# Patient Record
Sex: Female | Born: 1935 | Race: White | Hispanic: No | Smoking: Never smoker
Health system: Southern US, Community
[De-identification: ages and names within clinical notes are randomized; demographics above are authoritative.]

## PROBLEM LIST (undated history)

## (undated) DIAGNOSIS — M549 Dorsalgia, unspecified: Secondary | ICD-10-CM

## (undated) DIAGNOSIS — E0590 Thyrotoxicosis, unspecified without thyrotoxic crisis or storm: Secondary | ICD-10-CM

## (undated) DIAGNOSIS — I639 Cerebral infarction, unspecified: Secondary | ICD-10-CM

## (undated) DIAGNOSIS — G459 Transient cerebral ischemic attack, unspecified: Secondary | ICD-10-CM

## (undated) DIAGNOSIS — I350 Nonrheumatic aortic (valve) stenosis: Secondary | ICD-10-CM

## (undated) DIAGNOSIS — I10 Essential (primary) hypertension: Secondary | ICD-10-CM

## (undated) DIAGNOSIS — M810 Age-related osteoporosis without current pathological fracture: Secondary | ICD-10-CM

## (undated) DIAGNOSIS — J449 Chronic obstructive pulmonary disease, unspecified: Secondary | ICD-10-CM

## (undated) DIAGNOSIS — N259 Disorder resulting from impaired renal tubular function, unspecified: Secondary | ICD-10-CM

## (undated) DIAGNOSIS — R42 Dizziness and giddiness: Secondary | ICD-10-CM

## (undated) DIAGNOSIS — D649 Anemia, unspecified: Secondary | ICD-10-CM

## (undated) HISTORY — PX: BACK SURGERY: SHX140

## (undated) HISTORY — PX: OTHER SURGICAL HISTORY: SHX169

## (undated) HISTORY — DX: Essential (primary) hypertension: I10

## (undated) HISTORY — PX: SALIVARY GLAND SURGERY: SHX768

## (undated) HISTORY — DX: Dorsalgia, unspecified: M54.9

## (undated) HISTORY — DX: Thyrotoxicosis, unspecified without thyrotoxic crisis or storm: E05.90

## (undated) HISTORY — DX: Anemia, unspecified: D64.9

## (undated) HISTORY — DX: Disorder resulting from impaired renal tubular function, unspecified: N25.9

## (undated) HISTORY — DX: Dizziness and giddiness: R42

---

## 2000-06-21 ENCOUNTER — Encounter: Payer: Self-pay | Admitting: Emergency Medicine

## 2000-06-21 ENCOUNTER — Inpatient Hospital Stay (HOSPITAL_COMMUNITY): Admission: EM | Admit: 2000-06-21 | Discharge: 2000-06-24 | Payer: Self-pay | Admitting: Emergency Medicine

## 2000-06-22 ENCOUNTER — Encounter: Payer: Self-pay | Admitting: Family Medicine

## 2000-06-29 ENCOUNTER — Encounter: Payer: Self-pay | Admitting: *Deleted

## 2000-06-30 ENCOUNTER — Encounter: Payer: Self-pay | Admitting: Internal Medicine

## 2000-06-30 ENCOUNTER — Inpatient Hospital Stay (HOSPITAL_COMMUNITY): Admission: EM | Admit: 2000-06-30 | Discharge: 2000-07-02 | Payer: Self-pay | Admitting: *Deleted

## 2000-06-30 ENCOUNTER — Encounter: Payer: Self-pay | Admitting: Family Medicine

## 2001-05-17 ENCOUNTER — Ambulatory Visit (HOSPITAL_COMMUNITY): Admission: RE | Admit: 2001-05-17 | Discharge: 2001-05-17 | Payer: Self-pay | Admitting: Internal Medicine

## 2001-05-19 ENCOUNTER — Ambulatory Visit (HOSPITAL_COMMUNITY): Admission: RE | Admit: 2001-05-19 | Discharge: 2001-05-19 | Payer: Self-pay | Admitting: Cardiology

## 2001-05-19 ENCOUNTER — Encounter: Payer: Self-pay | Admitting: Cardiology

## 2001-05-21 ENCOUNTER — Encounter: Payer: Self-pay | Admitting: Cardiology

## 2001-05-21 ENCOUNTER — Ambulatory Visit (HOSPITAL_COMMUNITY): Admission: RE | Admit: 2001-05-21 | Discharge: 2001-05-21 | Payer: Self-pay | Admitting: Cardiology

## 2003-07-21 ENCOUNTER — Ambulatory Visit (HOSPITAL_COMMUNITY): Admission: RE | Admit: 2003-07-21 | Discharge: 2003-07-21 | Payer: Self-pay | Admitting: Family Medicine

## 2003-07-26 ENCOUNTER — Ambulatory Visit (HOSPITAL_COMMUNITY): Admission: RE | Admit: 2003-07-26 | Discharge: 2003-07-26 | Payer: Self-pay | Admitting: Family Medicine

## 2003-07-28 ENCOUNTER — Encounter (INDEPENDENT_AMBULATORY_CARE_PROVIDER_SITE_OTHER): Payer: Self-pay | Admitting: Specialist

## 2003-07-28 ENCOUNTER — Ambulatory Visit (HOSPITAL_COMMUNITY): Admission: RE | Admit: 2003-07-28 | Discharge: 2003-07-29 | Payer: Self-pay | Admitting: Interventional Radiology

## 2003-08-08 ENCOUNTER — Ambulatory Visit (HOSPITAL_COMMUNITY): Admission: RE | Admit: 2003-08-08 | Discharge: 2003-08-08 | Payer: Self-pay | Admitting: Interventional Radiology

## 2003-08-21 ENCOUNTER — Inpatient Hospital Stay (HOSPITAL_COMMUNITY): Admission: AD | Admit: 2003-08-21 | Discharge: 2003-08-22 | Payer: Self-pay | Admitting: Family Medicine

## 2004-03-11 ENCOUNTER — Ambulatory Visit (HOSPITAL_COMMUNITY): Admission: RE | Admit: 2004-03-11 | Discharge: 2004-03-11 | Payer: Self-pay | Admitting: Family Medicine

## 2004-03-11 ENCOUNTER — Ambulatory Visit (HOSPITAL_COMMUNITY): Admission: RE | Admit: 2004-03-11 | Discharge: 2004-03-11 | Payer: Self-pay | Admitting: Internal Medicine

## 2004-03-20 ENCOUNTER — Ambulatory Visit: Payer: Self-pay | Admitting: Internal Medicine

## 2004-03-28 ENCOUNTER — Ambulatory Visit: Payer: Self-pay | Admitting: Internal Medicine

## 2004-04-05 ENCOUNTER — Ambulatory Visit (HOSPITAL_COMMUNITY): Admission: RE | Admit: 2004-04-05 | Discharge: 2004-04-05 | Payer: Self-pay | Admitting: Internal Medicine

## 2004-04-05 ENCOUNTER — Ambulatory Visit: Payer: Self-pay | Admitting: Internal Medicine

## 2004-04-08 ENCOUNTER — Ambulatory Visit (HOSPITAL_COMMUNITY): Admission: RE | Admit: 2004-04-08 | Discharge: 2004-04-08 | Payer: Self-pay | Admitting: Internal Medicine

## 2004-10-06 ENCOUNTER — Inpatient Hospital Stay (HOSPITAL_COMMUNITY): Admission: EM | Admit: 2004-10-06 | Discharge: 2004-10-17 | Payer: Self-pay | Admitting: *Deleted

## 2004-10-07 ENCOUNTER — Ambulatory Visit: Payer: Self-pay | Admitting: Internal Medicine

## 2004-12-05 ENCOUNTER — Encounter (HOSPITAL_COMMUNITY): Admission: RE | Admit: 2004-12-05 | Discharge: 2005-01-04 | Payer: Self-pay | Admitting: Oncology

## 2004-12-05 ENCOUNTER — Encounter: Admission: RE | Admit: 2004-12-05 | Discharge: 2004-12-05 | Payer: Self-pay | Admitting: Oncology

## 2004-12-05 ENCOUNTER — Ambulatory Visit (HOSPITAL_COMMUNITY): Payer: Self-pay | Admitting: Nephrology

## 2005-01-15 ENCOUNTER — Ambulatory Visit (HOSPITAL_COMMUNITY): Admission: RE | Admit: 2005-01-15 | Discharge: 2005-01-15 | Payer: Self-pay | Admitting: Family Medicine

## 2005-01-30 ENCOUNTER — Encounter (HOSPITAL_COMMUNITY): Admission: RE | Admit: 2005-01-30 | Discharge: 2005-03-01 | Payer: Self-pay | Admitting: Nephrology

## 2005-02-25 ENCOUNTER — Encounter (HOSPITAL_COMMUNITY): Admission: RE | Admit: 2005-02-25 | Discharge: 2005-03-27 | Payer: Self-pay | Admitting: Family Medicine

## 2005-02-27 ENCOUNTER — Ambulatory Visit (HOSPITAL_COMMUNITY): Payer: Self-pay | Admitting: Nephrology

## 2005-03-24 ENCOUNTER — Ambulatory Visit (HOSPITAL_COMMUNITY): Admission: RE | Admit: 2005-03-24 | Discharge: 2005-03-24 | Payer: Self-pay | Admitting: Family Medicine

## 2005-03-24 ENCOUNTER — Ambulatory Visit: Payer: Self-pay | Admitting: Orthopedic Surgery

## 2005-03-27 ENCOUNTER — Encounter (HOSPITAL_COMMUNITY): Admission: RE | Admit: 2005-03-27 | Discharge: 2005-04-26 | Payer: Self-pay | Admitting: Oncology

## 2005-03-31 ENCOUNTER — Ambulatory Visit: Payer: Self-pay | Admitting: Orthopedic Surgery

## 2005-04-03 ENCOUNTER — Encounter (HOSPITAL_COMMUNITY): Admission: RE | Admit: 2005-04-03 | Discharge: 2005-05-03 | Payer: Self-pay | Admitting: Orthopedic Surgery

## 2005-04-17 ENCOUNTER — Ambulatory Visit: Payer: Self-pay | Admitting: Orthopedic Surgery

## 2005-04-24 ENCOUNTER — Ambulatory Visit (HOSPITAL_COMMUNITY): Payer: Self-pay | Admitting: Nephrology

## 2005-05-06 ENCOUNTER — Encounter (HOSPITAL_COMMUNITY): Admission: RE | Admit: 2005-05-06 | Discharge: 2005-06-05 | Payer: Self-pay | Admitting: Orthopedic Surgery

## 2005-05-19 ENCOUNTER — Encounter (HOSPITAL_COMMUNITY): Admission: RE | Admit: 2005-05-19 | Discharge: 2005-06-18 | Payer: Self-pay | Admitting: Oncology

## 2005-07-03 ENCOUNTER — Ambulatory Visit: Payer: Self-pay | Admitting: Orthopedic Surgery

## 2005-07-23 ENCOUNTER — Ambulatory Visit (HOSPITAL_COMMUNITY): Admission: RE | Admit: 2005-07-23 | Discharge: 2005-07-23 | Payer: Self-pay | Admitting: Internal Medicine

## 2005-07-25 ENCOUNTER — Emergency Department (HOSPITAL_COMMUNITY): Admission: EM | Admit: 2005-07-25 | Discharge: 2005-07-25 | Payer: Self-pay | Admitting: Emergency Medicine

## 2005-08-21 ENCOUNTER — Encounter (HOSPITAL_COMMUNITY): Admission: RE | Admit: 2005-08-21 | Discharge: 2005-09-20 | Payer: Self-pay | Admitting: Nephrology

## 2005-08-21 ENCOUNTER — Ambulatory Visit (HOSPITAL_COMMUNITY): Payer: Self-pay | Admitting: Nephrology

## 2006-01-09 ENCOUNTER — Ambulatory Visit (HOSPITAL_COMMUNITY): Payer: Self-pay | Admitting: Nephrology

## 2006-01-09 ENCOUNTER — Encounter (HOSPITAL_COMMUNITY): Admission: RE | Admit: 2006-01-09 | Discharge: 2006-01-26 | Payer: Self-pay | Admitting: Nephrology

## 2006-02-05 ENCOUNTER — Encounter (HOSPITAL_COMMUNITY): Admission: RE | Admit: 2006-02-05 | Discharge: 2006-03-07 | Payer: Self-pay | Admitting: Nephrology

## 2006-03-19 ENCOUNTER — Encounter (HOSPITAL_COMMUNITY): Admission: RE | Admit: 2006-03-19 | Discharge: 2006-04-18 | Payer: Self-pay | Admitting: Nephrology

## 2006-04-15 ENCOUNTER — Encounter (HOSPITAL_COMMUNITY): Admission: RE | Admit: 2006-04-15 | Discharge: 2006-05-15 | Payer: Self-pay | Admitting: Internal Medicine

## 2006-06-18 ENCOUNTER — Ambulatory Visit (HOSPITAL_COMMUNITY): Admission: RE | Admit: 2006-06-18 | Discharge: 2006-06-18 | Payer: Self-pay | Admitting: Internal Medicine

## 2006-06-18 ENCOUNTER — Ambulatory Visit (HOSPITAL_COMMUNITY): Payer: Self-pay | Admitting: Nephrology

## 2006-06-18 ENCOUNTER — Encounter (HOSPITAL_COMMUNITY): Admission: RE | Admit: 2006-06-18 | Discharge: 2006-07-18 | Payer: Self-pay | Admitting: Nephrology

## 2006-06-19 ENCOUNTER — Ambulatory Visit: Payer: Self-pay | Admitting: Cardiology

## 2006-08-12 ENCOUNTER — Encounter (HOSPITAL_COMMUNITY): Admission: RE | Admit: 2006-08-12 | Discharge: 2006-09-11 | Payer: Self-pay | Admitting: Nephrology

## 2006-08-12 ENCOUNTER — Ambulatory Visit (HOSPITAL_COMMUNITY): Payer: Self-pay | Admitting: Nephrology

## 2006-08-30 ENCOUNTER — Emergency Department (HOSPITAL_COMMUNITY): Admission: EM | Admit: 2006-08-30 | Discharge: 2006-08-30 | Payer: Self-pay | Admitting: Emergency Medicine

## 2006-10-01 ENCOUNTER — Ambulatory Visit: Payer: Self-pay | Admitting: Cardiology

## 2006-10-22 ENCOUNTER — Ambulatory Visit: Payer: Self-pay

## 2006-10-26 ENCOUNTER — Ambulatory Visit (HOSPITAL_COMMUNITY): Admission: RE | Admit: 2006-10-26 | Discharge: 2006-10-26 | Payer: Self-pay | Admitting: Internal Medicine

## 2006-11-06 ENCOUNTER — Encounter (HOSPITAL_COMMUNITY): Admission: RE | Admit: 2006-11-06 | Discharge: 2006-12-06 | Payer: Self-pay | Admitting: Oncology

## 2006-11-06 ENCOUNTER — Ambulatory Visit (HOSPITAL_COMMUNITY): Payer: Self-pay | Admitting: Oncology

## 2006-11-12 ENCOUNTER — Ambulatory Visit: Payer: Self-pay | Admitting: Cardiology

## 2006-12-03 ENCOUNTER — Ambulatory Visit (HOSPITAL_COMMUNITY): Payer: Self-pay | Admitting: Nephrology

## 2006-12-31 ENCOUNTER — Encounter (HOSPITAL_COMMUNITY): Admission: RE | Admit: 2006-12-31 | Discharge: 2007-01-27 | Payer: Self-pay | Admitting: Oncology

## 2007-02-02 ENCOUNTER — Encounter (HOSPITAL_COMMUNITY): Admission: RE | Admit: 2007-02-02 | Discharge: 2007-03-04 | Payer: Self-pay | Admitting: Nephrology

## 2007-02-02 ENCOUNTER — Ambulatory Visit (HOSPITAL_COMMUNITY): Payer: Self-pay | Admitting: Nephrology

## 2007-03-11 ENCOUNTER — Ambulatory Visit (HOSPITAL_COMMUNITY): Payer: Self-pay | Admitting: Family Medicine

## 2007-03-11 ENCOUNTER — Encounter (HOSPITAL_COMMUNITY): Admission: RE | Admit: 2007-03-11 | Discharge: 2007-04-10 | Payer: Self-pay | Admitting: Family Medicine

## 2007-05-28 ENCOUNTER — Ambulatory Visit (HOSPITAL_COMMUNITY): Payer: Self-pay | Admitting: Nephrology

## 2007-05-28 ENCOUNTER — Encounter (HOSPITAL_COMMUNITY): Admission: RE | Admit: 2007-05-28 | Discharge: 2007-06-27 | Payer: Self-pay | Admitting: Nephrology

## 2007-07-01 ENCOUNTER — Encounter (HOSPITAL_COMMUNITY): Admission: RE | Admit: 2007-07-01 | Discharge: 2007-07-31 | Payer: Self-pay | Admitting: Nephrology

## 2007-08-04 ENCOUNTER — Encounter (HOSPITAL_COMMUNITY): Admission: RE | Admit: 2007-08-04 | Discharge: 2007-09-03 | Payer: Self-pay | Admitting: Oncology

## 2007-08-04 ENCOUNTER — Ambulatory Visit (HOSPITAL_COMMUNITY): Payer: Self-pay | Admitting: Nephrology

## 2007-09-08 ENCOUNTER — Encounter (HOSPITAL_COMMUNITY): Admission: RE | Admit: 2007-09-08 | Discharge: 2007-10-08 | Payer: Self-pay | Admitting: Nephrology

## 2007-10-20 ENCOUNTER — Ambulatory Visit (HOSPITAL_COMMUNITY): Admission: RE | Admit: 2007-10-20 | Discharge: 2007-10-20 | Payer: Self-pay | Admitting: Anesthesiology

## 2007-10-28 ENCOUNTER — Ambulatory Visit (HOSPITAL_COMMUNITY): Payer: Self-pay | Admitting: Nephrology

## 2007-10-28 ENCOUNTER — Encounter (HOSPITAL_COMMUNITY): Admission: RE | Admit: 2007-10-28 | Discharge: 2007-11-27 | Payer: Self-pay | Admitting: Nephrology

## 2007-11-11 ENCOUNTER — Ambulatory Visit: Payer: Self-pay

## 2007-11-11 ENCOUNTER — Ambulatory Visit: Payer: Self-pay | Admitting: Cardiology

## 2007-12-09 ENCOUNTER — Encounter (HOSPITAL_COMMUNITY): Admission: RE | Admit: 2007-12-09 | Discharge: 2008-01-08 | Payer: Self-pay | Admitting: Nephrology

## 2008-02-02 ENCOUNTER — Encounter (HOSPITAL_COMMUNITY): Admission: RE | Admit: 2008-02-02 | Discharge: 2008-03-03 | Payer: Self-pay | Admitting: Nephrology

## 2008-02-02 ENCOUNTER — Ambulatory Visit (HOSPITAL_COMMUNITY): Payer: Self-pay | Admitting: Nephrology

## 2008-03-03 ENCOUNTER — Emergency Department (HOSPITAL_COMMUNITY): Admission: EM | Admit: 2008-03-03 | Discharge: 2008-03-03 | Payer: Self-pay | Admitting: Emergency Medicine

## 2008-03-08 ENCOUNTER — Inpatient Hospital Stay (HOSPITAL_COMMUNITY): Admission: AD | Admit: 2008-03-08 | Discharge: 2008-03-10 | Payer: Self-pay | Admitting: Internal Medicine

## 2008-05-25 ENCOUNTER — Ambulatory Visit (HOSPITAL_COMMUNITY): Payer: Self-pay | Admitting: Nephrology

## 2008-05-25 ENCOUNTER — Encounter (HOSPITAL_COMMUNITY): Admission: RE | Admit: 2008-05-25 | Discharge: 2008-06-24 | Payer: Self-pay | Admitting: Nephrology

## 2008-07-07 ENCOUNTER — Encounter (HOSPITAL_COMMUNITY): Admission: RE | Admit: 2008-07-07 | Discharge: 2008-08-06 | Payer: Self-pay | Admitting: Nephrology

## 2008-07-25 ENCOUNTER — Ambulatory Visit (HOSPITAL_COMMUNITY): Admission: RE | Admit: 2008-07-25 | Discharge: 2008-07-25 | Payer: Self-pay | Admitting: Internal Medicine

## 2008-08-17 ENCOUNTER — Encounter (HOSPITAL_COMMUNITY): Admission: RE | Admit: 2008-08-17 | Discharge: 2008-09-16 | Payer: Self-pay | Admitting: Nephrology

## 2008-08-17 ENCOUNTER — Ambulatory Visit (HOSPITAL_COMMUNITY): Payer: Self-pay | Admitting: Nephrology

## 2008-09-08 ENCOUNTER — Encounter (INDEPENDENT_AMBULATORY_CARE_PROVIDER_SITE_OTHER): Payer: Self-pay | Admitting: *Deleted

## 2008-09-28 ENCOUNTER — Encounter (HOSPITAL_COMMUNITY): Admission: RE | Admit: 2008-09-28 | Discharge: 2008-10-25 | Payer: Self-pay | Admitting: Nephrology

## 2008-11-21 ENCOUNTER — Ambulatory Visit (HOSPITAL_COMMUNITY): Admission: RE | Admit: 2008-11-21 | Discharge: 2008-11-21 | Payer: Self-pay | Admitting: Internal Medicine

## 2008-11-23 ENCOUNTER — Ambulatory Visit (HOSPITAL_COMMUNITY): Payer: Self-pay | Admitting: Nephrology

## 2008-11-23 ENCOUNTER — Encounter (HOSPITAL_COMMUNITY): Admission: RE | Admit: 2008-11-23 | Discharge: 2008-12-23 | Payer: Self-pay | Admitting: Nephrology

## 2008-12-07 ENCOUNTER — Encounter: Payer: Self-pay | Admitting: Cardiology

## 2008-12-07 ENCOUNTER — Ambulatory Visit (HOSPITAL_COMMUNITY): Admission: RE | Admit: 2008-12-07 | Discharge: 2008-12-07 | Payer: Self-pay | Admitting: Cardiology

## 2008-12-08 ENCOUNTER — Encounter (INDEPENDENT_AMBULATORY_CARE_PROVIDER_SITE_OTHER): Payer: Self-pay | Admitting: *Deleted

## 2008-12-19 ENCOUNTER — Ambulatory Visit: Payer: Self-pay | Admitting: Cardiology

## 2008-12-19 ENCOUNTER — Encounter: Payer: Self-pay | Admitting: Cardiology

## 2008-12-19 ENCOUNTER — Ambulatory Visit (HOSPITAL_COMMUNITY): Admission: RE | Admit: 2008-12-19 | Discharge: 2008-12-19 | Payer: Self-pay | Admitting: Cardiology

## 2008-12-19 DIAGNOSIS — R42 Dizziness and giddiness: Secondary | ICD-10-CM

## 2008-12-19 DIAGNOSIS — E039 Hypothyroidism, unspecified: Secondary | ICD-10-CM

## 2008-12-19 DIAGNOSIS — N259 Disorder resulting from impaired renal tubular function, unspecified: Secondary | ICD-10-CM | POA: Insufficient documentation

## 2008-12-19 DIAGNOSIS — I359 Nonrheumatic aortic valve disorder, unspecified: Secondary | ICD-10-CM

## 2008-12-19 DIAGNOSIS — M549 Dorsalgia, unspecified: Secondary | ICD-10-CM | POA: Insufficient documentation

## 2008-12-19 DIAGNOSIS — D649 Anemia, unspecified: Secondary | ICD-10-CM | POA: Insufficient documentation

## 2008-12-19 DIAGNOSIS — I10 Essential (primary) hypertension: Secondary | ICD-10-CM | POA: Insufficient documentation

## 2008-12-22 ENCOUNTER — Ambulatory Visit: Payer: Self-pay | Admitting: Cardiology

## 2008-12-22 DIAGNOSIS — I6529 Occlusion and stenosis of unspecified carotid artery: Secondary | ICD-10-CM

## 2009-01-15 ENCOUNTER — Encounter (HOSPITAL_COMMUNITY): Admission: RE | Admit: 2009-01-15 | Discharge: 2009-01-24 | Payer: Self-pay | Admitting: Oncology

## 2009-01-15 ENCOUNTER — Ambulatory Visit (HOSPITAL_COMMUNITY): Payer: Self-pay | Admitting: Internal Medicine

## 2009-02-28 ENCOUNTER — Ambulatory Visit (HOSPITAL_COMMUNITY): Payer: Self-pay | Admitting: Nephrology

## 2009-02-28 ENCOUNTER — Encounter (HOSPITAL_COMMUNITY): Admission: RE | Admit: 2009-02-28 | Discharge: 2009-03-30 | Payer: Self-pay | Admitting: Oncology

## 2009-04-17 ENCOUNTER — Encounter (HOSPITAL_COMMUNITY): Admission: RE | Admit: 2009-04-17 | Discharge: 2009-05-17 | Payer: Self-pay | Admitting: Nephrology

## 2009-04-17 ENCOUNTER — Ambulatory Visit (HOSPITAL_COMMUNITY): Payer: Self-pay | Admitting: Nephrology

## 2009-05-29 ENCOUNTER — Encounter (HOSPITAL_COMMUNITY): Admission: RE | Admit: 2009-05-29 | Discharge: 2009-06-28 | Payer: Self-pay | Admitting: Internal Medicine

## 2009-05-29 ENCOUNTER — Ambulatory Visit (HOSPITAL_COMMUNITY): Payer: Self-pay | Admitting: Internal Medicine

## 2009-07-18 ENCOUNTER — Ambulatory Visit (HOSPITAL_COMMUNITY): Payer: Self-pay | Admitting: Nephrology

## 2009-07-18 ENCOUNTER — Encounter (HOSPITAL_COMMUNITY): Admission: RE | Admit: 2009-07-18 | Discharge: 2009-08-17 | Payer: Self-pay | Admitting: Nephrology

## 2009-08-31 ENCOUNTER — Encounter (HOSPITAL_COMMUNITY): Admission: RE | Admit: 2009-08-31 | Discharge: 2009-09-30 | Payer: Self-pay | Admitting: Nephrology

## 2009-10-12 ENCOUNTER — Ambulatory Visit (HOSPITAL_COMMUNITY): Payer: Self-pay | Admitting: Nephrology

## 2009-10-12 ENCOUNTER — Encounter (HOSPITAL_COMMUNITY)
Admission: RE | Admit: 2009-10-12 | Discharge: 2009-10-26 | Payer: Self-pay | Source: Home / Self Care | Admitting: Nephrology

## 2009-11-23 ENCOUNTER — Encounter (HOSPITAL_COMMUNITY)
Admission: RE | Admit: 2009-11-23 | Discharge: 2009-12-23 | Payer: Self-pay | Source: Home / Self Care | Admitting: Nephrology

## 2009-12-28 ENCOUNTER — Ambulatory Visit: Payer: Self-pay | Admitting: Cardiology

## 2010-01-02 ENCOUNTER — Encounter: Payer: Self-pay | Admitting: Cardiology

## 2010-01-04 ENCOUNTER — Ambulatory Visit (HOSPITAL_COMMUNITY): Payer: Self-pay | Admitting: Nephrology

## 2010-01-04 ENCOUNTER — Encounter (HOSPITAL_COMMUNITY)
Admission: RE | Admit: 2010-01-04 | Discharge: 2010-02-03 | Payer: Self-pay | Source: Home / Self Care | Attending: Nephrology | Admitting: Nephrology

## 2010-02-15 ENCOUNTER — Encounter (HOSPITAL_COMMUNITY)
Admission: RE | Admit: 2010-02-15 | Discharge: 2010-02-26 | Payer: Self-pay | Source: Home / Self Care | Attending: Nephrology | Admitting: Nephrology

## 2010-02-17 ENCOUNTER — Encounter: Payer: Self-pay | Admitting: Cardiology

## 2010-02-17 ENCOUNTER — Encounter: Payer: Self-pay | Admitting: Interventional Radiology

## 2010-02-18 LAB — RENAL FUNCTION PANEL
Calcium: 10 mg/dL (ref 8.4–10.5)
GFR calc Af Amer: 34 mL/min — ABNORMAL LOW (ref >60)
GFR calc non Af Amer: 28 mL/min — ABNORMAL LOW (ref >60)
Phosphorus: 3.8 mg/dL (ref 2.3–4.6)
Sodium: 139 mEq/L (ref 135–145)

## 2010-02-18 LAB — HEMOGLOBIN AND HEMATOCRIT, BLOOD: Hemoglobin: 11.3 g/dL — ABNORMAL LOW (ref 12.0–15.0)

## 2010-02-26 NOTE — Letter (Signed)
Summary: dr hall records  dr hall records   Imported By: Faythe Ghee 01/02/2010 11:05:42  _____________________________________________________________________  External Attachment:    Type:   Image     Comment:   External Document

## 2010-02-26 NOTE — Assessment & Plan Note (Signed)
Summary: F1Y   Visit Type:  Follow-up Primary Provider:  Sabino Snipes  CC:  no cardiology complaints.  History of Present Illness: [name] [name] comes in with her daughter today for evaluation and management of her moderate aortic stenosis and carotid artery disease.  She is 77 years of age. He continues to do remarkably well. She lives with her daughter. She denies any dyspnea on exertion when she climbs stairs. She's had no presyncope or syncope. She's had no chest pain or angina. She denies any edema. He's had no palpitations. She denies any symptoms of TIAs or mini strokes.  Her blood pressure meds were just increased recently by Dr. Margo Aye. She denies any dizziness with standing.  Her last echocardiogram was November of 2010. EF was 55%, moderate hypokinesia of the basal posterior myocardium, moderately calcified aortic annulus and moderately thickened and calcified leaflets. There was moderate restriction. He was felt to represent mild to moderate aortic stenosis. There was mild mitral regurgitation. There was no sign of pulmonary hypertension.  Carotid Dopplers were stable last year. She continues on low-dose aspirin.  Current Medications (verified): 1)  Synthroid 50 Mcg Tabs (Levothyroxine Sodium) .... Once Daily 2)  Zoloft 50 Mg Tabs (Sertraline Hcl) .... Once Daily 3)  Aspirin 81 Mg Tbec (Aspirin) .... Take One Tablet By Mouth Daily 4)  Vitamin C 500 Mg  Tabs (Ascorbic Acid) .... Once Daily 5)  Colace 100 Mg Caps (Docusate Sodium) .... Two Times A Day 6)  Multivitamins   Tabs (Multiple Vitamin) .... Once Daily 7)  Hydrochlorothiazide 12.5 Mg Tabs (Hydrochlorothiazide) .... Take One Tablet By Mouth Daily. 8)  Miralax  Powd (Polyethylene Glycol 3350) .... Once Daily 9)  Ocuvite-Lutein  Caps (Multiple Vitamins-Minerals) .... Once Daily 10)  Calcium Carbonate-Vitamin D 600-400 Mg-Unit  Tabs (Calcium Carbonate-Vitamin D) .... Once Daily 11)  Metoprolol Succinate 100 Mg Xr24h-Tab  (Metoprolol Succinate) .... Take 1 Tab Daily 12)  Amlodipine Besylate 10 Mg Tabs (Amlodipine Besylate) .... Take 1 Tab Daily 13)  Acetaminophen 500 Mg  Caps (Acetaminophen) .... As Needed 14)  Ultram 50 Mg Tabs (Tramadol Hcl) .... 1/2 As Needed  Allergies: No Known Drug Allergies  Comments:  Nurse/Medical Assistant: patients daughter reviewed med list from previous ov and stated that all meds are the same and her primary m.d has changed [facility] per daughter amlodipine changed from 5 mg to 10mg  daily and metoprolol from 25mg  daily to 100 mg daily.  Clinical Reports Reviewed:  Carotid Doppler:  12/07/2008:   IMPRESSION:   Plaque formation bilaterally in the carotid systems.    Turbulent blood flow is seen bilaterally, more pronounced on the   left especially at the distal left CCA, which could account for a   carotid bruit on exam.   No definite evidence of hemodynamically significant stenosis by   velocity measurements.    Read By:  Lollie Marrow,  M.D.   Released By:  Lollie Marrow,  M.D.  Additional Information  HL7 RESULT STATUS : F  08/21/2003:   Clinical data:   Hypertension; stroke.   US CAROTID DUPLEX BILATERAL   Real-time, color flow Doppler and  duplex Doppler evaluation of the   carotid and vertebral arteries was performed.  Plaque formation in   the distal right common carotid artery, both carotid bulbs, both   proximal internal carotid arteries, and both proximal external   carotid arteries.  The following peak systolic velocities (cm/second)   were  obtained:                                 RIGHT        LEFT   CCA                       32               69   ICA                         56               39   ECA                        98               32   ICA/CCA RATIO    1.76           0.56   Normal antegrade flow is demonstrated in both vertebral arteries.   IMPRESSION   Bilateral carotid plaque formation, as described  above.  No   significant luminal narrowing or flow reduction.    Read By:  Darrol Angel,  M.D.   Released By:  Darrol Angel,  M.D.  Additional Information  External image : [id number]   Review of Systems       negative other than history of present illness  Vital Signs:  Patient profile:   77 year old female Weight:      105 pounds BMI:     28.49 O2 Sat:      92 % on Room air Pulse rate:   66 / minute BP sitting:   151 / 72  (right arm)  Vitals Entered By: Dreama Saa, CNA (December 28, 2009 10:46 AM)  O2 Flow:  Room air  Physical Exam  General:  elderly, no acute distress, good hearing and sharp mentally Head:  normocephalic and atraumatic Eyes:  glasses otherwise normal Neck:  Neck supple, no JVD. No masses, thyromegaly or abnormal cervical nodes. Lungs:  Clear bilaterally to auscultation and percussion. Heart:  PMI nondisplaced, soft S1-S2 splits. Soft systolic murmur. No gallop, soft carotid bruits Msk:  Back normal, normal gait. Muscle strength and tone normal. Pulses:  pulses normal in all 4 extremities Extremities:  No clubbing or cyanosis. Neurologic:  Alert and oriented x 3. Skin:  Intact without lesions or rashes. Psych:  Normal affect.   Impression & Recommendations:  Problem # 1:  CAROTID ARTERY STENOSIS, WITHOUT INFARCTION (ICD-433.10) Assessment Unchanged continue medical therapy Her updated medication list for this problem includes:    Aspirin 81 Mg Tbec (Aspirin) .Marland Kitchen... Take one tablet by mouth daily  Problem # 2:  AORTIC VALVE DISEASE (ICD-424.1) Assessment: Unchanged continue medical therapy. Patient and daughter advised if she has increased dyspnea on exertion, angina, or presyncope or syncope to notify us. Otherwise we'll see her back in a year. Her updated medication list for this problem includes:    Hydrochlorothiazide 12.5 Mg Tabs (Hydrochlorothiazide) .Marland Kitchen... Take one tablet by mouth daily.    Metoprolol Succinate 100 Mg  Xr24h-tab (Metoprolol succinate) .Marland Kitchen... Take 1 tab daily  Patient Instructions: 1)  Your physician recommends that you schedule a follow-up appointment in: 1 year 2)  Your physician recommends that you continue on your current medications as  directed. Please refer to the Current Medication list given to you today.

## 2010-03-12 ENCOUNTER — Ambulatory Visit (HOSPITAL_COMMUNITY): Payer: Medicare Other

## 2010-03-13 ENCOUNTER — Ambulatory Visit (HOSPITAL_COMMUNITY): Payer: Self-pay

## 2010-03-29 ENCOUNTER — Ambulatory Visit (HOSPITAL_COMMUNITY): Payer: Self-pay

## 2010-04-08 LAB — RENAL FUNCTION PANEL
BUN: 23 mg/dL (ref 6–23)
CO2: 30 mEq/L (ref 19–32)
Chloride: 100 mEq/L (ref 96–112)
Creatinine, Ser: 1.85 mg/dL — ABNORMAL HIGH (ref 0.4–1.2)
GFR calc non Af Amer: 25 mL/min — ABNORMAL LOW (ref >60)
Potassium: 3.9 mEq/L (ref 3.5–5.1)

## 2010-04-10 LAB — RENAL FUNCTION PANEL
Albumin: 3.8 g/dL (ref 3.5–5.2)
CO2: 29 mEq/L (ref 19–32)
Calcium: 10 mg/dL (ref 8.4–10.5)
Chloride: 100 mEq/L (ref 96–112)
GFR calc Af Amer: 30 mL/min — ABNORMAL LOW (ref >60)
GFR calc non Af Amer: 24 mL/min — ABNORMAL LOW (ref >60)
Sodium: 139 mEq/L (ref 135–145)

## 2010-04-10 LAB — HEMOGLOBIN AND HEMATOCRIT, BLOOD: HCT: 32.1 % — ABNORMAL LOW (ref 36.0–46.0)

## 2010-04-11 LAB — HEMOGLOBIN AND HEMATOCRIT, BLOOD: Hemoglobin: 10.7 g/dL — ABNORMAL LOW (ref 12.0–15.0)

## 2010-04-11 LAB — RENAL FUNCTION PANEL
CO2: 32 mEq/L (ref 19–32)
Calcium: 10.1 mg/dL (ref 8.4–10.5)
Creatinine, Ser: 1.8 mg/dL — ABNORMAL HIGH (ref 0.4–1.2)
Glucose, Bld: 92 mg/dL (ref 70–99)
Phosphorus: 3.7 mg/dL (ref 2.3–4.6)
Sodium: 139 mEq/L (ref 135–145)

## 2010-04-12 LAB — RENAL FUNCTION PANEL
BUN: 24 mg/dL — ABNORMAL HIGH (ref 6–23)
CO2: 30 mEq/L (ref 19–32)
Chloride: 101 mEq/L (ref 96–112)
Glucose, Bld: 97 mg/dL (ref 70–99)
Potassium: 3.8 mEq/L (ref 3.5–5.1)
Sodium: 137 mEq/L (ref 135–145)

## 2010-04-12 LAB — HEMOGLOBIN AND HEMATOCRIT, BLOOD: Hemoglobin: 10.6 g/dL — ABNORMAL LOW (ref 12.0–15.0)

## 2010-04-14 LAB — RENAL FUNCTION PANEL
Albumin: 3.7 g/dL (ref 3.5–5.2)
BUN: 27 mg/dL — ABNORMAL HIGH (ref 6–23)
Chloride: 101 mEq/L (ref 96–112)
GFR calc Af Amer: 29 mL/min — ABNORMAL LOW (ref >60)
GFR calc non Af Amer: 24 mL/min — ABNORMAL LOW (ref >60)
Phosphorus: 3.5 mg/dL (ref 2.3–4.6)
Potassium: 3.8 mEq/L (ref 3.5–5.1)
Sodium: 139 mEq/L (ref 135–145)

## 2010-04-14 LAB — HEMOGLOBIN AND HEMATOCRIT, BLOOD
HCT: 30.7 % — ABNORMAL LOW (ref 36.0–46.0)
Hemoglobin: 10.3 g/dL — ABNORMAL LOW (ref 12.0–15.0)

## 2010-04-16 LAB — RENAL FUNCTION PANEL
Albumin: 3.5 g/dL (ref 3.5–5.2)
BUN: 25 mg/dL — ABNORMAL HIGH (ref 6–23)
Calcium: 9.5 mg/dL (ref 8.4–10.5)
Chloride: 102 mEq/L (ref 96–112)
Creatinine, Ser: 1.9 mg/dL — ABNORMAL HIGH (ref 0.4–1.2)
GFR calc Af Amer: 30 mL/min — ABNORMAL LOW (ref >60)
GFR calc non Af Amer: 25 mL/min — ABNORMAL LOW (ref >60)

## 2010-04-16 LAB — HEMOGLOBIN AND HEMATOCRIT, BLOOD: HCT: 29.5 % — ABNORMAL LOW (ref 36.0–46.0)

## 2010-04-23 ENCOUNTER — Other Ambulatory Visit (HOSPITAL_COMMUNITY): Payer: Medicare Other

## 2010-04-23 ENCOUNTER — Ambulatory Visit (HOSPITAL_COMMUNITY): Payer: Medicare Other

## 2010-04-29 LAB — RENAL FUNCTION PANEL
CO2: 29 mEq/L (ref 19–32)
Chloride: 101 mEq/L (ref 96–112)
Creatinine, Ser: 1.85 mg/dL — ABNORMAL HIGH (ref 0.4–1.2)
GFR calc Af Amer: 31 mL/min — ABNORMAL LOW (ref >60)
GFR calc non Af Amer: 25 mL/min — ABNORMAL LOW (ref >60)
Glucose, Bld: 123 mg/dL — ABNORMAL HIGH (ref 70–99)
Sodium: 138 mEq/L (ref 135–145)

## 2010-05-03 LAB — RENAL FUNCTION PANEL
Albumin: 3.7 g/dL (ref 3.5–5.2)
CO2: 32 mEq/L (ref 19–32)
Chloride: 100 mEq/L (ref 96–112)
GFR calc Af Amer: 30 mL/min — ABNORMAL LOW (ref >60)
GFR calc non Af Amer: 25 mL/min — ABNORMAL LOW (ref >60)
Potassium: 3.8 mEq/L (ref 3.5–5.1)

## 2010-05-03 LAB — HEMOGLOBIN AND HEMATOCRIT, BLOOD: HCT: 31.4 % — ABNORMAL LOW (ref 36.0–46.0)

## 2010-05-05 LAB — HEMOGLOBIN AND HEMATOCRIT, BLOOD
HCT: 29.5 % — ABNORMAL LOW (ref 36.0–46.0)
Hemoglobin: 10.3 g/dL — ABNORMAL LOW (ref 12.0–15.0)

## 2010-05-05 LAB — RENAL FUNCTION PANEL
CO2: 30 mEq/L (ref 19–32)
GFR calc non Af Amer: 27 mL/min — ABNORMAL LOW (ref >60)
Glucose, Bld: 105 mg/dL — ABNORMAL HIGH (ref 70–99)
Phosphorus: 3.4 mg/dL (ref 2.3–4.6)
Potassium: 3.4 mEq/L — ABNORMAL LOW (ref 3.5–5.1)
Sodium: 134 mEq/L — ABNORMAL LOW (ref 135–145)

## 2010-05-07 ENCOUNTER — Encounter (HOSPITAL_COMMUNITY): Payer: Medicare Other | Attending: Internal Medicine

## 2010-05-07 ENCOUNTER — Encounter (HOSPITAL_COMMUNITY): Payer: Medicare Other

## 2010-05-07 DIAGNOSIS — N189 Chronic kidney disease, unspecified: Secondary | ICD-10-CM | POA: Insufficient documentation

## 2010-05-07 DIAGNOSIS — D631 Anemia in chronic kidney disease: Secondary | ICD-10-CM | POA: Insufficient documentation

## 2010-05-07 DIAGNOSIS — I129 Hypertensive chronic kidney disease with stage 1 through stage 4 chronic kidney disease, or unspecified chronic kidney disease: Secondary | ICD-10-CM | POA: Insufficient documentation

## 2010-05-13 LAB — RENAL FUNCTION PANEL
CO2: 29 mEq/L (ref 19–32)
Chloride: 102 mEq/L (ref 96–112)
Creatinine, Ser: 1.83 mg/dL — ABNORMAL HIGH (ref 0.4–1.2)
GFR calc Af Amer: 31 mL/min — ABNORMAL LOW (ref >60)
GFR calc non Af Amer: 26 mL/min — ABNORMAL LOW (ref >60)
Potassium: 3.9 mEq/L (ref 3.5–5.1)

## 2010-05-13 LAB — CBC
HCT: 29.2 % — ABNORMAL LOW (ref 36.0–46.0)
MCV: 91.1 fL (ref 78.0–100.0)
RBC: 3.21 MIL/uL — ABNORMAL LOW (ref 3.87–5.11)
WBC: 5.1 10*3/uL (ref 4.0–10.5)

## 2010-05-14 LAB — DIFFERENTIAL
Basophils Absolute: 0 10*3/uL (ref 0.0–0.1)
Basophils Relative: 0 % (ref 0–1)
Basophils Relative: 0 % (ref 0–1)
Lymphocytes Relative: 16 % (ref 12–46)
Lymphocytes Relative: 8 % — ABNORMAL LOW (ref 12–46)
Lymphs Abs: 1 10*3/uL (ref 0.7–4.0)
Monocytes Absolute: 0.8 10*3/uL (ref 0.1–1.0)
Monocytes Absolute: 0.9 10*3/uL (ref 0.1–1.0)
Monocytes Relative: 15 % — ABNORMAL HIGH (ref 3–12)
Neutro Abs: 3.9 10*3/uL (ref 1.7–7.7)
Neutro Abs: 5.9 10*3/uL (ref 1.7–7.7)
Neutrophils Relative %: 66 % (ref 43–77)
Neutrophils Relative %: 80 % — ABNORMAL HIGH (ref 43–77)

## 2010-05-14 LAB — COMPREHENSIVE METABOLIC PANEL
Albumin: 3.4 g/dL — ABNORMAL LOW (ref 3.5–5.2)
Alkaline Phosphatase: 79 U/L (ref 39–117)
BUN: 27 mg/dL — ABNORMAL HIGH (ref 6–23)
Chloride: 95 mEq/L — ABNORMAL LOW (ref 96–112)
Creatinine, Ser: 2.43 mg/dL — ABNORMAL HIGH (ref 0.4–1.2)
Glucose, Bld: 122 mg/dL — ABNORMAL HIGH (ref 70–99)
Potassium: 4.1 mEq/L (ref 3.5–5.1)
Total Bilirubin: 0.5 mg/dL (ref 0.3–1.2)
Total Protein: 6.8 g/dL (ref 6.0–8.3)

## 2010-05-14 LAB — URINALYSIS, ROUTINE W REFLEX MICROSCOPIC
Bilirubin Urine: NEGATIVE
Glucose, UA: NEGATIVE mg/dL
Hgb urine dipstick: NEGATIVE
Nitrite: NEGATIVE
Specific Gravity, Urine: 1.02 (ref 1.005–1.030)
Specific Gravity, Urine: <1.005 — ABNORMAL LOW (ref 1.005–1.030)
Urobilinogen, UA: 0.2 mg/dL (ref 0.0–1.0)
pH: 6.5 (ref 5.0–8.0)
pH: 6.5 (ref 5.0–8.0)

## 2010-05-14 LAB — CBC
HCT: 29.7 % — ABNORMAL LOW (ref 36.0–46.0)
Hemoglobin: 10.3 g/dL — ABNORMAL LOW (ref 12.0–15.0)
Hemoglobin: 9 g/dL — ABNORMAL LOW (ref 12.0–15.0)
MCHC: 34.1 g/dL (ref 30.0–36.0)
MCV: 91.2 fL (ref 78.0–100.0)
Platelets: 280 10*3/uL (ref 150–400)
RBC: 2.86 MIL/uL — ABNORMAL LOW (ref 3.87–5.11)
RDW: 14.3 % (ref 11.5–15.5)
WBC: 5.9 10*3/uL (ref 4.0–10.5)

## 2010-05-14 LAB — BASIC METABOLIC PANEL
Calcium: 8.9 mg/dL (ref 8.4–10.5)
Creatinine, Ser: 2.14 mg/dL — ABNORMAL HIGH (ref 0.4–1.2)
GFR calc Af Amer: 26 mL/min — ABNORMAL LOW (ref >60)
GFR calc non Af Amer: 22 mL/min — ABNORMAL LOW (ref >60)
Sodium: 138 mEq/L (ref 135–145)

## 2010-05-14 LAB — CULTURE, BLOOD (ROUTINE X 2)
Report Status: 2152010
Report Status: 2152010

## 2010-05-14 LAB — URINE MICROSCOPIC-ADD ON

## 2010-05-14 LAB — TSH: TSH: 3.277 u[IU]/mL (ref 0.350–4.500)

## 2010-06-11 NOTE — Assessment & Plan Note (Signed)
[facility]                            CARDIOLOGY OFFICE NOTE   NAME:[name] [name], [name] [name]                        MRN:          [id number]  DATE:10/01/2006                            DOB:          1914-04-23    REFERRING PHYSICIAN:  Patrica Duel, M.D.   REASON FOR CONSULTATION:  I was asked to consult on [name] [name] by Dr.  Patrica Duel for labile hypertension and difficult to control  hypertension.   HISTORY OF PRESENT ILLNESS:  [name] [name] is an 77 year old, widowed, [place]  female who I have seen in the past. 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 Loraine Leriche [employer].   [name] [name] is a very vivacious 77 year old lady who carries a diagnosis  of hypertension and mild aortic stenosis.   Her blood pressure has been under good control some of the time but then  she will go periods where she has systolic blood pressures above 160 on  a routine basis. This is particularly true at night. It causes a  supratentorial headache which is very uncomfortable. It feels better  when she lays down. Her current regimen is  Toprol XL 25 mg in the morning, 12.5 in the evening (when she took  Toprol 50 mg at bedtime she had profound headaches), Norvasc 5 mg p.o.  b.i.d. (10 mg once a day did not work or carry throughout the 24-hour  period according to her daughter), HCTZ 12.5 mg p.o. daily.  Unfortunately Benicar was used but her creatinine increased greater than  2. She has a GFR of around 30 according to recent work. Her baseline  creatinine runs between 1.5 and 1.8.   She has a history of mild aortic stenosis diagnosed in 2005 by 2-D echo.  Her last echocardiogram read by Dr. Donnamarie Rossetti Jun 19, 2006 showed  normal left ventricular function, mild left ventricular hypertrophy, no  segmental wall motion abnormalities, EF 55%, moderate aortic valve  sclerosis with mild stenosis and insufficiency by Doppler, normal right  ventricular size and function, mild right  ventricular hypertrophy,  normal right and left atrium size. She really had minimal hypertrophy by  measurements.   PAST MEDICAL HISTORY:  She has no history of dye reaction. She is  intolerant of PENICILLIN, MACROBID, and ALOE VERA MEDICATIONS.   She has a history of hypertension for a number of years. She was seen in  the emergency room earlier this month for vertigo which was diagnosed as  benign positional vertigo.   She does not smoke and does not use recreational products or alcoholic  beverages. She drinks very little caffeine.   PAST SURGICAL HISTORY:  Small-bowel obstruction in 2006, salivary duct  surgery in 1973.   CURRENT MEDICATIONS:  1. Aspirin 81 mg a day.  2. Zoloft 50 mg a day.  3. Norvasc 5 mg p.o. b.i.d.  4. Toprol XL 50 in the morning, 12.5 in the evening.  5. HCTZ 12.5 daily.  6. Calcium with vitamin D.  7. Boniva monthly.  8. Vitamin C.  9. Multivite.  10.Stool softener.  11.Synthroid 50 mcg  a day.  12.Xalatan eyedrops.  13.Optivite.  14.She also takes Procrit shots p.r.n.   FAMILY HISTORY:  Noncontributory.   SOCIAL HISTORY:  She is retired. She is living with her daughter at  present.   PHYSICAL EXAMINATION:  GENERAL:  She is extremely pleasant. She looks  younger than stated age. Her skin is immaculate.  VITAL SIGNS:  Blood pressure is 140/68, her pulse is 70 and she is in  sinus rhythm with some left ventricular hypertrophy on EKG. Her height  is 5 foot, 118 pounds.  HEENT:  Normocephalic, atraumatic. PERRLA. Extraocular movements intact.  Sclera clear. Facial symmetry is normal.  NECK:  Supple. Carotid upstrokes reveal bilateral referred sounds versus  bruit. Thyroid is not enlarged, trachea is midline.  LUNGS:  Clear.  HEART:  Reveals a soft systolic murmur that sounds like mild aortic  stenosis. S2 does split. There is no gallop. There is an S4 at the apex.  ABDOMEN:  Soft with good bowel sounds, no midline or flank bruit was  heard with  careful auscultation.  EXTREMITIES:  Reveal varicose veins. There is no edema. Pulses are  intact.  NEUROLOGIC:  Intact.  SKIN:  Unremarkable.   ASSESSMENT:  1. Essential labile hypertension.  2. Mild aortic stenosis.  3. Chronic renal insufficiency.  4. Intolerance to Benicar with increased creatinine. Thus ACE      inhibitors and ARBs are out for blood pressure control. This brings      up the possibility of renal artery stenosis.   I have had a long talk with [name] [name] and [name] [name]. I have recommended an  alpha blocker as our next step. Will start with 0.1 mg p.o. b.i.d. of  clonidine. I think our goal blood pressure is an average of about 140  with a range of 130-160. Her diastolics do not seem to be a problem.   I am going to get her to come back in 2 weeks and bring her recorded  blood pressures. If we cannot control it with the titration of  clonidine, we may need to do a renal artery scan. Hopefully we can  control this with conservative measures.   She also probably needs carotid Dopplers on return. We need to rule out  carotid artery stenosis versus referred murmur from her valve.     Thomas C. Daleen Squibb, MD, [facility]  Electronically Signed    TCW/MedQ  DD: 10/01/2006  DT: 10/01/2006  Job #: [id number]   cc:   Patrica Duel, M.D.

## 2010-06-11 NOTE — Group Therapy Note (Signed)
NAME:  [name] [name], Cecile                 ACCOUNT NO.:  [id number]   MEDICAL RECORD NO.:  [id number]          PATIENT TYPE:  INP   LOCATION:  A335                          FACILITY:  [facility]   PHYSICIAN:  Catalina Pizza, M.D.        DATE OF BIRTH:  10/14/1914   DATE OF PROCEDURE:  03/09/2008  DATE OF DISCHARGE:                                 PROGRESS NOTE   SUBJECTIVE:  Ms. Norell is a 77 year old who was admitted with altered  mental status, hallucinations, and low-grade temperature of unknown  source.  Apparently, her daughter stayed with her last night and she did  have continued hallucinations, not severe last night but apparently went  to sleep and woke up this morning and was felt back to her baseline.  She did have a large bowel movement which she got considerable relief  with her abdominal pain.  She is still having some lower back pain which  would go along with her multiple compression fractures.  She denies any  problems with her breathing, state that she did eat some this morning  and at this time states she is ready to go home.   PHYSICAL EXAMINATION:  VITAL SIGNS:  Temperature is 98.6, blood pressure  166/71, pulse 67, respirations 20, sat 97% on room air.  GENERAL:  This is an elderly female lying on bed in no acute distress.  HEENT:  Unremarkable.  LUNGS:  Clear to auscultation bilaterally.  HEART:  Regular rate and rhythm with 3/6 systolic AS-type murmur.  ABDOMEN:  Less tympanic, softer, and decreased pain to palpation in her  abdomen compared to yesterday.  EXTREMITIES:  No lower extremity edema.  NEUROLOGIC:  The patient is alert and oriented x3, knows that she did  have hallucination last night, but she is not having now and realizes  that the hallucinations were not real.   LABORATORY DATA:  Urinalysis came back which is negative.  CBC shows  Gasper count of 5.9, hemoglobin 9.0, platelet count of 240.  BMET shows  sodium of 138, potassium 4.0, chloride 104, CO2 26, glucose  88, BUN 23,  creatinine of 2.14, calcium of 8.9, TSH was 3.277, free T4 was 0.74.   IMPRESSION:  This is 77 year old with low-grade fever and hallucinations  unknown cause.   ASSESSMENT AND PLAN:  Fever.  Unknown source but was started on Levaquin  250 mg and has been afebrile since then.  UA was normal and chest x-ray  was normal.  Question whether related to some mild obstipation will  likely continue Levaquin since she responded for another total week of  therapy.   Anemia.  She has anemia of chronic disease which is related to kidney  dysfunction and as routinely got Aranesp.  Her hemoglobin is from 10.3-  9.0 with no signs of bleed but has been getting considerable amount of  fluids and is probably dehydrated, this is dilutional very likely.  Her  daughter is to confirm what dose of Aranesp, and she might be able to  get this in the hospital prior to discharge.  Mild dehydration/chronic renal insufficiency.  She initially had  approximately creatinine of 1.83 a week ago and it worsened to 2.43 on  admission and is trending down with fluids currently.  The patient was  not drinking as well, as she has been plus all the medications which she  takes at home.   Constipation/obstipation.  Definitely could be contributing to her  abdominal pain and is relieved to some degree at this time, will need to  take stool softeners as well as possibly senna at night on a p.r.n.  basis to help out with this.   Hypothyroidism.  Free T4 is mildly low at 0.74, but TSH within normal  range.  We will need to reassess the outpatient to see if may need to  increase dosage slightly on her thyroid medication.   Hallucinations.  She is no longer having hallucinations.  We will  continue to monitor.   General debility.  The patient is a frail elderly female and will get  physical therapy to assess her balance and needs, set up home health,  balance and strengthening at home.   Hypertension.  The  patient will be resumed back on all of her blood  pressure medicines this evening.   DISPOSITION:  The patient will hopefully be discharged tomorrow if  remained stable throughout the day.  I did discuss all this with her  daughter who has spent the night with her and is at her bedside.      Catalina Pizza, M.D.  Electronically Signed     ZH/MEDQ  D:  03/09/2008  T:  03/09/2008  Job:  [id number]

## 2010-06-11 NOTE — Assessment & Plan Note (Signed)
[facility]                            CARDIOLOGY OFFICE NOTE   NAME:[name] [name] TANGNEY                        MRN:          [id number]  DATE:11/11/2007                            DOB:          02-14-1914    [name] [name] returns today for further management for the following issues.  1. Mild aortic stenosis.  2. Nonobstructive carotid disease, which was repeated today and      preliminary shows the same.  [name] [name] has good antegrade flow in both      vertebral arteries.  3. Labile hypertension.   Her daughter says her blood pressure has been under much better control.  [name] [name] denies any increased shortness of breath, syncope, presyncope, tachy  palpitations.   Her meds today cardiac wise are:  1. Aspirin 81 mg a day.  2. Hydrochlorothiazide 12.5 mg a day.  3. Toprol-XL 50 mg a day.  4. Norvasc 5 mg nightly.   PHYSICAL EXAMINATION:  GENERAL:  [name] [name] is very pleasant.  [name] [name] does not  look anything close to 77 years of age.  [name] [name] will be 77 at the end of  October.  VITAL SIGNS:  Her blood pressure is great today 122/60, her pulse is 64  and regular, weight is 114, down 4.  HEENT:  Normal.  NECK:  Carotids are full, slightly delayed.  Thyroid is not enlarged.  Trachea is midline.  LUNGS:  Clear to auscultation and percussion.  HEART:  Systolic murmur consistent with aortic stenosis.  S2 splits  along the left upper sternal border.  ABDOMEN:  Soft, good bowel sounds.  No midline or pulsatile mass.  EXTREMITIES:  No cyanosis, clubbing, or edema.  Pulses are intact.  Varicose veins are present.  There is no sign of DVT.  NEURO:  Intact.  SKIN:  Unremarkable.   [name] [name] blood pressure is excellent.  I suspect her aortic valve  disease is stable.  I have made no changes in her medical program.  We  will plan on seeing her back in a year at which time [name] [name] will need a 2-D  echo.  We will get a final report on her carotids.     [name] C. Daleen Squibb, MD, [facility]  Electronically Signed   TCW/MedQ  DD: 11/11/2007  DT: 11/12/2007  Job #: [id number]   cc:   [name] [name], M.D.

## 2010-06-11 NOTE — Procedures (Signed)
NAME:  [name] [name], [name] [name]                 ACCOUNT NO.:  [id number]   MEDICAL RECORD NO.:  [id number]          PATIENT TYPE:  OUT   LOCATION:  RAD                           FACILITY:  [facility]   PHYSICIAN:  Gerrit Friends. Dietrich Pates, MD, FACCDATE OF BIRTH:  01-24-1915   DATE OF PROCEDURE:  DATE OF DISCHARGE:                                ECHOCARDIOGRAM   CLINICAL DATA:  A 77 year old woman with aortic stenosis.  M-mode aorta  3.3, left atrium 2.8, septum 0.9, posterior wall 1.2, LV diastole 4.3,  LV systole 3.2.   1. Technically suboptimal and somewhat limited echocardiographic      study.  2. Normal normal left and right atrial size.  3. Normal right ventricular size and function; mild RVH.  4. Moderate aortic valvular sclerosis; leaflet separation cannot be      determined.  There is mild stenosis and mild insufficiency by      Doppler criteria.  5. Normal diameter of the proximal ascending aorta; moderate      calcification of the wall and annulus.  6. Pulmonic valve and proximal pulmonary artery are poorly imaged, but      are grossly normal.  7. Normal mitral valve; moderate annular calcification.  8. Normal left ventricular size; mild hypertrophy; no segmental wall      motion abnormalities appreciated; overall LV systolic function is      low normal with an estimated ejection fraction of 0.55.      Gerrit Friends. Dietrich Pates, MD, [facility]  Electronically Signed     RMR/MEDQ  D:  06/19/2006  T:  06/19/2006  Job:  [id number]

## 2010-06-11 NOTE — Discharge Summary (Signed)
NAME:  [name] [name], [name] [name]                 ACCOUNT NO.:  [id number]   MEDICAL RECORD NO.:  [id number]          PATIENT TYPE:  INP   LOCATION:  A335                          FACILITY:  [facility]   PHYSICIAN:  Catalina Pizza, M.D.        DATE OF BIRTH:  1914-11-09   DATE OF ADMISSION:  03/08/2008  DATE OF DISCHARGE:  02/12/2010LH                               DISCHARGE SUMMARY   DISCHARGE DIAGNOSES:  1. Fever.  2. Abdominal pain.  3. Obstipation/constipation.  4. Anemia.  5. Mild renal insufficiency.  6. Hypothyroidism.  7. Hallucinations/psychosis.  8. Hypertension.  9. Dizziness.  10.Back pain with compression fractures.   DISCHARGE MEDICATIONS:  Resume all home medications as previously  prescribed, which are;  1. Synthroid 50 mcg once daily.  2. Calcium with vitamin D 600 plus D once daily.  3. Zoloft 50 mg once daily.  4. Aspirin 81 mg once daily.  5. Reglan 5 mg q.6 h. p.r.n. for nausea.  6. Toprol-XL 50 mg once daily.  7. Norvasc 5 mg once daily.  8. MiraLax p.r.n.  9. Colace at bedtime.  10.Hydrochlorothiazide 12.5 mg once daily.  11.Levaquin 250 mg once daily, 5 more days.  12.Seroquel 50 mg p.o. at bedtime p.r.n. for agitation and further      psychosis.  13.Tramadol 25-50 mg q.6 h. p.r.n. for severe back pain.   BRIEF HISTORY OF PRESENT ILLNESS:  [name] [name] is a 77 year old [name] [name]  female who was in her usual state of health until approximately 2 weeks  ago when started having worsening back pain and question whether had a  new compression fracture.  She went to the emergency department and felt  that she may have had a urinary tract infection along with question of  compression fractures and was given strong pain medicine and Septra,  which she took, but continued to have low-grade fever and worsening  psychosis and hallucinations.  She was brought into my office and then  directly admitted to the hospital for further assessment.   PHYSICAL EXAMINATION AT THE TIME OF DISCHARGE:   VITAL SIGNS:  Temperature 98.0, blood pressure 153/70, pulse 71, respirations 18,  sating 95% on room air.  GENERAL:  This is an elderly female lying in bed in no acute distress.  HEENT:  Unremarkable.  LUNGS:  Clear to auscultation bilaterally.  Heart:  Regular rate and rhythm with a 3/6 systolic murmur.  ABDOMEN:  Soft, not as firm and tympanic as on admission.  EXTREMITIES:  No lower extremity edema.  NEUROLOGIC:  The patient is back to her baseline per [name] [name].   Laboratory data obtained during the hospitalization; CBC shows a [place]  count 7.4.  Initially, a hemoglobin of 10.3, platelet count of 280.  The  day before discharge; Satterwhite count was 5.9, hemoglobin 9.0, platelet  count 240.  CMET shows sodium of 133, potassium 4.1, chloride 95, CO2  28, glucose 122, BUN 27, creatinine of 2.43, total protein of 6.8,  albumin of 3.4, calcium of 9.7.  TSH was 3.277, free T4 was 0.74.  UA  was  negative.  Blood cultures x2 are negative.  Acute abdominal series  showed no acute abdominal findings, prominent stool in colon, does show  cardiac enlargement and COPD with scattered chronic lung changes.   HOSPITAL COURSE PER PROBLEM:  Fever.  She had a low-grade temperature  for approximately a week in the 99-100 range with no specific finding or  cause.  She had been on Septra and then not having any benefit was  started on Levaquin and not had any other fever since being in the  hospital or for the last 3 days, so we will continue on this for another  5 days unknown specific source, maybe related to not totally treated  urinary tract infection or possibly obstipation as well.   Altered mental status and hallucinations.  The patient initially came in  with hallucinations and seeing things that were not there, but after  good night's sleep, they resolved and was back to normal.  Again last  night, she had further hallucinations and little agitation, was given  Ativan and flew off the handle as  far as became very difficult to  control and talking out of her head and was very more actually probably  increased agitation.  She went off to sleep and woke up again this  morning back to her normal baseline.  We will have Seroquel at home on a  p.r.n. basis for her psychosis.  Question whether she is having  significant sundowning at this point is not clear.   Hypothyroidism.  Free T4 is mildly low.  TSH is in normal range.  We  will continue to monitor as an outpatient.   Hypertension.  Resumed on all her medicines.  We will continue to  monitor.   Electrolyte abnormalities, low chlorine and hyponatremia very mild may  be secondary to her hydrochlorothiazide, which goes along with her  dehydration.   Mild dehydration/renal insufficiency.  We repleted her with fluids and  her creatinine is trending down, not quite back to her baseline which  baseline creatinine is approximately 1.8.   Anemia.  She has anemia of chronic disease, which I believe is related  to her kidney dysfunction and gets routine Aranesp injections especially  when her blood count dropped below 10, so blood count was 9, and she is  not having any signs of active bleeding, and was given Aranesp, and will  need to recheck in another 2 weeks to see if she is responding to this.   DISPOSITION:  The patient will be discharged to home with close care by  her [name] [name] and will get physical therapy and home health nursing to  come assess her as well.  We will follow up in my office in 2 weeks, at  which time, we will recheck a CBC and BMET at that time.  I have  discussed all this with the [name] [name], [name] [name], and will follow up  closely.      Catalina Pizza, M.D.  Electronically Signed     ZH/MEDQ  D:  03/10/2008  T:  03/11/2008  Job:  [id number]

## 2010-06-11 NOTE — Assessment & Plan Note (Signed)
[facility]                            CARDIOLOGY OFFICE NOTE   NAME:Angus, JALONI SORBER                        MRN:          [id number]  DATE:11/12/2006                            DOB:          1914-04-23    Ms. Buske returns today for further management of her labile  hypertension, mild aortic stenosis, and carotid bruits.   I started her on Clonidine 0.1 mg p.o. b.i.d., which dropped her  pressure to 86! She is now on Toprol 50 mg daily, and Norvasc 5 mg  nightly. She continues on hydrochlorothiazide 12.5 mg daily, as well as  aspirin 81 mg daily.   Dr. Kristian Covey of the renal service in [place] obtained renal  ultrasound. This is consistent with chronic medical renal disease with  cortical thinning, equal size, and fairly small kidneys. There was no  hydronephrosis or masses other than some cyst.   Her creatinine has apparently stabilized. Her creatinine seems to do  better, as does her blood pressure, when her hemoglobin is elevated. She  is receiving Procrit shots p.r.n.   A 2D echocardiogram showed mild aortic stenosis. She has normal left  ventricular function and only mild left ventricular hypertrophy. Carotid  Dopplers showed no obstructive disease with antegrade flow in both  vertebrals.   She looks remarkably good today.  VITAL SIGNS: Her blood pressure is 136/74, pulse is 75 and regular, and  weight is 118.  HEENT:  Unchanged.  NECK:  Carotids reveal soft bruits bilaterally. There is no JVD. Thyroid  is not enlarged. Trachea is midline.  LUNGS:  Clear.  HEART:  Reveals a systolic murmur consistent with aortic stenosis. There  is no gallop.  ABDOMEN:  Soft.  EXTREMITIES:  Reveal no edema. Pulses are intact.   I had a nice chat with Laddie Aquas, her daughter, and Mrs. Mcconaghy. I think  she is doing well and I would make no changes. I do not think that she  has any clinically significant renal artery stenosis, and certainly at  this point  with good blood pressure control and a stable creatinine,  there is no indication for renal arteriography or further evaluation. I  think that we could do more harm than good as she and I discussed today.   Her daughter suggests the Clonidine p.r.n. for hypertensive flares. I  think that this is quite reasonable. I will see her back in a year  otherwise.     Thomas C. Daleen Squibb, MD, [facility]  Electronically Signed    TCW/MedQ  DD: 11/12/2006  DT: 11/13/2006  Job #: [id number]   cc:   Patrica Duel, M.D.  Jorja Loa, M.D.

## 2010-06-11 NOTE — H&P (Signed)
NAME:  [name] [name], [name] [name]                 ACCOUNT NO.:  [id number]   MEDICAL RECORD NO.:  [id number]          PATIENT TYPE:  INP   LOCATION:  A335                          FACILITY:  [facility]   PHYSICIAN:  Catalina Pizza, M.D.        DATE OF BIRTH:  1914-03-04   DATE OF ADMISSION:  03/08/2008  DATE OF DISCHARGE:  LH                              HISTORY & PHYSICAL   CHIEF COMPLAINT:  Altered mental status and hallucinations, and fever.   HISTORY OF PRESENT ILLNESS:  [name] [name] is a 77 year old [name] [name] female who  was in her usual state of health up until approximately 2 weeks ago when  she started having worsening complaints with her back.  At that time,  she was taken to the emergency department on February 5, found to have  low-grade temperature and question, very soft call on a urinary tract  infection, was given Septra.  X-ray was obtained of her lower back which  showed multiple compression fractures, none of which were noted to be  acute, but given the plain films, was unable to tell specifically if any  or new.  She has significant osteoporosis and has had compression  fractures before.  She was given new pain medicine and believe oxycodone  or Percocet and sent home.  Although she had relatively poor appetite,  was still drinking fluids, but continued to worsen and today her  grandson note she started having a worsening hallucinations and assessed  by her daughter and brought to the office and then sent to for direct  admission to the hospital for further assessment.  She has had a low-  grade temperature in the 99 range consistently for the last 5 or 6 days  with no specific source, no response from the Septra which she was  given.   PAST MEDICAL HISTORY:  Significant for osteoporosis with T4 compression  fracture; L1 compression fracture; history of hyperlipidemia; history of  iron-deficiency anemia; has noncritical aortic stenosis; history of  chronic renal insufficiency; history of  small-bowel obstruction, status  post abdominal surgery.   ALLERGIES:  PENICILLIN and NITROFURANTOIN.   MEDICATIONS:  1. Synthroid 15 mcg once daily.  2. Calcium with vitamin D 600 plus D once daily.  3. Zoloft 50 mg once daily.  4. Aspirin 81 mg once daily.  5. Reglan 5 mg q.6 h. p.r.n.  6. Toprol-XL 50 mg once daily.  7. Norvasc 5 mg once daily.  8. MiraLax p.r.n.  9. Colace at bedtime.  10.Clonidine 0.1 mg b.i.d.  11.Hydrochlorothiazide 12.5 mg once daily.   SOCIAL HISTORY:  She is widowed.  She lives with her daughter and son-in-  law here in [place].  She does not smoke or drink.  She has been able  to function by herself at home with only assistance from her daughter  and son-in-law when they are back from work.   FAMILY HISTORY:  Noncontributory.   REVIEW OF SYSTEMS:  The patient states she has mid to lower back pain  and some mild abdominal pain, has had some constipation.  Denies  any  specific neurologic complaints with numbness, tingling down in arms or  feet.  No change in her vision.   PHYSICAL EXAMINATION:  VITAL SIGNS:  Temperature on admission 99.4,  blood pressure 143/75, pulse 83, respirations 18.  GENERAL:  This is an elderly female lying in bed, in no acute distress.  HEENT:  Pupils equal round and react to light and accommodation.  Oropharynx is mildly dry, oropharynx and around lips.  No thyromegaly.  No JVD.  LUNGS:  Good air movement throughout.  HEART:  Regular rate and rhythm with of 306 systolic murmur, best  appreciated at right upper sternal border, consistent with aortic  stenosis.  ABDOMEN:  Mildly protuberant and tympanic, positive bowel sounds.  Does  have diffuse mild tenderness in all quadrants.  Question more on the  left side than the right.  EXTREMITIES:  No lower extremity edema.  NEUROLOGIC:  [name].  Moving all  extremities.  Generalized weakness.  Sensation is intact.  She knows she  is in  [place] in the hospital, but he is having visual hallucinations  with seeing things on the ceiling and seeing people that are not there.   LABORATORY DATA:  CBC shows [place] count 7.4, hemoglobin of 10.3,  platelet count of 280.  BMET shows sodium of 133, potassium 4.1,  chloride 95, CO2 28, glucose 122, BUN 27, creatinine 2.43, total bili  0.5, alk phos 79, SGOT of 26, SGPT 11, total protein 6.8, albumin 3.4,  calcium of 9.7.  Abdominal acute with chest shows no acute abdominal  findings, no signs of small-bowel obstruction, but does have prominent  stool in colon.  Cardiac enlargement which was called COPD with  scattered chronic lung changes.   IMPRESSION:  This is a 77 year old [name] [name] female with a low-grade fever  and hallucinations with worsening back pain.   ASSESSMENT/PLAN:  1. Altered mental status/hallucinations.  This could be      multifactorial, question whether this is related to obstipation,      infection versus medications given for her back pain.  No specific      neurologic deficits, but also possibility of a question history of      TIA before.  We will continue to evaluate and see if any of these      possibilities are the cause.  2. Lumbar spine pain.  Significant lumbar spine pain which was related      to compression fractures.  No specific treatment at this time      available, but question whether some of the medicines could be      causing her hallucinations.  3. A low-grade temperature.  This was felt to be a possibility for      urinary tract infection, although soft call on urinalysis and urine      culture did not show anything from February 5.  Hence has been      taking Septra routinely and has continued to have low-grade      temperature.  We will go ahead and start her on Cipro q.12 h. IV      and see if this helps resolve this.  4. Chronic renal insufficiency likely will need to adjust her Cipro      dose given her renal insufficiency.  She may be  a little bit      dehydrated, had a recent basic metabolic panel which showed a      baseline BUN and  creatinine in the 20s and creatinine of 1.8 range      just 2 weeks ago.  5. Anemia.  She has chronic anemia believed to be iron deficient.  No      specific signs of bleeding and I believe this has been stable, also      may be related to kidney dysfunction with decreased erythropoietin.  6. Hypertension.  She has a very labile blood pressures up and down      and we will continue on many of her medicines as previously      prescribed.  7. Constipation.  Question whether this could be contributing some of      her confusion.  She has been on pain medicine recently for her back      and has signs of considerable amount of stool.  Is on bowel regimen      at home, but she has not had significant bowel movement, felt      secondary to decreased p.o. intake, but although she has not had a      significant bowel movement for the last 3-5 days.   DISPOSITION:  We will get further studies and start her on medications,  only use Ativan for extreme agitation with these hallucinations, but  will hold off on any significant pain medicines for fear of causing  worsening difficulty with her mentation.  I have discussed all this with  her daughter and will follow up on the patient in the morning.      Catalina Pizza, M.D.  Electronically Signed     ZH/MEDQ  D:  03/08/2008  T:  03/09/2008  Job:  [id number]

## 2010-06-14 NOTE — H&P (Signed)
NAME:  [name] [name], [name] [name]                 ACCOUNT NO.:  [id number]   MEDICAL RECORD NO.:  [id number]          PATIENT TYPE:  INP   LOCATION:  A217                          FACILITY:  [facility]   PHYSICIAN:  Corrie Mckusick, M.D.  DATE OF BIRTH:  07/23/1914   DATE OF ADMISSION:  10/06/2004  DATE OF DISCHARGE:  [facility]                                HISTORY & PHYSICAL   ADMISSION DIAGNOSIS:  Abdominal pain, gastrointestinal bleed, anemia.   HISTORY OF PRESENT ILLNESS:  An 77 year old female with longstanding history  of osteoporosis with history of compression fractures, hyperlipidemia, and  anemia who presents with continued abdominal pain and bright red blood per  rectum.  She was recently evaluated by Dr. Karilyn Cota in March with an attempted  colonoscopy which was unable to do.  She sees my partner, Dr. Nobie Putnam, and  has had a longstanding bout with anemia.  She is visiting her daughter as  she lives in [place], [place].  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  Her case is well known to the office.  She has  been considered recently for Procrit injections.   She presents to the emergency department status post enemas for fecal  compaction with continued abdominal pain and bright red blood per rectum and  anemia.  It was decided to put her in the hospital for further evaluation  and care.   PAST MEDICAL HISTORY:  1.  Osteoporosis.  2.  T4 compression fracture.  3.  L1 compression fracture.  4.  Hyperlipidemia.  5.  Anemia, iron-deficiency.   MEDICATIONS ON ADMISSION:  1.  Calcium and vitamin D.  2.  Fosamax 70 mg weekly.  3.  Forteo injections weekly.  4.  Monopril/HCTZ 10/12.5 daily.  5.  Levoxyl 50 mcg daily.   ALLERGIES:  PENICILLIN.   SOCIAL HISTORY/FAMILY HISTORY:  Noncontributory.   PHYSICAL EXAMINATION:  VITAL SIGNS: Temperature 98.2, pulse 58, respirations  20, blood pressure 138/68.  GENERAL:  Pleasant female, talkative, in no acute distress.  HEENT:  Nasopharynx clear with moist mucous membranes.  NECK:  Supple.  CHEST:  Clear to auscultation bilaterally.  CARDIOVASCULAR:  Normal S1 and S2.  There is a 1-2/6 systolic murmur of best  heard at the right upper sternal border.  ABDOMEN:  Bowel sounds positive.  Mild tenderness in the periumbilical areas  and to the right side.  No rebound, no guarding.  No masses.  EXTREMITIES:  No edema.   LABORATORY DATA:  Please see flow sheet for details.  Unable to do a CT scan  due to renal insufficiency.   ASSESSMENT AND PLAN:  An 77 year old with longstanding history of anemia and  osteoporosis who presents with continued right-sided abdominal pain and  gastrointestinal bleeding.   PLAN:  1.  Admit for IV fluids.  2.  Will continue to follow hemoglobin and hematocrit closely and might need      transfusion.  3.  Pain control.  4.  Will consult gastroenterology for further care.  5.  Will cover with Cipro 400 mg IV q.12h.,  Flagyl 500 mg IV q.8h. in the      event that this could be diverticulitis.  6.  Will continue to follow closely and Dr. Nobie Putnam will be here in the      morning.  He knows the patient quite well and will continue to get his      opinion.      Corrie Mckusick, M.D.  Electronically Signed     JCG/MEDQ  D:  10/07/2004  T:  10/07/2004  Job:  [id number]

## 2010-06-14 NOTE — H&P (Signed)
NAME:  [name] [name], BUFANO                           ACCOUNT NO.:  [id number]   MEDICAL RECORD NO.:  [id number]                   PATIENT TYPE:  OUT   LOCATION:  MRI                                  FACILITY:  [facility]   PHYSICIAN:  Patrica Duel, M.D.                 DATE OF BIRTH:  Feb 07, 1914   DATE OF ADMISSION:  07/26/2003  DATE OF DISCHARGE:  07/26/2003                                HISTORY & PHYSICAL   CHIEF COMPLAINT:  Headache and vertigo.   HISTORY OF PRESENT ILLNESS:  This is an 77 year old female with the history  of severe osteoarthritis, as well as, osteoporosis.  She is status post  kyphoplasty for L1 compression fracture, also with a history of T4  compression fracture.  She has mild hyperlipidemia as well as longstanding  hypertension and mild anxiety disorder.  She also has documented aortic  stenosis.   The patient has been somewhat feeble for the past several weeks. She  underwent kyphoplasty with a result approximately 1 month ago.   The day prior to admission the patient developed some headache particularly  in the left occipital region.  There was a period of very sharp pain.  She  has had persistent headache as well as vertigo since.  She has also had some  vomiting without abdominal pain, fever, chills, melena, hematemesis,  hematochezia, and diarrhea.   In the office, today, the patient was found to be somewhat dehydrated with  good vital signs.  She is alert but is complaining of dizziness and funny  feeling of her left lower extremity.  Apparently upon standing she falls  consistently to the right.   There is no history of chest pain, shortness of breath, abdominal pain, or  genitourinary symptoms.   The patient was admitted for probable TIA versus CVA most likely on the  basis of hypertension.   CURRENT MEDICATIONS:  1. Darvocet p.r.n.  2. Ativan 1/2 to 1 mg p.o. t.i.d. p.r.n. anxiety.  3. Zoloft 50 daily.  4. Multiple vitamins.  5. Fosamax 70 q.  Week.  6. Levoxyl 50 mcg daily.  7. Monopril HCT 20/12.5 daily.   ALLERGIES:  PENICILLIN and MACROBID.   PAST MEDICAL HISTORY:  As noted above.   REVIEW OF SYSTEMS:  Negative except as mentioned.   FAMILY HISTORY:  Noncontributory (parents lived to be in their 2s).   SOCIAL HISTORY:  Nonsmoker and nondrinker.  She has a very supportive  family.   PHYSICAL EXAMINATION:  GENERAL:  A very pleasant, fully alert female who is  in no acute distress.  VITAL SIGNS:  Blood pressure 140/80.  She is afebrile.  Heart rate is  approximately 80 and regular.  Respirations 16-18 and unlabored.  HEENT:  Normocephalic, atraumatic.  Pupils are equal. Mucous membranes are  mildly dehydration.  SKIN:  Turgor is somewhat diminished.  NECK:  Supple, soft.  Bruits are noted,  possibly radiated aortic murmur.  LUNGS:  Clear.  CARDIOVASCULAR:  Heart sounds are normal except for a soft systolic murmur  compatible with aortic stenosis.  ABDOMEN:  Nontender, nondistended.  Bowel sounds are intact.  EXTREMITIES:  No clubbing, cyanosis, or edema.  NEUROLOGIC EXAM:  Reveals no significant focal deficits.  She is unable to  stand without assistance.  There is some generalized weakness as well as  mild sensory deficit of the left lower extremity.  Cerebellar signs are  normal and cranial nerves II-XII are intact.   ASSESSMENT:  Probable cerebrovascular accident in an elderly female with  longstanding hypertension and other medical problems as noted above.  She is  currently mildly dehydrated with persistent nausea.   PLAN:  Admit for full workup, hydration, and further evaluation and therapy  as indicated.  PT, OT, and __________ consults.  Will follow and treat  expectantly.     ___________________________________________                                         Patrica Duel, M.D.   MC/MEDQ  D:  08/21/2003  T:  08/21/2003  Job:  [id number]

## 2010-06-14 NOTE — Procedures (Signed)
NAME:  [name] [name], [name] [name]                           ACCOUNT NO.:  [id number]   MEDICAL RECORD NO.:  [id number]                   PATIENT TYPE:  INP   LOCATION:  A210                                 FACILITY:  [facility]   PHYSICIAN:  Dani Gobble, MD                    DATE OF BIRTH:  03/25/1914   DATE OF PROCEDURE:  08/21/2003  DATE OF DISCHARGE:                                  ECHOCARDIOGRAM   INDICATIONS:  [name] is an 77 year old female with known hypertension  and aortic stenosis who has experienced a CVA.   FINDINGS:  The technical quality of the study is somewhat limited secondary  to patient's body habitus and poor acoustic windows.   The aorta appears to be within normal limits at 3.4 cm.   The left atrium also is probably within normal limits in size.  The patient  appeared to be in sinus rhythm during this procedure.   The interventricular septum and posterior wall area thickened consistent  with mild concentric LVH.   The aortic valve is not well visualized.  I cannot discern the number of  leaflets on this study.  Leaflet excursion is also somewhat difficult to  discern.  There is definitely calcifications and diminished excursion but  the degree to which it is diminished is uncertain.  The Doppler  interrogation of the aortic valve reveals a peak velocity of 2 m/sec  corresponding to a peak gradient of 16 mmHg and a mean gradient of 10 mmHg.  At least moderate aortic insufficiency is noted on color-flow Doppler.   The mitral valve is also mildly thickened but with reasonable leaflet  excursion.  No mitral valve prolapse is noted.  Mild to moderate mitral  annular calcification is noted.  Trace to mild mitral regurgitation is  noted.   The pulmonic valve is not well visualized.   The tricuspid valve appears grossly structurally normal with mild tricuspid  regurgitation noted.   The left ventricle subjectively appears to be normal in size with normal  left  ventricular systolic function.  Overall ejection fraction appears to be  preserved although all walls were not well visualized.  The presence of  diastolic dysfunction is inferred from pulse-flow  Doppler across the mitral  valve.  The right atrium appears reasonable in size as does the right  ventricle and the right ventricular systolic function appears to be  preserved as well.   IMPRESSION:  1. Suboptimal study secondary to poor acoustic windows and patient's body     habitus.  2. Mild concentric left ventricular hypertrophy.  3. Aortic stenosis does appear to be present although the degree of this     stenosis is not well delineated on this study secondary to patient body     habitus and poor acoustic windows.  4. At least moderate aortic insufficiency.  5. Mildly thickened mitral valve but with  normal leaflet excursion.  6. Mild mitral annular calcification.  7. Trace to mild mitral regurgitation.  8. Mild tricuspid regurgitation.  9. Normal left ventricular size and overall preserved ejection fraction.     However, all walls were not well visualized in order to assess for     regional wall motion abnormality.  10.      The presence of diastolic dysfunction is inferred from pulse-flow     Doppler across the mitral valve.  11.      Consider transesophageal echocardiogram for enhanced delineation of     the cardiac structures.      ___________________________________________                                            Dani Gobble, MD   AB/MEDQ  D:  08/21/2003  T:  08/21/2003  Job:  [id number]   cc:   Patrica Duel, M.D.  [address]  [place]  [place] [address]  Fax: [phone]

## 2010-06-14 NOTE — Discharge Summary (Signed)
NAME:  [name] [name], [name] [name]                 ACCOUNT NO.:  [id number]   MEDICAL RECORD NO.:  [id number]          PATIENT TYPE:  INP   LOCATION:  A314                          FACILITY:  [facility]   PHYSICIAN:  Dalia Heading, M.D.  DATE OF BIRTH:  [birth year]   DATE OF ADMISSION:  10/06/2004  DATE OF DISCHARGE:  09/21/2006LH                                 DISCHARGE SUMMARY   HOSPITAL COURSE:  The patient is a 77 year old, [name] female who presented  through the emergency room with worsening nausea and vomiting.  She was  admitted by Dr. Phillips Odor for further evaluation and treatment.  She developed  renal insufficiency during her stay.  A CT scan of the abdomen and pelvis  was performed which revealed a small bowel obstruction of unknown etiology.  Surgery consultation was obtained.  The patient subsequently underwent  exploratory laparotomy.  She was found to have an internal hernia which  explained her small-bowel obstruction.  This was reduced and the internal  hernia closed without difficulty.  Her postoperative course was for the most  part unremarkable.  Dr. Kristian Covey of nephrology was consulted due to her  renal insufficiency.  This did resolve with time. Her diet was advanced once  her bowel function returned.  She did have some episodes of hypertension  which were controlled with  medications.   The patient is being discharged home on postop day #8 in fair and stable  condition.   FOLLOW UP:  The patient is to follow up with Dr. Kristian Covey upon discharge.  Dr. Lovell Sheehan on October 22, 2004.  [facility] in 1-2 weeks.   MEDICATIONS:  1.  Norvasc 5 mg p.o. daily.  2.  Toprol XL 100 mg p.o. daily.  3.  Darvocet-N 100 1-2 tablets p.o. q.4h. p.r.n. pain.  4.  Levoxyl as previously prescribed.   SPECIAL INSTRUCTIONS:  She is not to take her Monopril/hydrochlorothiazide  as previously prescribed.   DISCHARGE DIAGNOSES:  1.  Small bowel obstruction.  2.  Renal insufficiency, resolving.  3.  Iron deficiency anemia.  4.  Osteoporosis.   PRINCIPAL PROCEDURE:  Exploratory laparotomy on October 09, 2004.      Dalia Heading, M.D.  Electronically Signed     MAJ/MEDQ  D:  10/17/2004  T:  10/17/2004  Job:  [id number]   cc:   Jorja Loa, M.D.  Fax: [phone]

## 2010-06-14 NOTE — Consult Note (Signed)
NAME:  [name] [name], [name] [name]                 ACCOUNT NO.:  [id number]   MEDICAL RECORD NO.:  [id number]          PATIENT TYPE:  INP   LOCATION:  IC07                          FACILITY:  [facility]   PHYSICIAN:  Jorja Loa, M.D.DATE OF BIRTH:  Jul 01, 1914   DATE OF CONSULTATION:  10/09/2004  DATE OF DISCHARGE:                                   CONSULTATION   REASON FOR CONSULTATION:  Worsening of renal failure.   HISTORY OF PRESENT ILLNESS:  Mrs. Cegielski is a 77 year old female with past  medical history of severe osteoarthritis with multiple compression  fractures, history of hypertension, hypothyroidism and previous history of  GI bleeding presently, admitted because of recurrent abdominal pain and  history of anemia.  However, the patient was admitted and continues to have  abdominal pain.  Because of that, the patient was taken to the OR and was  found to have internal hernia.  She is status post repair with NG tube  suction.  Presently, consult is called because of increasing BUN and  creatinine.  At this moment, there is no previous history of renal failure.  No history of kidney stone.  At this moment, it is very difficult to get  much information from the patient.   PAST MEDICAL HISTORY:  1.  Severe osteoporosis.  2.  Multiple compression fractures.  3.  History of GERD.  4.  History of __________ fracture from before.  5.  History of aortic stenosis.  6.  History of hypertension.  7.  History of hypothyroid symptoms.   PAST SURGICAL HISTORY:  1.  Status post cataract surgery.  2.  History of also surgery for kyphosis.  3.  In the last year or so, she presently came from surgery for intestine      obstruction with small internal hernia repair.   MEDICATIONS:  1.  Cipro 400 mg IV every 24 hours.  2.  Dextrose 0.5 normal saline at 10-50 cc per hour.  3.  Synthroid 50 mcg p.o. daily.  4.  Prinivil 30 mg p.o. daily.  5.  Toprol 12.5 mg p.o. daily.  6.  Flagyl 500 mg IV q.8h.  7.   Protonix 40 mg IV q.24h.  8.  The p.o. medication on this woman needs to be held.  9.  Nitrofurantoin.   ALLERGIES:  PENICILLIN.   SOCIAL HISTORY:  No history of smoking.  She is a widow with two children.   REVIEW OF SYSTEMS:  At this moment, this patient came from __________.  She  was complaining of feeling cold, and says she is not feeling good.  No  specific complaints.  She is weak and also has some shortness of breath  which she has had for some time.   PHYSICAL EXAMINATION:  VITAL SIGNS:  Blood pressure 156/90, afebrile, heart  rate 86, O2 saturation on __________ 96%.  HEENT:  Emaciated.  Conjunctival pallor nonicteric.  Oral mucosa seems to be  dry.  CHEST:  Decreased breath sounds.  Otherwise, seems to be clear.  No rales,  no rhonchi, no egophony.  HEART:  Reveals  a regular rhythm.  She has a 2/6 systolic ejection murmur.  No S3.  ABDOMEN:  Not examined since the patient came from surgery.  EXTREMITIES:  No edema.   LABORATORY DATA:  Her input and output was in the last 24 hours.  She has  input of 2700, output of 300 with 23 cc positive fluid.  Her Hardrick blood  cell count 12.8, hemoglobin 13.2, hematocrit 37.9, platelets 251,000.  Sodium 131, potassium 3.7 (yesterday was 3.1, she received some potassium),  BUN 26, creatinine 3.5 (on September 10 was 1.9 and has increased to 2.1  since then.  It seems to be progressively worsening), glucose 141.  Albumin  on September 11 was 3.1, calcium 7.7 (calcium today is 8.1).  Iron studies:  Iron saturation 7, ferritin is 395.  UA on September 10:  Specific gravity  was 1.02, protein negative.  Everything seems to be negative at this moment  including no red blood cells, no Malkin blood cells.  She had CT scan of the  abdomen and there was no mention of hydro__________ and normal study.   ASSESSMENT:  1.  Renal insufficiency, as this woman seems to be poorly acute.  However,      since this patient has a creatinine of 1.9 on  the first day she was      here, hence underlying chronic renal insufficiency cannot be ruled out.      As the present increasing BUN and creatinine simply could be from      intravascular volume deficiency associated with poor p.o. intake as the      specific gravity was high.  However, ATN and acute interstitial      nephritis cannot be ruled out.  Presently, the patient does not have any      sinus fluid overload.  2.  History of anemia.  She has previous history of GI bleeding.  Her INR      saturation, at this moment, seems to be okay.  Hence, the patient might      also have anemia of chronic disease.  Her H&H at this moment seems to be      stable.  3.  Hypokalemia.  That has improved, probably related to antibiotics.  4.  Hypoalbuminemia.  Probably from malnutrition.  5.  History of hypertension.  Blood pressure seems to be controlled,      reasonable.  6.  History of hypothyroidism.  7.  History of osteoporosis.  8.  History of aortic stenosis.   RECOMMENDATIONS:  IV with hydration, at this moment, and because of  continuous increase in her creatinine, will hold the Prinivil.  If the blood  pressure becomes an issue, we may need to use other medications, either in  IV form or in a patch.  Will continue other medications as before.  I agree  with hydration to convert her probably through oliguria.  We may try to use  diuretics if the patient __________ hydrated.  I agree with the present IV.      Jorja Loa, M.D.  Electronically Signed     BB/MEDQ  D:  10/09/2004  T:  10/10/2004  Job:  [id number]

## 2010-06-14 NOTE — Consult Note (Signed)
NAME:  [name], [name]                 ACCOUNT NO.:  [id number]   MEDICAL RECORD NO.:  [id number]          PATIENT TYPE:  AMB   LOCATION:                                FACILITY:  [facility]   PHYSICIAN:  Lionel December, M.D.    DATE OF BIRTH:  1914/08/28   DATE OF CONSULTATION:  DATE OF DISCHARGE:                                   CONSULTATION   REASON FOR CONSULTATION:  Chronic constipation, abdominal pain.  The patient  was recently treated for diverticulitis.   HISTORY OF PRESENT ILLNESS:  [name] [name] is an 77 year old Caucasian female  who is referred through the courtesy of Dr. Regino Schultze for GI evaluation.  She  has chronic constipation and has been dependent on laxatives.  She developed  abdominal pain with inability to have a bowel movement on 02/26/04.  The  usual therapies including Fleet enema did not work.  She was seen at an  urgent care and then at the hospital.  She was diagnosed with diverticulitis  based on the CT result.  She was treated with antibiotics and gradually  improved.  She was discharged on 03/04/04.  She has finished her antibiotic  therapy.  She developed lower extremity edema.  She was given Demadex and  potassium by Dr. Nobie Putnam, which her daughter, Karin Golden, gave to her but she  stopped it since edema has resolved.  She is finally getting herself back on  her feet.  She still feels weak.  She does complain of intermittent  discomfort in the right lower quadrant which is nothing like what she  experienced four weeks ago.  She generally has a bowel movement every day.  Some days, she has much better results than others.  She denies melena or  rectal bleeding.  At times, she feels full in her lower abdomen/pelvic area.  She also complains of heartburn which is not controlled with AcipHex, but  she takes it after breakfast.  She has not lost any weight recently.  Her  appetite is not normal but a lot better since her hospitalization.  She  denies dysphagia, hoarseness, or  sore throat.  She has never undergone a BE  or a colonoscopy.  This was recommended to her while she was hospitalized in  [place].  They also recommended colonoscopy at a later  date.  The patient is not sure that she wants to have this test.   CURRENT MEDICATIONS:  She is on lisinopril 20 mg q.a.m., HCTZ 12.5 mg daily,  Zoloft 50 mg daily, Synthroid 50 mcg daily, ASA 81 mg daily, Peri-Colace 2  tablets in the a.m. and 1 in the p.m., Toprol-XL 12.5 mg q.h.s., Travatan  eye drops to each eye at bedtime, lactulose 2 tablespoonfuls q.h.s., Forteo  ? dose subcutaneously daily, AcipHex 20 mg daily.  She is also supposed to  be on calcium.   PAST MEDICAL HISTORY:  She is hypertension, hypothyroidism, severe  osteoporosis which was diagnosed in 2002, and she had a fall and had  compression fracture to one of her vertebrae, left rib, and clavicular  fracture.  She had left submandibular salivary gland removed in 1973.  She  has had bilateral cataract extraction.  She had kyphoplasty in 6/05.   She has chronic GERD.  She had EGD/ED in 6/02.  She had mild changes of  reflux esophagitis, __________ junction and a small sliding hiatal hernia.  ED with 54 French Maloney dilator resulted in small tear in cervical  esophagus indicative of stricture.  At that time, she had been on Fosamax.  She also has mild aortic stenosis.   ALLERGIES:  Penicillin and Macrobid.   FAMILY HISTORY:  Mother lived to be 45.  Father died in 32.  Death  certificate said possible colon carcinoma, but this was never confirmed.  Sister died of some type of malignancy at age 14.   SOCIAL HISTORY:  She is widowed.  She has two children.  [place] but since her illness she has been staying with  her daughter, Karin Golden, here in [place].  She is a retired Geologist, engineering.  She does not smoke cigarettes or drink alcohol.   OBJECTIVE:  Pleasant, well-developed, thin Caucasian  female who is in no  acute distress.  She weighs 109 pounds.  She is 5 feet tall.  Pulse  76/minute, blood pressure 118/60.  Temperature is 97.8.  Conjunctivae is  pink.  Sclerae is nonicteric.  Oropharyngeal mucosa is normal.  She has  upper and lower dentures in place.  Neck without masses or thyromegaly.  Cardiac exam:  Regular rhythm with normal S1 and S2.  She has grade 2/6  systolic ejection murmur which is best heard at aortic area.  Lungs are  clear to auscultation.  She does have slight kyphosis.  Her abdomen is  symmetrical.  Bowel sounds are normal with no bruits noted.  On palpation,  it is soft with mild tenderness.  No organomegaly or masses noted.  Rectal  examination reveals normal sphincter tone.  She has soft, small skin tags.  Digital exam is normal.  Did not palpate a polyp or other abnormalities.  Apparently, she had a lump would come up with this as a result.  There is no  stool in the vault, but mucous is guaiac-negative.  No peripheral edema or  clubbing noted.   ASSESSMENT:  [name] [name] is an 77 year old Caucasian female who was recently  treated for diverticulitis and seemed to have recovered completely.  She has  chronic constipation and is dependent on laxatives.  She is on high fiber  diet, Peri-Colace, and lactulose and is having a  bowel movement daily.  I  doubt that we would be able to filter out one or the other medications.  I  feel that if it keeps working, we should maintain.  However, she may want to  cut back on the dose of lactulose which would help and still might work.  It  would be a good idea that she undergo colonoscopy primarily for screening  purposes, but at the present time she does not feel that she can undergo the  prep, and I feel that waiting would be very reasonable.  It would be  reasonable to delay the procedure until she is ready.  Chronic GERD.  Symptoms are well controlled with  AcipHex, but she is not  taking the correct time.    PLAN:  The patient is advised to add bulk forming agent.  She can take  Citrucel or will try Fiber Choice 1 tablet b.i.d.  She  will continue Peri-  Colace.  She will drop evening dose of Peri-Colace and continue lactulose at  1-2 tablespoons for q.h.s.  Will stop her AcipHex and try Zegrid 40 mg  daily.  She can take it before breakfast, evening meal, or at bedtime.  Hemoccult x3.  Karin Golden will call us with progress report in a couple of  weeks.  If her GERD symptoms are not well controlled, we may also consider  EGD at the time of colonoscopy.   We will like to thank Dr. Regino Schultze for the opportunity of participating in  the care of this nice lady.      NR/MEDQ  D:  03/20/2004  T:  03/21/2004  Job:  [id number]   cc:   Kirk Ruths, M.D.  [address]  [place]  [place] [address]  Fax: [phone]

## 2010-06-14 NOTE — Op Note (Signed)
NAME:  [name] [name], [name] [name]                 ACCOUNT NO.:  [id number]   MEDICAL RECORD NO.:  [id number]          PATIENT TYPE:  INP   LOCATION:  IC07                          FACILITY:  [facility]   PHYSICIAN:  Dalia Heading, M.D.  DATE OF BIRTH:  Aug 03, 1914   DATE OF PROCEDURE:  10/09/2004  DATE OF DISCHARGE:                                 OPERATIVE REPORT   PREOPERATIVE DIAGNOSIS:  Small-bowel obstruction.   POSTOPERATIVE DIAGNOSIS:  Small-bowel obstruction.   PROCEDURE:  Exploratory laparotomy, reduction of internal hernia.   SURGEON:  Dr. Franky Macho   ANESTHESIA:  General endotracheal.   INDICATIONS:  The patient is an 77 year old [place] female who presents with a  small-bowel obstruction. It has not decompressed despite nasogastric tube  decompression. She now comes the operating room for exploratory laparotomy.  Risks and benefits of procedure including bleeding, infection, the  possibility of bowel resection, worsening renal failure, and cardiopulmonary  difficulties were fully explained to the patient, who gave informed consent.   PROCEDURE NOTE:  The patient was placed in the supine position. After  induction of general endotracheal anesthesia, the abdomen was prepped and  draped using the usual sterile technique with Betadine. Surgical site  confirmation was performed.   A midline incision was made from above the umbilicus to below the umbilicus.   The peritoneal cavity was entered into without difficulty. The proximal two  thirds of the small bowel was noted to be dilated. The bowel was run from  the ligament Treitz to the terminal ileum. It was noted that there was an  internal hernia at the base of the transverse colon mesentery, to the right  of midline. The contents were reduced. There were proximally two feet of  small intestine within this hernia. There was some edema of the proximal  small bowel, but there was good peristalsis present. The hernia defect was  closed  using a running 2-0 chromic gut suture. Several Lambert sutures were  placed around a ringed area of inflammation of the small bowel. The lumen  was noted be widely patent. The bowel was again inspected from the ligament  Treitz to the terminal ileum. The nasogastric tube was noted be its  appropriate position in the stomach. Any significantly dilated small bowel  loops were milked back to the stomach, and this was aspirated through the  nasogastric tube. The liver was within normal limits. No abnormal lesions  were noted. Diverticula were seen throughout the colon. The bowel was  returned into the abdominal cavity in an orderly fashion. The fascia was  reapproximated using a looped 0 Novofil running suture. Subcutaneous layer  was irrigated with normal saline, and the skin was closed using staples.  Betadine ointment and dry sterile dressing were applied.   All tape and needle counts were correct at the end of the procedure. The  patient was extubated in the operating room and went back to recovery room  awake in stable condition.   COMPLICATIONS:  None.   SPECIMEN:  None.   BLOOD LOSS:  Minimal.      Loraine Leriche  Val Riles, M.D.  Electronically Signed     MAJ/MEDQ  D:  10/09/2004  T:  10/10/2004  Job:  [id number]   cc:   Lionel December, M.D.  [address]  [place]  [place] [address]

## 2010-06-14 NOTE — Discharge Summary (Signed)
NAME:  [name] [name], [name] [name]                           ACCOUNT NO.:  [id number]   MEDICAL RECORD NO.:  [id number]                   PATIENT TYPE:  INP   LOCATION:  A210                                 FACILITY:  [facility]   PHYSICIAN:  Patrica Duel, M.D.                 DATE OF BIRTH:  [birth year]   DATE OF ADMISSION:  08/21/2003  DATE OF DISCHARGE:  08/22/2003                                 DISCHARGE SUMMARY   DISCHARGE DIAGNOSES:  1. Acute vertigo and ataxia, probably related to sinusitis, symptoms     resolved, question transient ischemic attack.  2. Severe osteoarthritis.  3. Osteoporosis, status post compression fracture L1 with subsequent     kyphoplasty as well as a history of T4 compression fracture.  4. Mild hyperlipidemia.  5. Long-standing hypertension, well controlled.  6. Anxiety disorder.  7. Aortic stenosis.   HISTORY:  For details regarding admission, please refer to the admitting  note.   Briefly, this 77 year old female with the above history developed headache  particularly in the left occipital region with a period of very sharp pain  the day prior to admission.  She has persistent headache as well as vertigo  and was brought to the office for evaluation.  She was found to be somewhat  dehydrated with good vital signs, she was alert but complaining of dizziness  and a funny feeling over the left lower extremity.  Upon standing, she  consistently fell to the right.  There was no history of chest pain,  shortness of breath, abdominal pain, or genitourinary symptoms.  She was  admitted for probable TIA versus CVA.  Of note, home blood pressures had  been somewhat elevated, per her daughter who is a Engineer, civil (consulting).   HOSPITAL COURSE:  The patient was admitted to 2-A telemetry for close  observation.  Her symptoms stabilized nicely.  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  There were no acute changes.  MRI  also showed no acute infarct, but age-related,  moderate small vessel disease  type changes and old small cerebellar infarcts were noted.  There was also  right maxillary sinus partial opacification.  Carotid arteries showed mild  bilateral plaque formation bilaterally.  No luminal narrowing or flow  reduction.  Echocardiogram demonstrated aortic stenosis of significance.  She did have mild aortic insufficiency and mild thickening of the mitral  valve with normal leaflet excursion, normal left ventricular size and  overall preserved ejection fraction.  There was evidence of diastolic  dysfunction.   The patient continued to improve and was stable for discharge on the  following hospital day.   DISPOSITION:   MEDICATIONS:  1. Lisinopril 20 mg daily.  2. HCTZ 12.5 daily.  3. Synthroid 50 mcg p.o. daily.  4. Aspirin 81 daily.  5. Darvocet N 100 p.r.n. pain.  6. Naproxen p.r.n. back pain.  7. Aciphex  20 mg p.o. b.i.d.  8. She will be treated symptomatically with meclizine and Levaquin.   She followed expectantly as an outpatient.     ___________________________________________                                         Patrica Duel, M.D.   MC/MEDQ  D:  09/11/2003  T:  09/11/2003  Job:  [id number]

## 2010-06-14 NOTE — Op Note (Signed)
NAME:  [name] [name], [name] [name]                 ACCOUNT NO.:  [id number]   MEDICAL RECORD NO.:  [id number]          PATIENT TYPE:  AMB   LOCATION:  DAY                           FACILITY:  [facility]   PHYSICIAN:  Lionel December, M.D.    DATE OF BIRTH:  1914-10-04   DATE OF PROCEDURE:  04/05/2004  DATE OF DISCHARGE:                                 OPERATIVE REPORT   PROCEDURE:  Attempted colonoscopy which was incomplete secondary to poor  prep.   INDICATION:  [name] is an 77 year old Caucasian female with chronic  constipation who was hospitalized recently at an outside facility for fecal  impaction and diverticulitis. She finally has improved with therapy;  however, she still remains with constipation and some discomfort. She is  undergoing diagnostic colonoscopy. The patient was prepped at the hospital  today. She could not drink all of GoLYTELY. She has been given magnesium  citrate as well as enemas.   Procedure risks were reviewed the patient, and informed consent was  obtained.   PREMEDICATION:  Versed 2.5 mg IV in divided dose.   FINDINGS:  Procedure performed in endoscopy suite. The patient's vital signs  and O2 saturation were monitored during procedure and remained stable. The  patient was placed left lateral position and rectal examination performed.  No abnormality noted on external or digital exam. Olympus video scope was  placed rectum and advanced under vision into sigmoid colon where scattered  diverticula were noted with a lot of formed stool. Slowly and carefully, the  scope was passed to about 40 cm. There was a large stool mass. I was not  able to wash it or dislodge with a jet of water. Therefore, exam was  incomplete. Endoscope was withdrawn. Rectal mucosa was normal. There was  suggestion of  melanosis coli involving the rectal and sigmoid colon mucosa.  Scope was retroflexed to examine anorectal junction which was unremarkable.  Endoscope was withdrawn. The patient tolerated  the procedure well.   FINAL DIAGNOSIS:  1.  Incomplete examination secondary to poor prep.  2.  Sigmoid colon diverticulosis.   RECOMMENDATIONS:  She will return for colonoscopy on April 08, 2004. She  will remain on low residue diet and continue her Peri-Colace and lactulose,  and she will be prepped with magnesium citrate on April 07, 2004.      NR/MEDQ  D:  04/05/2004  T:  04/05/2004  Job:  [id number]   cc:   Patrica Duel, M.D.  [address]  [place]  [place] [address]  Fax: [phone]

## 2010-06-14 NOTE — Procedures (Signed)
[facility]  Patient:    [name] [name], [name] [name] Visit Number: [id number] MRN: [id number]          Service Type: OUT Location: RAD Attending Physician:  Cain Sieve Dictated by:   Delton See, P.A. Admit Date:  05/21/2001   CC:         Patrica Duel, M.D.   Stress Test  DATE OF BIRTH:  1914/11/06  BRIEF HISTORY:  This is a pleasant 77 year old female who was recently evaluated in the office for evaluation of chest pain, shortness of breath, and fatigue.  She has no documented coronary artery disease.  She has chronic back pain, hypertension, elevated lipids, chronic anemia, anxiety and depression. She had a recent echocardiogram that showed an EF of 55-60% with mild AS and mild AI.  Prior to the study the patient had no complaints.  Her baseline EKG showed sinus rhythm, rate 68 beats per minute without ischemic changes.  Blood pressure 134/70.  Persantine was administered minutes one through four.  Cardiolite was given at six minutes.  The patient did experience some chest tightness, some fatigue, and a funny feeling in her head.  There were no ischemic changes on the EKG.  The patient was given 50 mg of IV Aminophyllin at the conclusion of the study for the above noted symptoms.  Her symptoms are resolving at this time.  The final images are pending at time of this dictation. Dictated by:   Delton See, P.A. Attending Physician:  Cain Sieve DD:  05/21/01 TD:  05/21/01 Job: [id number]

## 2010-06-14 NOTE — Op Note (Signed)
NAME:  [name], [name]                 ACCOUNT NO.:  [id number]   MEDICAL RECORD NO.:  [id number]          PATIENT TYPE:  AMB   LOCATION:  DAY                           FACILITY:  [facility]   PHYSICIAN:  Lionel December, M.D.    DATE OF BIRTH:  May 01, 1914   DATE OF PROCEDURE:  04/08/2004  DATE OF DISCHARGE:                                 OPERATIVE REPORT   PROCEDURE:  Attempted colonoscopy performed to splenic flexure.   INDICATION:  [name] is an 77 year old Caucasian female with history of  constipation who was recently treated for diverticulitis while she was out  of town. Colonoscopy was recommended. The patient is now well enough that  she decided to undergo this test. She was here 3 days ago; however, she  never could be prepped. She had lot of stool in her distal sigmoid colon.  Therefore examination was not complete. She is now returning after other  session with prep.   Procedure risks were reviewed the patient, and informed consent was  obtained.   PREMEDICATION:  Versed 2.5 mg IV.   FINDINGS:  Procedure performed in endoscopy suite. The patient's vital signs  and O2 saturation were monitored during procedure and remained stable. The  patient was placed in left lateral position and rectal examination  performed. No abnormality noted external or digital exam. Olympus videoscope  was placed in rectum and advanced under vision into sigmoid colon which was  very tortuous with fine mucosal pigmentation consistent with melanosis coli.  She had multiple diverticula. Slowly and carefully, scope was passed through  this segment with multiple diverticula into descending colon and splenic  flexure. As the scope was withdrawn, never could be brought into the  straight position. Felt that she had a loop. As the loop could not be  completely reduced, I did not another attempt to pass the scope further into  transverse colon. As the scope was withdrawn, colonic mucosa was examined  for the  second time, and there were no polyps, tumor masses or stricture in  the segments that were examined. Rectal mucosa was normal. Scope was  retroflexed to examine anorectal junction which was unremarkable. Endoscope  was straightened and withdrawn. The patient tolerated the procedure well.   FINAL DIAGNOSIS:  1.  Extensive diverticula and tortuous sigmoid colon with multiple      diverticula but no evidence of colonic stricture.  2.  Melanosis coli.  3.  Examination limited to splenic flexure.   RECOMMENDATIONS:  She will resume her usual meds including resume her high-  fiber diet and FiberChoice, Peri-Colace and lactulose as before.   Please note the patient had transient run of asymptomatic SVT. She is on low-  dose Toprol which she will resume at the usual dose.      NR/MEDQ  D:  04/08/2004  T:  04/08/2004  Job:  [id number]   cc:   Patrica Duel, M.D.  [address]  [place]  [place] [address]  Fax: [phone]

## 2010-06-14 NOTE — Consult Note (Signed)
NAME:  [name] [name], [name] [name]                 ACCOUNT NO.:  [id number]   MEDICAL RECORD NO.:  [id number]          PATIENT TYPE:  INP   LOCATION:  A217                          FACILITY:  [facility]   PHYSICIAN:  Lionel December, M.D.    DATE OF BIRTH:  23-Jan-1915   DATE OF CONSULTATION:  10/07/2004  DATE OF DISCHARGE:                                   CONSULTATION   REASON FOR CONSULTATION:  GI bleed with abdominal pain.   HISTORY OF PRESENT ILLNESS:  [name] [name] is an 77 year old Caucasian female  who reports 2 days ago she began experiencing excruciating upper abdominal  pain. She describes the pain as somewhat like labor pain.  This was  followed by nausea and vomiting of bilious material.  She notes the majority  of the pain was focused on the right lower quadrant and radiated through to  her back.  She rates the pain as 10/10 on the pain scale.  Generally has  lasted all day the last 2 days. She has had some relief since being given IV  morphine while hospitalized.  She also noted bright red rectal bleeding in  scant amount with wiping on the toilet paper and felt this was possibly due  to a hemorrhoid.  She denies any history of diarrhea. She reports a low-  grade temperature around 99 as well as some chills.  Her last bowel movement  was about 2 days ago. She has a history of recurrent diverticulitis with the  last being either February or March of this year. She generally takes  lactulose 2 tablespoons at bedtime and Fleet suppositories on a daily basis  for chronic constipation.  This results in a bowel movement practically  every day. She also has refractory heartburn and indigestion symptoms as  well.   Upon admission, she had leukocytosis with Mcelhiney blood cell count of 11,000.  Today it is 6700. She is also anemic with a hemoglobin of 10.9 on admission,  now down to 8.5 with an MCV of 88.7.  She has been started on Flagyl 500 mg  IV t.i.d., Cipro 400 mg IV daily, and morphine for pain and  Zofran for  nausea.  Symptomatically, she is feeling somewhat better.   PAST MEDICAL HISTORY:  1.  Hypertension.  2.  Hypothyroidism.  3.  Chronic constipation, laxative dependent.  4.  Chronic GERD.  5.  Oral surgery on salivary gland.  6.  Mild aortic stenosis.  7.  Chronic anemia.  8.  Osteoarthritis.  9.  Osteoporosis.  10. Attempted colonoscopy by Dr. Karilyn Cota March 13 revealed extensive      diverticular disease, tortuous sigmoid colon, multiple sigmoid      diverticulum, melanosis coli. Exam was limited to the splenic flexure.   MEDICATIONS PRIOR TO ADMISSION:  1.  Synthroid 50 mcg daily.  2.  Zoloft 50 mg daily.  3.  Lisinopril 20 mg daily.  4.  Aciphex 20 mg in the morning.  5.  Hydrochlorothiazide 12.5 mg daily.  6.  Aspirin 81 mg daily.  7.  Reglan 5 mg t.i.d.  8.  Lactulose 1 to 2 tablespoons p.r.n. constipation.  9.  Toprol XL 12.5 mg daily.  10. Forteo injections.   ALLERGIES:  MACROBID and PENICILLIN.   FAMILY HISTORY:  Noncontributory.   SOCIAL HISTORY:  Denies tobacco, alcohol, or drug use.  Ms. Biagini lives in  [place]  However, her daughter resides here in  [place].  She has 2 children.   REVIEW OF SYSTEMS:  CONSTITUTIONAL:  Her weight is stable.  She is  complaining of some anorexia and fatigue.  CHEST: Clear to auscultation.  No  shortness of breath, dyspnea, cough, or hemoptysis.  GI:  See HPI.  She is  chronically on Aciphex for heartburn and indigestion.  Her symptoms have  become more refractory recently.  Denies any dysphagia or odynophagia.  She  denies any regurgitation.  GU:  Denies any dysuria, hematuria, but does  report decreased urinary frequency with this acute illness.   PHYSICAL EXAMINATION:  VITAL SIGNS:  Weight 108 pounds, height 50 inches,  temperature 98 degrees, pulse 64, respirations 17, blood pressure 117/63.  GENERAL:  [name] is an elderly-appearing Caucasian female who is  oriented, pleasant,  cooperative, in no acute distress.  HEENT:  Sclerae clear, nonicteric.  Conjunctivae pink.  Oropharynx pink and  moist without lesions.  NECK:  Supple without adenopathy or thyromegaly.  CHEST: Regular rate and rhythm.  Normal S1 and S2 without murmurs, clicks,  rubs, or gallops.  LUNGS:  Clear to auscultation bilaterally.  ABDOMEN:  Positive bowel sounds x4.  No bruits auscultated.  She does have  significant right lower quadrant tenderness on deep palpation as well as  tenderness around the umbilicus.  There is no rebound tenderness or  guarding.  Abdomen is soft and not firm.  She does have McBurney's point  tenderness.  EXTREMITIES:  Without edema bilaterally.  SKIN:  Pale, warm, dry without rash or jaundice.   LABORATORY STUDIES:  WBC 6.7, hemoglobin 8.5, hematocrit 24.7, platelets  293, MCV 88.7.  Calcium 7.7, sodium 139, potassium 4, chloride 103, CO2 27,  BUN 24, creatinine 2.1, glucose 117, total bilirubin 0.2, alkaline  phosphatase 74, AST 28, ALT 12, total protein 5.5, albumin 3.1, amylase 128,  lipase 25.  Urinalysis negative.   IMPRESSION:  [name] [name] is an 77 year old Caucasian female with a history of  recurrent diverticulitis, chronic constipation with laxative use, chronic  gastroesophageal reflux disease, and chronic anemia, who presents with a 2-  day history of severe right upper and right lower quadrant abdominal pain,  nausea, vomiting, fever, leukocytosis, and anemia.  We need to further  evaluate her pain with a CT scan, although unfortunately noncontrast is  going to have to be obtained given her elevated renal function tests.  I  suspect diverticulitis versus appendicitis or less likely ischemic colitis  versus gastroenteritis.   PLAN:  1.  Will request more pain medications for now.  She is having good response      to morphine.  2.  Agree with transfusion to keep hemoglobin and hematocrit stable. 3.  STAT CT of abdomen and pelvis with oral contrast  only.  4.  Await anemia profile before further invasive testing.  5.  Further recommendations to follows.   We would like to thank Dr. Phillips Odor for allowing [place] to participate in the  care of Ms. Bohlman.      Nicholas Lose, N.P.      Lionel December, M.D.  Electronically Signed    KC/MEDQ  D:  10/07/2004  T:  10/07/2004  Job:  [id number]   cc:   Corrie Mckusick, M.D.  Fax: [phone]

## 2010-06-22 ENCOUNTER — Emergency Department (HOSPITAL_COMMUNITY): Payer: Medicare Other

## 2010-06-22 ENCOUNTER — Emergency Department (HOSPITAL_COMMUNITY)
Admission: EM | Admit: 2010-06-22 | Discharge: 2010-06-22 | Disposition: A | Discharge status: Home / Self Care | Payer: Medicare Other | Attending: Emergency Medicine | Admitting: Emergency Medicine

## 2010-06-22 DIAGNOSIS — Z79899 Other long term (current) drug therapy: Secondary | ICD-10-CM | POA: Insufficient documentation

## 2010-06-22 DIAGNOSIS — Y92009 Unspecified place in unspecified non-institutional (private) residence as the place of occurrence of the external cause: Secondary | ICD-10-CM | POA: Insufficient documentation

## 2010-06-22 DIAGNOSIS — Y998 Other external cause status: Secondary | ICD-10-CM | POA: Insufficient documentation

## 2010-06-22 DIAGNOSIS — I10 Essential (primary) hypertension: Secondary | ICD-10-CM | POA: Insufficient documentation

## 2010-06-22 DIAGNOSIS — W108XXA Fall (on) (from) other stairs and steps, initial encounter: Secondary | ICD-10-CM | POA: Insufficient documentation

## 2010-06-22 DIAGNOSIS — S20229A Contusion of unspecified back wall of thorax, initial encounter: Secondary | ICD-10-CM | POA: Insufficient documentation

## 2010-06-22 DIAGNOSIS — IMO0002 Reserved for concepts with insufficient information to code with codable children: Secondary | ICD-10-CM | POA: Insufficient documentation

## 2010-06-22 DIAGNOSIS — M810 Age-related osteoporosis without current pathological fracture: Secondary | ICD-10-CM | POA: Insufficient documentation

## 2010-06-22 DIAGNOSIS — E039 Hypothyroidism, unspecified: Secondary | ICD-10-CM | POA: Insufficient documentation

## 2010-06-22 DIAGNOSIS — Z8673 Personal history of transient ischemic attack (TIA), and cerebral infarction without residual deficits: Secondary | ICD-10-CM | POA: Insufficient documentation

## 2010-06-22 DIAGNOSIS — S7000XA Contusion of unspecified hip, initial encounter: Secondary | ICD-10-CM | POA: Insufficient documentation

## 2010-06-22 LAB — CBC
MCH: 30.5 pg (ref 26.0–34.0)
MCHC: 33.2 g/dL (ref 30.0–36.0)
MCV: 91.8 fL (ref 78.0–100.0)
Platelets: 168 10*3/uL (ref 150–400)
RBC: 3.41 MIL/uL — ABNORMAL LOW (ref 3.87–5.11)
RDW: 13.4 % (ref 11.5–15.5)

## 2010-06-22 LAB — URINALYSIS, ROUTINE W REFLEX MICROSCOPIC
Hgb urine dipstick: NEGATIVE
Nitrite: NEGATIVE
Specific Gravity, Urine: 1.015 (ref 1.005–1.030)
Urobilinogen, UA: 0.2 mg/dL (ref 0.0–1.0)
pH: 7.5 (ref 5.0–8.0)

## 2010-06-22 LAB — COMPREHENSIVE METABOLIC PANEL
AST: 23 U/L (ref 0–37)
Albumin: 3.5 g/dL (ref 3.5–5.2)
BUN: 26 mg/dL — ABNORMAL HIGH (ref 6–23)
Calcium: 10.6 mg/dL — ABNORMAL HIGH (ref 8.4–10.5)
Chloride: 97 mEq/L (ref 96–112)
Creatinine, Ser: 1.63 mg/dL — ABNORMAL HIGH (ref 0.4–1.2)
GFR calc Af Amer: 36 mL/min — ABNORMAL LOW (ref >60)
Total Bilirubin: 0.3 mg/dL (ref 0.3–1.2)

## 2010-06-22 LAB — DIFFERENTIAL
Basophils Relative: 0 % (ref 0–1)
Eosinophils Absolute: 0.5 10*3/uL (ref 0.0–0.7)
Eosinophils Relative: 6 % — ABNORMAL HIGH (ref 0–5)
Lymphs Abs: 0.6 10*3/uL — ABNORMAL LOW (ref 0.7–4.0)
Monocytes Absolute: 0.7 10*3/uL (ref 0.1–1.0)
Monocytes Relative: 9 % (ref 3–12)
Neutrophils Relative %: 76 % (ref 43–77)

## 2010-06-25 ENCOUNTER — Ambulatory Visit (HOSPITAL_COMMUNITY): Payer: Medicare Other

## 2010-07-02 ENCOUNTER — Telehealth: Payer: Self-pay | Admitting: Cardiology

## 2010-07-02 NOTE — Telephone Encounter (Signed)
I talked with pt's daughter. Pt fell about 2 weeks ago and had a chest xray that showed her heart was more enlarged than last chest xray. Daughter states pt seems to be improving but continues to have some SOB. O2 sat in the low 90s.  Pt has been seeing Dr Margo Aye  and the home health is seeing pt in her home. Daughter states Dr Margo Aye thought it would be a good idea for pt  to see Dr Daleen Squibb in follow-up. Daughter states Dr Daleen Squibb could see pt in [place] on 07/17/10 or see the PA sooner in [place].  I offered daughter appt for pt in [place] with Scott. Daughter states she will call [facility] and arrange an appt in the [place] office.

## 2010-07-02 NOTE — Telephone Encounter (Signed)
Pt had a fall and had an xray, it showed her heart was enlarged and she had been sob, oxygen levels have dropped-pls advise

## 2010-07-03 ENCOUNTER — Encounter (HOSPITAL_COMMUNITY): Payer: Medicare Other | Attending: Internal Medicine

## 2010-07-03 DIAGNOSIS — I129 Hypertensive chronic kidney disease with stage 1 through stage 4 chronic kidney disease, or unspecified chronic kidney disease: Secondary | ICD-10-CM | POA: Insufficient documentation

## 2010-07-03 DIAGNOSIS — N189 Chronic kidney disease, unspecified: Secondary | ICD-10-CM | POA: Insufficient documentation

## 2010-07-03 DIAGNOSIS — D631 Anemia in chronic kidney disease: Secondary | ICD-10-CM | POA: Insufficient documentation

## 2010-07-09 ENCOUNTER — Ambulatory Visit (INDEPENDENT_AMBULATORY_CARE_PROVIDER_SITE_OTHER): Payer: Medicare Other | Admitting: Adult Health

## 2010-07-09 ENCOUNTER — Encounter: Payer: Self-pay | Admitting: Adult Health

## 2010-07-09 DIAGNOSIS — I10 Essential (primary) hypertension: Secondary | ICD-10-CM

## 2010-07-09 DIAGNOSIS — D649 Anemia, unspecified: Secondary | ICD-10-CM

## 2010-07-09 DIAGNOSIS — I359 Nonrheumatic aortic valve disorder, unspecified: Secondary | ICD-10-CM

## 2010-07-09 DIAGNOSIS — I2510 Atherosclerotic heart disease of native coronary artery without angina pectoris: Secondary | ICD-10-CM

## 2010-07-09 NOTE — Assessment & Plan Note (Signed)
Will repeat echo to evaluate aortic valve in the setting of DOE which has worsened since she fell on month ago.  Uncertain if this is the etiology but she has not had an echo since November 2010.  Results will be reviewed by Dr. Daleen Squibb with further recommendations as his discretion if there are significant changes or worsening of the stenosis.

## 2010-07-09 NOTE — Assessment & Plan Note (Signed)
I will repeat CBC to evaluate Hgb status in the setting of increased shortness of breath.  Copy to be sent to Dr. Fausto Skillern.

## 2010-07-09 NOTE — Progress Notes (Signed)
HPI: [name] [name] is a very pleasant 77 y/o CF patient of Dr. Valera Castle we are following for ongoing assessment and treatment of moderate aortic valve stenosis per echo in Nov 2010; and carotid artery disease.  She comes today accompanied by her daughter. She reports a fall in May 2012 which required her to be seen in [facility]. They were on vacation and she mis-stepped landing on her right side, hip and sacral area, with some twisting of her left knee. She was evaluated by ER on Jun 22, 2010 and was not found to have any fx, in ribs, knee or back.  Since that time however, she states she has been more short of breath. She denies pain with inspiration or turning of her torso or bending of her knees.  She has become more sedentary per her daughter, and requires assistant climbing stairs secondary to shortness of breath.  She has a history of anemia in the setting of chronic renal insuffiencey and is followed by Dr. Fausto Skillern.  She receives injections of procaine every 6 wks.  Her daughter said that the most recent Hgb per Dr. Kristian Covey was 8.4.  She received a shot 2 weeks ago.    No Known Allergies  Current Outpatient Prescriptions  Medication Sig Dispense Refill  . acetaminophen (TYLENOL) 500 MG tablet Take 500 mg by mouth every 6 (six) hours as needed.        Marland Kitchen amLODipine (NORVASC) 10 MG tablet Take 10 mg by mouth daily.        . calcium-vitamin D (OSCAL WITH D) 500-200 MG-UNIT per tablet Take 1 tablet by mouth daily.        Marland Kitchen docusate sodium (COLACE) 100 MG capsule Take 100 mg by mouth 2 (two) times daily.        . hydrochlorothiazide (,MICROZIDE/HYDRODIURIL,) 12.5 MG capsule Take 12.5 mg by mouth daily.        Marland Kitchen levothyroxine (SYNTHROID, LEVOTHROID) 50 MCG tablet Take 50 mcg by mouth daily.        . metoprolol (TOPROL-XL) 100 MG 24 hr tablet Take 100 mg by mouth daily.        . Multiple Vitamins-Minerals (MULTIVITAMIN WITH MINERALS) tablet Take 1 tablet by mouth daily.        . Multiple Vitamins-Minerals  (VISION FORMULA PO) Take 1 tablet by mouth daily.        . ondansetron (ZOFRAN) 4 MG tablet Take 4 mg by mouth every 8 (eight) hours as needed.        . polyethylene glycol (MIRALAX / GLYCOLAX) packet Take 17 g by mouth daily.        Marland Kitchen PROCAINE HCL IJ Inject 1 applicator as directed. Every six weeks       . sertraline (ZOLOFT) 50 MG tablet Take 50 mg by mouth daily.        . traMADol (ULTRAM) 50 MG tablet Take 50 mg by mouth every 6 (six) hours as needed.          No past medical history on file.  No past surgical history on file.  JXB:JYNWGN of systems complete and found to be negative unless listed above PHYSICAL EXAM BP 112/60  Pulse 66  Ht 4\' 6"  (1.372 m)  Wt 101 lb (45.813 kg)  BMI 24.35 kg/m2  SpO2 90% General: Well developed, well nourished,fraile, in no acute distress Head: Eyes PERRLA, No xanthomas.   Normal cephalic and atramatic  Lungs: Clear bilaterally to auscultation and percussion. No pain with inspiration  Heart: HRRR S1 S2, 2/6 systolic murmur at at the RSL.  Pulses are 2+ & equal.            No carotid bruit. No JVD.  No abdominal bruits. No femoral bruits. Abdomen: Bowel sounds are positive, abdomen soft and non-tender without masses or                  Hernia's noted. Msk:  Back  Severe kyphosis is noted, slow gait. Diminishedl strength and tone for age. Extremities: No clubbing, cyanosis or edema.  DP +1 Neuro: Alert and oriented X 3. Psych:  Good affect, responds appropriately   ASSESSMENT AND PLAN

## 2010-07-09 NOTE — Assessment & Plan Note (Signed)
Well controlled at present.  No changes in medications at this time.

## 2010-07-09 NOTE — Patient Instructions (Signed)
**Note De-Identified [name] [name] Obfuscation** Your physician has requested that you have an echocardiogram. Echocardiography is a painless test that uses sound waves to create images of your heart. It provides your doctor with information about the size and shape of your heart and how well your heart's chambers and valves are working. This procedure takes approximately one hour. There are no restrictions for this procedure.  Your physician recommends that you return for lab work in: today  Your physician recommends that you continue on your current medications as directed. Please refer to the Current Medication list given to you today.  Your physician recommends that you schedule a follow-up appointment in: after Echo

## 2010-07-11 ENCOUNTER — Encounter: Payer: Self-pay | Admitting: Cardiology

## 2010-07-12 ENCOUNTER — Telehealth: Payer: Self-pay

## 2010-07-12 NOTE — Telephone Encounter (Signed)
Message copied by Demetrios Loll on Fri Jul 12, 2010  3:12 PM ------      Message from: Jodelle Gross      Created: Caleen Essex Jul 12, 2010  1:21 PM      Regarding:  CBC result       Please refer/fax this report to primary care MD, ie labs. Along with renal physician.  They may want to see her soon. Give her a call to let her know what her Hbg is so that she can call her physician too.            Joni Reining NP

## 2010-07-15 ENCOUNTER — Ambulatory Visit (HOSPITAL_COMMUNITY)
Admission: RE | Admit: 2010-07-15 | Discharge: 2010-07-15 | Disposition: A | Discharge status: Home / Self Care | Payer: Medicare Other | Source: Ambulatory Visit | Attending: Adult Health | Admitting: Adult Health

## 2010-07-15 DIAGNOSIS — I359 Nonrheumatic aortic valve disorder, unspecified: Secondary | ICD-10-CM

## 2010-07-15 DIAGNOSIS — I10 Essential (primary) hypertension: Secondary | ICD-10-CM | POA: Insufficient documentation

## 2010-07-16 ENCOUNTER — Ambulatory Visit: Payer: Medicare Other | Attending: Ophthalmology | Admitting: Occupational Therapy

## 2010-07-16 DIAGNOSIS — IMO0001 Reserved for inherently not codable concepts without codable children: Secondary | ICD-10-CM | POA: Insufficient documentation

## 2010-07-16 DIAGNOSIS — H53419 Scotoma involving central area, unspecified eye: Secondary | ICD-10-CM | POA: Insufficient documentation

## 2010-07-16 DIAGNOSIS — H3530 Unspecified macular degeneration: Secondary | ICD-10-CM | POA: Insufficient documentation

## 2010-07-23 ENCOUNTER — Ambulatory Visit (INDEPENDENT_AMBULATORY_CARE_PROVIDER_SITE_OTHER): Payer: Medicare Other | Admitting: Cardiology

## 2010-07-23 ENCOUNTER — Encounter: Payer: Self-pay | Admitting: Cardiology

## 2010-07-23 VITALS — BP 110/58 | HR 60 | Ht 60.0 in | Wt 103.0 lb

## 2010-07-23 DIAGNOSIS — I6529 Occlusion and stenosis of unspecified carotid artery: Secondary | ICD-10-CM

## 2010-07-23 DIAGNOSIS — I359 Nonrheumatic aortic valve disorder, unspecified: Secondary | ICD-10-CM

## 2010-07-23 DIAGNOSIS — I10 Essential (primary) hypertension: Secondary | ICD-10-CM

## 2010-07-23 MED ORDER — METOPROLOL SUCCINATE ER 50 MG PO TB24: 50.0000 mg | ORAL_TABLET | Freq: Every day | ORAL | Status: DC

## 2010-07-23 MED ORDER — AMLODIPINE BESYLATE 5 MG PO TABS: 5.0000 mg | ORAL_TABLET | Freq: Every day | ORAL | Status: DC

## 2010-07-23 NOTE — Assessment & Plan Note (Signed)
Stable, continue medical therapy. 

## 2010-07-23 NOTE — Assessment & Plan Note (Signed)
Her blood pressure is probably too low for her. She clearly is having some orthostatic symptoms. I will decrease her metoprolol extended release 250 mg each day, decrease her amlodipine to 5 mg per day, and discontinue her hydrochlorothiazide. I doubt the latter is doing anything to control fluid balance.  We'll have her return in a week for blood pressure check and heart rate check.  This should hopefully help her symptoms and avoid a fall.

## 2010-07-23 NOTE — Progress Notes (Signed)
HPI [name] [name] comes in for evaluation management of her mild aortic stenosis, dyspnea on exertion, and hypertension.  She was seen by [name] [name] on June 12. An echocardiogram was obtained which showed mild aortic stenosis which was stable. She has moderate left ventricular hypertrophy and ejection fraction is 55-60%. There was a very small akinetic area of the inferior [name] [name].  Her biggest complaint is getting lightheaded when she stands up. She is on a lot of antihypertensive medicines at this point particularly at age 77. She denies any edema. She has chronic renal insufficiency with a creatinine about 1.9. In addition she has chronic anemia with her last hemoglobin being 8.9. She does have chronic dyspnea on exertion but is fairly inactive.  Shotty chest pain or angina. No past medical history on file.  No past surgical history on file.  No family history on file.  History   Social History  . Marital Status: Widowed    Spouse Name: N/A    Number of Children: N/A  . Years of Education: N/A   Occupational History  . Not on file.   Social History Main Topics  . Smoking status: Never Smoker   . Smokeless tobacco: Never Used  . Alcohol Use: No  . Drug Use: No  . Sexually Active: Not on file   Other Topics Concern  . Not on file   Social History Narrative  . No narrative on file    No Known Allergies  Current Outpatient Prescriptions  Medication Sig Dispense Refill  . acetaminophen (TYLENOL) 500 MG tablet Take 500 mg by mouth every 6 (six) hours as needed.        Marland Kitchen amLODipine (NORVASC) 10 MG tablet Take 10 mg by mouth daily.        Marland Kitchen aspirin 81 MG tablet Take 81 mg by mouth daily.        . calcium-vitamin D (OSCAL WITH D) 500-200 MG-UNIT per tablet Take 1 tablet by mouth daily.        Marland Kitchen docusate sodium (COLACE) 100 MG capsule Take 100 mg by mouth 2 (two) times daily.        Marland Kitchen levothyroxine (SYNTHROID, LEVOTHROID) 50 MCG tablet Take 50 mcg by mouth daily.        .  metoprolol (TOPROL-XL) 100 MG 24 hr tablet Take 100 mg by mouth daily.        . Multiple Vitamins-Minerals (MULTIVITAMIN WITH MINERALS) tablet Take 1 tablet by mouth daily.        . Multiple Vitamins-Minerals (VISION FORMULA PO) Take 1 tablet by mouth daily.        . ondansetron (ZOFRAN) 4 MG tablet Take 4 mg by mouth every 8 (eight) hours as needed.        . polyethylene glycol (MIRALAX / GLYCOLAX) packet Take 17 g by mouth daily.        Marland Kitchen PROCAINE HCL IJ Inject 1 applicator as directed. Every six weeks       . sertraline (ZOLOFT) 50 MG tablet Take 50 mg by mouth daily.        . traMADol (ULTRAM) 50 MG tablet Take 50 mg by mouth every 6 (six) hours as needed.        Marland Kitchen DISCONTD: hydrochlorothiazide (,MICROZIDE/HYDRODIURIL,) 12.5 MG capsule Take 12.5 mg by mouth daily.          ROS Negative other than HPI.   PE General Appearance: well developed, Thin, frail, in no acute distress HEENT: symmetrical face, PERRLA, good  dentition  Neck: no JVD, thyromegaly, or adenopathy, trachea midline Chest: symmetric without deformity Cardiac: PMI non-displaced, RRR, normal S1, S2, no gallop, 2/6 systolic murmur consistent with aortic stenosis, no diastolic component Lung: clear to ausculation and percussion Vascular: all pulses full without bruits  Abdominal: nondistended, nontender, good bowel sounds, no HSM, no bruits Extremities: no cyanosis, clubbing or edema, no sign of DVT, Skin is thin with some bruises. She has marked varicose veins Skin: normal color, no rashes Neuro: alert and oriented x 3, non-focal Pysch: normal affect Filed Vitals:   07/23/10 1120  BP: 110/58  Pulse: 60  Height: 5' (1.524 m)  Weight: 103 lb (46.72 kg)  SpO2: 92%    EKG  Labs and Studies Reviewed.   Lab Results  Component Value Date   WBC 7.7 06/22/2010   HGB 10.4* 06/22/2010   HCT 31.3* 06/22/2010   MCV 91.8 06/22/2010   PLT 168 06/22/2010      Chemistry      Component Value Date/Time   NA 136 06/22/2010  1303   K 3.9 06/22/2010 1303   CL 97 06/22/2010 1303   CO2 30 06/22/2010 1303   BUN 26* 06/22/2010 1303   CREATININE 1.63* 06/22/2010 1303      Component Value Date/Time   CALCIUM 10.6* 06/22/2010 1303   ALKPHOS 68 06/22/2010 1303   AST 23 06/22/2010 1303   ALT 12 06/22/2010 1303   BILITOT 0.3 06/22/2010 1303       No results found for this basename: CHOL   No results found for this basename: HDL   No results found for this basename: LDLCALC   No results found for this basename: TRIG   No results found for this basename: CHOLHDL   No results found for this basename: HGBA1C   Lab Results  Component Value Date   ALT 12 06/22/2010   AST 23 06/22/2010   ALKPHOS 68 06/22/2010   BILITOT 0.3 06/22/2010   Lab Results  Component Value Date   TSH 3.277 **Test methodology is 3rd generation TSH** 03/08/2008  .Marland KitchenMarland Kitchen

## 2010-07-23 NOTE — Patient Instructions (Signed)
**Note De-Identified [name] [name] Obfuscation** Your physician has recommended you make the following change in your medication: Stop taking Hydrochlorathiazide, decrease Metoprolol to 50mg  daily and Amlodipine to 5 mg daily  Your physician recommends that you schedule a follow-up appointment in: 1 week for a blood pressure check with nurse and in 6 months

## 2010-07-24 ENCOUNTER — Other Ambulatory Visit (HOSPITAL_COMMUNITY): Payer: Self-pay | Admitting: Oncology

## 2010-07-24 ENCOUNTER — Encounter (HOSPITAL_COMMUNITY): Payer: Medicare Other

## 2010-07-24 LAB — RENAL FUNCTION PANEL
Albumin: 3.7 g/dL (ref 3.5–5.2)
Calcium: 9.9 mg/dL (ref 8.4–10.5)
Chloride: 98 mEq/L (ref 96–112)
Creatinine, Ser: 1.93 mg/dL — ABNORMAL HIGH (ref 0.50–1.10)
GFR calc Af Amer: 29 mL/min — ABNORMAL LOW (ref >60)
GFR calc non Af Amer: 24 mL/min — ABNORMAL LOW (ref >60)
Sodium: 137 mEq/L (ref 135–145)

## 2010-07-24 LAB — HEMOGLOBIN AND HEMATOCRIT, BLOOD
HCT: 29.9 % — ABNORMAL LOW (ref 36.0–46.0)
Hemoglobin: 9.7 g/dL — ABNORMAL LOW (ref 12.0–15.0)

## 2010-07-30 ENCOUNTER — Ambulatory Visit (INDEPENDENT_AMBULATORY_CARE_PROVIDER_SITE_OTHER): Payer: Medicare Other

## 2010-07-30 DIAGNOSIS — I10 Essential (primary) hypertension: Secondary | ICD-10-CM

## 2010-07-30 NOTE — Progress Notes (Signed)
**Note De-Identified Loralie Malta Obfuscation** S: pt. Arrives in office for a 1 week BP and HR check with nurse. B: On last OV with Dr. Daleen Squibb on 07-23-10 pt. was advised to stop taking Hydrochlorathiazide, decrease Metoproolol to 50mg  daily and Amlodipine to 5mg  daily. A: Pt. has no complaints at this time other than dizziness when standing (her bp was 136/79 when she stood). Her BP today is 145/81 with a HR of 72 and on last OV BP was110/58 with a HR of 60. She brought in her medication bottles and is taking as directed. R: Pt. advised to continue on current medical treatment and that we will contact her with Dr. Vern Claude recommendations, if any.

## 2010-08-13 ENCOUNTER — Encounter (HOSPITAL_COMMUNITY): Payer: Self-pay

## 2010-08-14 ENCOUNTER — Encounter (HOSPITAL_COMMUNITY): Payer: Medicare Other | Attending: Internal Medicine

## 2010-08-14 ENCOUNTER — Encounter (HOSPITAL_COMMUNITY): Payer: Medicare Other

## 2010-08-14 VITALS — BP 176/73 | HR 84

## 2010-08-14 DIAGNOSIS — D631 Anemia in chronic kidney disease: Secondary | ICD-10-CM | POA: Insufficient documentation

## 2010-08-14 DIAGNOSIS — I129 Hypertensive chronic kidney disease with stage 1 through stage 4 chronic kidney disease, or unspecified chronic kidney disease: Secondary | ICD-10-CM | POA: Insufficient documentation

## 2010-08-14 DIAGNOSIS — N189 Chronic kidney disease, unspecified: Secondary | ICD-10-CM | POA: Insufficient documentation

## 2010-08-14 DIAGNOSIS — D649 Anemia, unspecified: Secondary | ICD-10-CM

## 2010-08-14 LAB — RENAL FUNCTION PANEL
BUN: 23 mg/dL (ref 6–23)
CO2: 31 mEq/L (ref 19–32)
Calcium: 10 mg/dL (ref 8.4–10.5)
Creatinine, Ser: 1.58 mg/dL — ABNORMAL HIGH (ref 0.50–1.10)
Glucose, Bld: 87 mg/dL (ref 70–99)

## 2010-08-14 LAB — HEMOGLOBIN AND HEMATOCRIT, BLOOD
HCT: 31.9 % — ABNORMAL LOW (ref 36.0–46.0)
Hemoglobin: 10.3 g/dL — ABNORMAL LOW (ref 12.0–15.0)

## 2010-08-14 MED ORDER — DARBEPOETIN ALFA-POLYSORBATE 100 MCG/0.5ML IJ SOLN
INTRAMUSCULAR | Status: AC
  Administered 2010-08-14: 100 ug via SUBCUTANEOUS
  Filled 2010-08-14: qty 0.5

## 2010-08-14 MED ORDER — DARBEPOETIN ALFA-POLYSORBATE 100 MCG/ML IJ SOLN: 100.0000 ug | Freq: Once | INTRAMUSCULAR | Status: AC

## 2010-08-14 NOTE — Progress Notes (Signed)
Labs drawn today for hh , renal 

## 2010-09-04 ENCOUNTER — Ambulatory Visit (HOSPITAL_COMMUNITY): Payer: Medicare Other

## 2010-09-05 ENCOUNTER — Other Ambulatory Visit (HOSPITAL_COMMUNITY): Payer: Self-pay | Admitting: Oncology

## 2010-09-05 ENCOUNTER — Encounter (HOSPITAL_COMMUNITY): Payer: Medicare Other | Attending: Internal Medicine

## 2010-09-05 ENCOUNTER — Encounter (HOSPITAL_COMMUNITY): Payer: Medicare Other

## 2010-09-05 DIAGNOSIS — D649 Anemia, unspecified: Secondary | ICD-10-CM

## 2010-09-05 DIAGNOSIS — N289 Disorder of kidney and ureter, unspecified: Secondary | ICD-10-CM

## 2010-09-05 DIAGNOSIS — N189 Chronic kidney disease, unspecified: Secondary | ICD-10-CM | POA: Insufficient documentation

## 2010-09-05 DIAGNOSIS — I129 Hypertensive chronic kidney disease with stage 1 through stage 4 chronic kidney disease, or unspecified chronic kidney disease: Secondary | ICD-10-CM | POA: Insufficient documentation

## 2010-09-05 DIAGNOSIS — D631 Anemia in chronic kidney disease: Secondary | ICD-10-CM | POA: Insufficient documentation

## 2010-09-05 LAB — RENAL FUNCTION PANEL
BUN: 25 mg/dL — ABNORMAL HIGH (ref 6–23)
Calcium: 9.7 mg/dL (ref 8.4–10.5)
Glucose, Bld: 90 mg/dL (ref 70–99)
Phosphorus: 3.9 mg/dL (ref 2.3–4.6)
Sodium: 139 mEq/L (ref 135–145)

## 2010-09-05 MED ORDER — DARBEPOETIN ALFA-POLYSORBATE 100 MCG/0.5ML IJ SOLN
INTRAMUSCULAR | Status: AC
  Filled 2010-09-05: qty 0.5

## 2010-09-05 MED ORDER — DARBEPOETIN ALFA-POLYSORBATE 100 MCG/0.5ML IJ SOLN
100.0000 ug | Freq: Once | INTRAMUSCULAR | Status: AC
  Administered 2010-09-05: 100 ug via SUBCUTANEOUS

## 2010-09-05 NOTE — Progress Notes (Signed)
Labs drawn today for h&h, and renal 

## 2010-09-05 NOTE — Progress Notes (Signed)
VSS. Aranesp subq upper left arm. Tolerated well.

## 2010-09-06 ENCOUNTER — Ambulatory Visit (HOSPITAL_COMMUNITY): Payer: Medicare Other

## 2010-09-26 ENCOUNTER — Other Ambulatory Visit (HOSPITAL_COMMUNITY): Payer: Medicare Other

## 2010-09-26 ENCOUNTER — Ambulatory Visit (HOSPITAL_COMMUNITY): Payer: Medicare Other

## 2010-10-16 LAB — RENAL FUNCTION PANEL
CO2: 28
Glucose, Bld: 121 — ABNORMAL HIGH
Potassium: 3.5
Sodium: 137

## 2010-10-18 LAB — RENAL FUNCTION PANEL
Albumin: 3.5
CO2: 32
Chloride: 99
GFR calc Af Amer: 34 — ABNORMAL LOW
GFR calc non Af Amer: 28 — ABNORMAL LOW
Potassium: 3.8

## 2010-10-18 LAB — HEMOGLOBIN AND HEMATOCRIT, BLOOD
HCT: 33.4 — ABNORMAL LOW
Hemoglobin: 11.6 — ABNORMAL LOW

## 2010-10-24 ENCOUNTER — Encounter (HOSPITAL_COMMUNITY): Payer: Medicare Other | Attending: Nephrology

## 2010-10-24 DIAGNOSIS — N259 Disorder resulting from impaired renal tubular function, unspecified: Secondary | ICD-10-CM | POA: Insufficient documentation

## 2010-10-24 DIAGNOSIS — D649 Anemia, unspecified: Secondary | ICD-10-CM

## 2010-10-24 LAB — RENAL FUNCTION PANEL
Albumin: 3.4 g/dL — ABNORMAL LOW (ref 3.5–5.2)
BUN: 27 mg/dL — ABNORMAL HIGH (ref 6–23)
BUN: 20
CO2: 31
Calcium: 9.9 mg/dL (ref 8.4–10.5)
Calcium: 9.8
Chloride: 104 mEq/L (ref 96–112)
Creatinine, Ser: 1.89 mg/dL — ABNORMAL HIGH (ref 0.50–1.10)
GFR calc non Af Amer: 25 mL/min — ABNORMAL LOW (ref >60)
Glucose, Bld: 116 — ABNORMAL HIGH
Phosphorus: 3.6 mg/dL (ref 2.3–4.6)
Phosphorus: 3.9
Sodium: 136

## 2010-10-24 LAB — HEMOGLOBIN AND HEMATOCRIT, BLOOD
HCT: 28.1 — ABNORMAL LOW
HCT: 34.6 % — ABNORMAL LOW (ref 36.0–46.0)
Hemoglobin: 10 — ABNORMAL LOW
Hemoglobin: 11.2 g/dL — ABNORMAL LOW (ref 12.0–15.0)
Hemoglobin: 9.8 — ABNORMAL LOW

## 2010-10-24 MED ORDER — DARBEPOETIN ALFA-POLYSORBATE 100 MCG/0.5ML IJ SOLN
100.0000 ug | Freq: Once | INTRAMUSCULAR | Status: AC
  Administered 2010-10-24: 100 ug via SUBCUTANEOUS

## 2010-10-24 MED ORDER — DARBEPOETIN ALFA-POLYSORBATE 100 MCG/0.5ML IJ SOLN
INTRAMUSCULAR | Status: AC
  Administered 2010-10-24: 100 ug via SUBCUTANEOUS
  Filled 2010-10-24: qty 0.5

## 2010-10-24 NOTE — Progress Notes (Signed)
[name] [name] presents today for injection per MD orders. Aranesp 100 mcg administered SQ in right Upper Arm. Administration without incident. Patient tolerated well.

## 2010-10-24 NOTE — Progress Notes (Signed)
[name] [name] presented for labwork. Labs per MD order drawn via Peripheral Line 25 gauge needle inserted in rt arm.   Procedure without incident.  Needle removed intact. Patient tolerated procedure well.

## 2010-10-25 LAB — RENAL FUNCTION PANEL
Albumin: 3.4 — ABNORMAL LOW
Calcium: 9.5
Chloride: 102
Creatinine, Ser: 1.6 — ABNORMAL HIGH
GFR calc Af Amer: 37 — ABNORMAL LOW
GFR calc non Af Amer: 30 — ABNORMAL LOW
Sodium: 136

## 2010-10-25 LAB — HEMOGLOBIN AND HEMATOCRIT, BLOOD: HCT: 30.6 — ABNORMAL LOW

## 2010-10-29 LAB — HEMOGLOBIN AND HEMATOCRIT, BLOOD: Hemoglobin: 10.2 — ABNORMAL LOW

## 2010-10-29 LAB — RENAL FUNCTION PANEL
CO2: 31
Calcium: 9.2
Creatinine, Ser: 1.77 — ABNORMAL HIGH
GFR calc Af Amer: 33 — ABNORMAL LOW
Glucose, Bld: 109 — ABNORMAL HIGH
Phosphorus: 3.6

## 2010-11-04 LAB — RENAL FUNCTION PANEL
CO2: 29
Glucose, Bld: 149 — ABNORMAL HIGH
Potassium: 3.8
Sodium: 137

## 2010-11-05 LAB — RENAL FUNCTION PANEL
Albumin: 3.5
GFR calc Af Amer: 32 — ABNORMAL LOW
GFR calc non Af Amer: 26 — ABNORMAL LOW
Phosphorus: 3.3
Potassium: 4.2
Sodium: 138

## 2010-11-05 LAB — HEMOGLOBIN AND HEMATOCRIT, BLOOD
HCT: 26.9 — ABNORMAL LOW
Hemoglobin: 9.2 — ABNORMAL LOW

## 2010-11-11 LAB — BASIC METABOLIC PANEL
Calcium: 9.3
GFR calc Af Amer: 38 — ABNORMAL LOW
GFR calc non Af Amer: 31 — ABNORMAL LOW
Glucose, Bld: 121 — ABNORMAL HIGH
Sodium: 138

## 2010-11-11 LAB — RENAL FUNCTION PANEL
Albumin: 3.3 — ABNORMAL LOW
CO2: 26
Calcium: 9
GFR calc Af Amer: 31 — ABNORMAL LOW
GFR calc non Af Amer: 26 — ABNORMAL LOW
Phosphorus: 3.8
Sodium: 135

## 2010-11-11 LAB — DIFFERENTIAL
Basophils Absolute: 0
Lymphocytes Relative: 14
Monocytes Absolute: 0.4
Neutro Abs: 4.3

## 2010-11-11 LAB — CBC
Hemoglobin: 11.4 — ABNORMAL LOW
RDW: 14.2 — ABNORMAL HIGH

## 2010-11-11 LAB — HEMOGLOBIN AND HEMATOCRIT, BLOOD: Hemoglobin: 10.1 — ABNORMAL LOW

## 2010-12-05 ENCOUNTER — Other Ambulatory Visit (HOSPITAL_COMMUNITY): Payer: Self-pay | Admitting: *Deleted

## 2010-12-05 ENCOUNTER — Encounter (HOSPITAL_COMMUNITY): Payer: Medicare Other | Attending: Nephrology

## 2010-12-05 DIAGNOSIS — D649 Anemia, unspecified: Secondary | ICD-10-CM | POA: Insufficient documentation

## 2010-12-05 LAB — RENAL FUNCTION PANEL
Albumin: 3.4 g/dL — ABNORMAL LOW (ref 3.5–5.2)
BUN: 22 mg/dL (ref 6–23)
Calcium: 9.9 mg/dL (ref 8.4–10.5)
Creatinine, Ser: 1.72 mg/dL — ABNORMAL HIGH (ref 0.50–1.10)
Phosphorus: 3.7 mg/dL (ref 2.3–4.6)

## 2010-12-05 LAB — HEMOGLOBIN AND HEMATOCRIT, BLOOD: Hemoglobin: 10.6 g/dL — ABNORMAL LOW (ref 12.0–15.0)

## 2010-12-05 NOTE — Progress Notes (Signed)
Pt needs appt to get aranesp. misc clinic inj

## 2010-12-05 NOTE — Progress Notes (Signed)
Aranesp held due to last hgb 11.2.  Labs done today.. Will call pt with results and set up shot.

## 2010-12-10 ENCOUNTER — Encounter (HOSPITAL_COMMUNITY): Payer: Medicare Other

## 2010-12-10 VITALS — BP 150/78 | HR 60

## 2010-12-10 DIAGNOSIS — D649 Anemia, unspecified: Secondary | ICD-10-CM

## 2010-12-10 MED ORDER — DARBEPOETIN ALFA-POLYSORBATE 100 MCG/0.5ML IJ SOLN
100.0000 ug | Freq: Once | INTRAMUSCULAR | Status: AC
  Administered 2010-12-10: 100 ug via SUBCUTANEOUS

## 2010-12-10 NOTE — Progress Notes (Signed)
Tolerated injection well. 

## 2010-12-26 ENCOUNTER — Other Ambulatory Visit: Payer: Self-pay | Admitting: Dermatology

## 2011-01-27 ENCOUNTER — Encounter: Payer: Self-pay | Admitting: Cardiology

## 2011-01-29 DIAGNOSIS — T148 Other injury of unspecified body region: Secondary | ICD-10-CM | POA: Diagnosis not present

## 2011-01-29 DIAGNOSIS — W57XXXA Bitten or stung by nonvenomous insect and other nonvenomous arthropods, initial encounter: Secondary | ICD-10-CM | POA: Diagnosis not present

## 2011-02-04 ENCOUNTER — Ambulatory Visit (HOSPITAL_COMMUNITY): Payer: Medicare Other

## 2011-02-13 ENCOUNTER — Encounter (HOSPITAL_COMMUNITY): Payer: Medicare Other | Attending: Nephrology

## 2011-02-13 DIAGNOSIS — D649 Anemia, unspecified: Secondary | ICD-10-CM | POA: Insufficient documentation

## 2011-02-13 LAB — RENAL FUNCTION PANEL
BUN: 20 mg/dL (ref 6–23)
Calcium: 10.2 mg/dL (ref 8.4–10.5)
GFR calc Af Amer: 32 mL/min — ABNORMAL LOW (ref >90)
Glucose, Bld: 84 mg/dL (ref 70–99)
Phosphorus: 3.8 mg/dL (ref 2.3–4.6)

## 2011-02-13 LAB — HEMOGLOBIN AND HEMATOCRIT, BLOOD
HCT: 30.7 % — ABNORMAL LOW (ref 36.0–46.0)
Hemoglobin: 10 g/dL — ABNORMAL LOW (ref 12.0–15.0)

## 2011-02-13 MED ORDER — DARBEPOETIN ALFA-POLYSORBATE 100 MCG/0.5ML IJ SOLN
100.0000 ug | Freq: Once | INTRAMUSCULAR | Status: AC
  Administered 2011-02-13: 100 ug via SUBCUTANEOUS

## 2011-02-13 NOTE — Progress Notes (Signed)
[name] [name] presents today for injection per MD orders. Aranesp administered SQ in left Upper Arm. Administration without incident. Patient tolerated well.

## 2011-03-10 DIAGNOSIS — H353290 Exudative age-related macular degeneration, unspecified eye, stage unspecified: Secondary | ICD-10-CM | POA: Diagnosis not present

## 2011-03-10 DIAGNOSIS — H35059 Retinal neovascularization, unspecified, unspecified eye: Secondary | ICD-10-CM | POA: Diagnosis not present

## 2011-03-10 DIAGNOSIS — H35359 Cystoid macular degeneration, unspecified eye: Secondary | ICD-10-CM | POA: Diagnosis not present

## 2011-03-21 ENCOUNTER — Ambulatory Visit: Payer: Medicare Other | Admitting: Cardiology

## 2011-03-27 ENCOUNTER — Ambulatory Visit: Payer: Medicare Other | Admitting: Adult Health

## 2011-03-28 ENCOUNTER — Encounter: Payer: Self-pay | Admitting: Adult Health

## 2011-03-28 ENCOUNTER — Ambulatory Visit (INDEPENDENT_AMBULATORY_CARE_PROVIDER_SITE_OTHER): Payer: Medicare Other | Admitting: Adult Health

## 2011-03-28 DIAGNOSIS — L0889 Other specified local infections of the skin and subcutaneous tissue: Secondary | ICD-10-CM | POA: Diagnosis not present

## 2011-03-28 DIAGNOSIS — I359 Nonrheumatic aortic valve disorder, unspecified: Secondary | ICD-10-CM

## 2011-03-28 DIAGNOSIS — I669 Occlusion and stenosis of unspecified cerebral artery: Secondary | ICD-10-CM | POA: Diagnosis not present

## 2011-03-28 DIAGNOSIS — L989 Disorder of the skin and subcutaneous tissue, unspecified: Secondary | ICD-10-CM | POA: Diagnosis not present

## 2011-03-28 DIAGNOSIS — I060 Rheumatic aortic stenosis: Secondary | ICD-10-CM

## 2011-03-28 DIAGNOSIS — I10 Essential (primary) hypertension: Secondary | ICD-10-CM | POA: Diagnosis not present

## 2011-03-28 NOTE — Assessment & Plan Note (Signed)
Echo is planned. She will see Dr.Wall on next visit.

## 2011-03-28 NOTE — Assessment & Plan Note (Signed)
Blood pressure is clearly not well controlled at present. She is requiring doses of hydralazine for BP elevations during the day. She has been having some dizziness and some weakness. I will change her amlodipine to AM dosing from PM dosing. She will continue to take hydralazine if need for BP < 160 mmHg.  I will have carotid studies, a renal artery ultrasound and echo completed as these have not been done since 2010. She will call for BP elevation or hypotension should daily amlodipine dosing cause her to have issues.

## 2011-03-28 NOTE — Patient Instructions (Signed)
Your physician recommends that you schedule a follow-up appointment in: 2 weeks with Dr Daleen Squibb  Your physician has requested that you have a carotid duplex. This test is an ultrasound of the carotid arteries in your neck. It looks at blood flow through these arteries that supply the brain with blood. Allow one hour for this exam. There are no restrictions or special instructions.  Renal Artery Ultrasound  Your physician has requested that you have an echocardiogram. Echocardiography is a painless test that uses sound waves to create images of your heart. It provides your doctor with information about the size and shape of your heart and how well your heart's chambers and valves are working. This procedure takes approximately one hour. There are no restrictions for this procedure.  Your physician recommends that you return for lab work in: Today

## 2011-03-28 NOTE — Progress Notes (Signed)
HPI:[name] is a very pleasant 78 y/o CF patient of Dr. Valera [name] we are following for ongoing assessment and treatment of moderate aortic valve stenosis per echo in Nov 2010; and carotid artery disease. She comes today accompanied by her daughter. She reports a fall in May 2012 which required her to be seen in [facility]. They were on vacation and she mis-stepped landing on her right side, hip and sacral area, with some twisting of her left knee. She was evaluated by ER on Jun 22, 2010 and was not found to have any fx, in ribs, knee or back. Since that time however, she states she has been more short of breath. She denies pain with inspiration or turning of her torso or bending of her knees. She has become more sedentary per her daughter, and requires assistant climbing stairs secondary to shortness of breath. She has a history of anemia in the setting of chronic renal insuffiencey and is followed by Dr. Fausto [name].     She comes today with complaints of headache, elevated BP in the afternoons requiring hydralazine to bring down. She received this from Dr. Margo [name]. Had to give this to her at least once a day in the afternoon. BP has been as high as 210 systolic. Hydralazine is helpful. She is usually taking her meds at night time. BP is fine in the am.  No Known Allergies  Current Outpatient Prescriptions  Medication Sig Dispense Refill  . acetaminophen (TYLENOL) 500 MG tablet Take 500 mg by mouth every 6 (six) hours as needed.        Marland Kitchen amLODipine (NORVASC) 5 MG tablet Take 10 mg by mouth daily.      Marland Kitchen aspirin 81 MG tablet Take 81 mg by mouth daily.        . Bevacizumab (AVASTIN IV) Inject as directed.      Marland Kitchen BIOTIN PO Take by mouth daily.      . calcium-vitamin D (OSCAL WITH D) 500-200 MG-UNIT per tablet Take 1 tablet by mouth daily.        . darbepoetin alfa-polysorbate (ARANESP, ALBUMIN FREE,) 100 MCG/ML injection Inject 1 mL (100 mcg total) into the skin once.  0.4 mL  0  . docusate sodium (COLACE)  100 MG capsule Take 100 mg by mouth 2 (two) times daily.        . hydrALAZINE (APRESOLINE) 10 MG tablet Take 10 mg by mouth as directed.      . hydrochlorothiazide (MICROZIDE) 12.5 MG capsule Take 12.5 mg by mouth daily.      Marland Kitchen levothyroxine (SYNTHROID, LEVOTHROID) 50 MCG tablet Take 50 mcg by mouth daily.        . metoprolol succinate (TOPROL-XL) 50 MG 24 hr tablet Take 100 mg by mouth daily.      . Multiple Vitamins-Minerals (MULTIVITAMIN WITH MINERALS) tablet Take 1 tablet by mouth daily.        . Multiple Vitamins-Minerals (VISION FORMULA PO) Take 1 tablet by mouth daily.        Marland Kitchen omeprazole (PRILOSEC) 20 MG capsule Take 20 mg by mouth daily.      . ondansetron (ZOFRAN) 4 MG tablet Take 4 mg by mouth every 8 (eight) hours as needed.        . polyethylene glycol (MIRALAX / GLYCOLAX) packet Take 17 g by mouth daily.        . sertraline (ZOLOFT) 50 MG tablet Take 50 mg by mouth daily.        Marland Kitchen  traMADol (ULTRAM) 50 MG tablet Take 50 mg by mouth every 6 (six) hours as needed.        Marland Kitchen DISCONTD: amLODipine (NORVASC) 5 MG tablet Take 1 tablet (5 mg total) by mouth daily.  30 tablet  6  . DISCONTD: metoprolol (TOPROL-XL) 50 MG 24 hr tablet Take 1 tablet (50 mg total) by mouth daily.  30 tablet  6    Past Medical History  Diagnosis Date  . Backache, unspecified   . Dizziness and giddiness   . Thyrotoxicosis without mention of goiter or other cause, without mention of thyrotoxic crisis or storm   . Unspecified disorder resulting from impaired renal function   . Anemia, unspecified   . Unspecified essential hypertension     No past surgical history on file.  EAV:[id number] of systems complete and found to be negative unless listed above  PHYSICAL EXAM BP 170/70  Pulse 61  Resp 16  Ht 5' (1.524 m)  Wt 105 lb (47.628 kg)  BMI 20.51 kg/m2  General: Well developed, well nourished, in no acute distress Head: Eyes PERRLA, No xanthomas.   Normal cephalic and atramatic  Lungs: Clear bilaterally  to auscultation and percussion. Heart: HRRR S1 S2, 2/6 systolic murmur.  Pulses are 2+ & equal.            No carotid bruit. No JVD.  No abdominal bruits. No femoral bruits. Abdomen: Bowel sounds are positive, abdomen soft and non-tender without masses or                  Hernia's noted. Msk: Kyphosis back, slow gait.Normal strength and tone for age. Extremities: No clubbing, cyanosis or edema.  DP +1 Neuro: Alert and oriented X 3. Psych:  Good affect, responds appropriately  EKG: NSR with Nonspecific T-wave abnormality in the lateral leads. Flattening.  ASSESSMENT AND PLAN

## 2011-04-01 ENCOUNTER — Telehealth: Payer: Self-pay

## 2011-04-01 ENCOUNTER — Ambulatory Visit (HOSPITAL_COMMUNITY)
Admission: RE | Admit: 2011-04-01 | Discharge: 2011-04-01 | Disposition: A | Discharge status: Home / Self Care | Payer: Medicare Other | Source: Ambulatory Visit | Attending: Adult Health | Admitting: Adult Health

## 2011-04-01 ENCOUNTER — Other Ambulatory Visit: Payer: Self-pay | Admitting: Adult Health

## 2011-04-01 DIAGNOSIS — R42 Dizziness and giddiness: Secondary | ICD-10-CM | POA: Diagnosis not present

## 2011-04-01 DIAGNOSIS — I060 Rheumatic aortic stenosis: Secondary | ICD-10-CM

## 2011-04-01 DIAGNOSIS — I669 Occlusion and stenosis of unspecified cerebral artery: Secondary | ICD-10-CM | POA: Diagnosis not present

## 2011-04-01 DIAGNOSIS — I6529 Occlusion and stenosis of unspecified carotid artery: Secondary | ICD-10-CM | POA: Diagnosis not present

## 2011-04-01 DIAGNOSIS — I359 Nonrheumatic aortic valve disorder, unspecified: Secondary | ICD-10-CM

## 2011-04-01 DIAGNOSIS — G459 Transient cerebral ischemic attack, unspecified: Secondary | ICD-10-CM | POA: Diagnosis not present

## 2011-04-01 DIAGNOSIS — I10 Essential (primary) hypertension: Secondary | ICD-10-CM

## 2011-04-01 NOTE — Progress Notes (Signed)
*  PRELIMINARY RESULTS* Echocardiogram 2D Echocardiogram has been performed.  [name] [name] 04/01/2011, 1:38 PM

## 2011-04-02 ENCOUNTER — Encounter: Payer: Self-pay | Admitting: *Deleted

## 2011-04-02 ENCOUNTER — Other Ambulatory Visit: Payer: Self-pay | Admitting: *Deleted

## 2011-04-02 DIAGNOSIS — H409 Unspecified glaucoma: Secondary | ICD-10-CM | POA: Diagnosis not present

## 2011-04-02 DIAGNOSIS — H4011X0 Primary open-angle glaucoma, stage unspecified: Secondary | ICD-10-CM | POA: Diagnosis not present

## 2011-04-02 DIAGNOSIS — Z961 Presence of intraocular lens: Secondary | ICD-10-CM | POA: Diagnosis not present

## 2011-04-02 DIAGNOSIS — H538 Other visual disturbances: Secondary | ICD-10-CM | POA: Diagnosis not present

## 2011-04-02 DIAGNOSIS — H524 Presbyopia: Secondary | ICD-10-CM | POA: Diagnosis not present

## 2011-04-02 LAB — BASIC METABOLIC PANEL
BUN: 25 mg/dL — ABNORMAL HIGH (ref 6–23)
CO2: 28 mEq/L (ref 19–32)
Calcium: 9.9 mg/dL (ref 8.4–10.5)
Chloride: 100 mEq/L (ref 96–112)
Creat: 1.6 mg/dL — ABNORMAL HIGH (ref 0.50–1.10)

## 2011-04-07 ENCOUNTER — Other Ambulatory Visit: Payer: Self-pay | Admitting: Cardiology

## 2011-04-07 DIAGNOSIS — I701 Atherosclerosis of renal artery: Secondary | ICD-10-CM

## 2011-04-10 ENCOUNTER — Encounter (HOSPITAL_COMMUNITY): Payer: Medicare Other | Attending: Nephrology

## 2011-04-10 VITALS — BP 152/71 | HR 62 | Temp 97.6°F

## 2011-04-10 DIAGNOSIS — D649 Anemia, unspecified: Secondary | ICD-10-CM | POA: Diagnosis not present

## 2011-04-10 MED ORDER — DARBEPOETIN ALFA-POLYSORBATE 100 MCG/0.5ML IJ SOLN
100.0000 ug | Freq: Once | INTRAMUSCULAR | Status: AC
  Administered 2011-04-10: 100 ug via SUBCUTANEOUS

## 2011-04-10 NOTE — Progress Notes (Signed)
[name] [name]'s reason for visit today is for an injection and labs as scheduled per MD orders.  Labs were drawn prior to administration of ordered medication.  Venipuncture performed with a 23 gauge butterfly needle to L Antecubital; today's labs and recent BMET faxed to [facility] office.  [name] [name] also received aranesp per MD orders; see [facility] for administration details.  [name] [name] tolerated all procedures well and without incident; questions were answered and patient was discharged.

## 2011-04-11 ENCOUNTER — Ambulatory Visit: Payer: Medicare Other | Admitting: Adult Health

## 2011-04-14 ENCOUNTER — Encounter (INDEPENDENT_AMBULATORY_CARE_PROVIDER_SITE_OTHER): Payer: Medicare Other

## 2011-04-14 DIAGNOSIS — I10 Essential (primary) hypertension: Secondary | ICD-10-CM | POA: Diagnosis not present

## 2011-04-14 DIAGNOSIS — H35359 Cystoid macular degeneration, unspecified eye: Secondary | ICD-10-CM | POA: Diagnosis not present

## 2011-04-14 DIAGNOSIS — I701 Atherosclerosis of renal artery: Secondary | ICD-10-CM

## 2011-04-14 DIAGNOSIS — H35059 Retinal neovascularization, unspecified, unspecified eye: Secondary | ICD-10-CM | POA: Diagnosis not present

## 2011-04-14 DIAGNOSIS — H353290 Exudative age-related macular degeneration, unspecified eye, stage unspecified: Secondary | ICD-10-CM | POA: Diagnosis not present

## 2011-04-18 ENCOUNTER — Ambulatory Visit: Payer: Medicare Other | Admitting: Cardiology

## 2011-04-18 ENCOUNTER — Encounter: Payer: Self-pay | Admitting: Cardiology

## 2011-04-18 ENCOUNTER — Ambulatory Visit (INDEPENDENT_AMBULATORY_CARE_PROVIDER_SITE_OTHER): Payer: Medicare Other | Admitting: Cardiology

## 2011-04-18 VITALS — BP 141/66 | HR 61 | Resp 16 | Wt 102.0 lb

## 2011-04-18 DIAGNOSIS — I10 Essential (primary) hypertension: Secondary | ICD-10-CM | POA: Diagnosis not present

## 2011-04-18 DIAGNOSIS — I359 Nonrheumatic aortic valve disorder, unspecified: Secondary | ICD-10-CM | POA: Diagnosis not present

## 2011-04-18 NOTE — Patient Instructions (Signed)
Your physician recommends that you schedule a follow-up appointment in: 4 months with Dr. Wall  

## 2011-04-18 NOTE — Assessment & Plan Note (Signed)
Blood pressure is controlled today on current medications. I have reviewed renal ultrasound. The study showed normal renal arteries are widely patent, Renal cysts are noted.  Echocardiogram demonstrated :  Left ventricle: The cavity size was normal. Wall thickness was increased in a pattern of mild LVH. Systolic function was normal. The estimated ejection fraction was in the range of 55% to 60%. Wall motion was normal; there were no regional wall motion abnormalities. Doppler parameters are consistent with abnormal left ventricular relaxation (grade 1 diastolic dysfunction). - Aortic valve: Poorly visualized. Mildly to moderately calcified annulus. Possibly functionally bicuspid; mildly calcified leaflets. There was moderate stenosis. Mild regurgitation. Mean gradient: 13mm Hg (S). VTI ratio of LVOT to aortic valve: 0.45. Valve area: 1.15cm^2(VTI). Valve area: 1.16cm^2 (Vmax). - Mitral valve: Calcified annulus. Mildly thickened leaflets . Trivial regurgitation. - Left atrium: The atrium was at the upper limits of normal in size. - Tricuspid valve: Mild regurgitation. - Pericardium, extracardiac: There was no pericardial effusion.  She is tolerating the medications without side effects.  I do not plan to make any changes in her regimen.  She does not need any further testing. Labs were within normal limits.  Will have her follow with Dr. Daleen Squibb in 6 months.

## 2011-04-18 NOTE — Assessment & Plan Note (Signed)
See above note. She is not a surgical candidate.

## 2011-04-18 NOTE — Progress Notes (Signed)
HPI:[name] [name] is a delightful 78 y/o patient of Dr. Daleen Squibb we are seeing for ongoing assessment and treatment of AoV stenosis, hypertension,with history of carotid stenosis, and renal insufficiency.  On last visit I checked a renal artery ultrasound, echocardiogram and labs.She has been doing well and without complaints.  Allergies  Allergen Reactions  . Macrobid   . Penicillins     Current Outpatient Prescriptions  Medication Sig Dispense Refill  . acetaminophen (TYLENOL) 500 MG tablet Take 500 mg by mouth every 6 (six) hours as needed.        Marland Kitchen amLODipine (NORVASC) 5 MG tablet Take 10 mg by mouth daily.      Marland Kitchen aspirin 81 MG tablet Take 81 mg by mouth daily.        . Bevacizumab (AVASTIN IV) Inject as directed.      Marland Kitchen BIOTIN PO Take by mouth daily.      . calcium-vitamin D (OSCAL WITH D) 500-200 MG-UNIT per tablet Take 1 tablet by mouth daily.        . darbepoetin alfa-polysorbate (ARANESP, ALBUMIN FREE,) 100 MCG/ML injection Inject 1 mL (100 mcg total) into the skin once.  0.4 mL  0  . docusate sodium (COLACE) 100 MG capsule Take 100 mg by mouth 2 (two) times daily.        . hydrALAZINE (APRESOLINE) 10 MG tablet Take 10 mg by mouth as directed.      . hydrochlorothiazide (MICROZIDE) 12.5 MG capsule Take 12.5 mg by mouth daily.      Marland Kitchen levothyroxine (SYNTHROID, LEVOTHROID) 50 MCG tablet Take 50 mcg by mouth daily.        . metoprolol succinate (TOPROL-XL) 50 MG 24 hr tablet Take 100 mg by mouth daily.      . Multiple Vitamins-Minerals (MULTIVITAMIN WITH MINERALS) tablet Take 1 tablet by mouth daily.        . Multiple Vitamins-Minerals (VISION FORMULA PO) Take 1 tablet by mouth daily.        Marland Kitchen omeprazole (PRILOSEC) 20 MG capsule Take 20 mg by mouth daily.      . polyethylene glycol (MIRALAX / GLYCOLAX) packet Take 17 g by mouth daily.        . sertraline (ZOLOFT) 50 MG tablet Take 50 mg by mouth daily.        . traMADol (ULTRAM) 50 MG tablet Take 50 mg by mouth every 6 (six) hours as  needed.          Past Medical History  Diagnosis Date  . Backache, unspecified   . Dizziness and giddiness   . Thyrotoxicosis without mention of goiter or other cause, without mention of thyrotoxic crisis or storm   . Unspecified disorder resulting from impaired renal function   . Anemia, unspecified   . Unspecified essential hypertension     No past surgical history on file.  GNF:[id number] of systems complete and found to be negative unless listed above PHYSICAL EXAM BP 141/66  Pulse 61  Resp 16  Wt 102 lb (46.267 kg)  General: Well developed, well nourished, in no acute distress Head: Eyes PERRLA, No xanthomas.   Normal cephalic and atramatic  Lungs: Clear bilaterally to auscultation and percussion. Heart: HRRR S1 S2, 2/6 holosystolic murmur.  Pulses are 2+ & equal.            No carotid bruit. No JVD.  No abdominal bruits. No femoral bruits. Abdomen: Bowel sounds are positive, abdomen soft and non-tender without masses or  Hernia's noted. Msk:  Back, kyphosis with slow normal gait. Normal strength and tone for age. Extremities: No clubbing, cyanosis or edema.  DP +1 Neuro: Alert and oriented X 3. Psych:  Good affect, responds appropriately  ASSESSMENT AND PLAN

## 2011-04-21 ENCOUNTER — Encounter: Payer: Self-pay | Admitting: *Deleted

## 2011-05-15 DIAGNOSIS — E039 Hypothyroidism, unspecified: Secondary | ICD-10-CM | POA: Diagnosis not present

## 2011-05-21 DIAGNOSIS — H353290 Exudative age-related macular degeneration, unspecified eye, stage unspecified: Secondary | ICD-10-CM | POA: Diagnosis not present

## 2011-05-21 DIAGNOSIS — H35059 Retinal neovascularization, unspecified, unspecified eye: Secondary | ICD-10-CM | POA: Diagnosis not present

## 2011-05-29 ENCOUNTER — Other Ambulatory Visit: Payer: Self-pay | Admitting: Dermatology

## 2011-05-29 DIAGNOSIS — L821 Other seborrheic keratosis: Secondary | ICD-10-CM | POA: Diagnosis not present

## 2011-05-29 DIAGNOSIS — L988 Other specified disorders of the skin and subcutaneous tissue: Secondary | ICD-10-CM | POA: Diagnosis not present

## 2011-05-29 DIAGNOSIS — D239 Other benign neoplasm of skin, unspecified: Secondary | ICD-10-CM | POA: Diagnosis not present

## 2011-05-29 DIAGNOSIS — D485 Neoplasm of uncertain behavior of skin: Secondary | ICD-10-CM | POA: Diagnosis not present

## 2011-05-29 DIAGNOSIS — Z85828 Personal history of other malignant neoplasm of skin: Secondary | ICD-10-CM | POA: Diagnosis not present

## 2011-06-04 DIAGNOSIS — L989 Disorder of the skin and subcutaneous tissue, unspecified: Secondary | ICD-10-CM | POA: Diagnosis not present

## 2011-06-04 DIAGNOSIS — Z4802 Encounter for removal of sutures: Secondary | ICD-10-CM | POA: Diagnosis not present

## 2011-06-05 ENCOUNTER — Encounter (HOSPITAL_COMMUNITY): Payer: Medicare Other | Attending: Nephrology

## 2011-06-05 DIAGNOSIS — D649 Anemia, unspecified: Secondary | ICD-10-CM | POA: Diagnosis not present

## 2011-06-05 LAB — HEMOGLOBIN AND HEMATOCRIT, BLOOD: HCT: 30.4 % — ABNORMAL LOW (ref 36.0–46.0)

## 2011-06-05 MED ORDER — DARBEPOETIN ALFA-POLYSORBATE 100 MCG/0.5ML IJ SOLN
100.0000 ug | Freq: Once | INTRAMUSCULAR | Status: AC
  Administered 2011-06-05: 100 ug via SUBCUTANEOUS

## 2011-06-05 NOTE — Progress Notes (Signed)
[name] [name]'s reason for visit today is for an injection and labs as scheduled per MD orders.  Labs were drawn prior to administration of ordered medication.  Venipuncture performed with a 23 gauge butterfly needle to L Antecubital.  [name] [name] also received Aranesp per MD orders; see [facility] for administration details.  [name] [name] tolerated all procedures well and without incident; questions were answered and patient was discharged.  Lab Results  Component Value Date   HGB 10.0* 06/05/2011

## 2011-06-10 DIAGNOSIS — I10 Essential (primary) hypertension: Secondary | ICD-10-CM | POA: Diagnosis not present

## 2011-06-10 DIAGNOSIS — D649 Anemia, unspecified: Secondary | ICD-10-CM | POA: Diagnosis not present

## 2011-06-24 DIAGNOSIS — H409 Unspecified glaucoma: Secondary | ICD-10-CM | POA: Diagnosis not present

## 2011-06-24 DIAGNOSIS — H04129 Dry eye syndrome of unspecified lacrimal gland: Secondary | ICD-10-CM | POA: Diagnosis not present

## 2011-06-24 DIAGNOSIS — H401490 Capsular glaucoma with pseudoexfoliation of lens, unspecified eye, stage unspecified: Secondary | ICD-10-CM | POA: Diagnosis not present

## 2011-06-24 DIAGNOSIS — H4020X0 Unspecified primary angle-closure glaucoma, stage unspecified: Secondary | ICD-10-CM | POA: Diagnosis not present

## 2011-06-26 DIAGNOSIS — H35059 Retinal neovascularization, unspecified, unspecified eye: Secondary | ICD-10-CM | POA: Diagnosis not present

## 2011-06-26 DIAGNOSIS — H353290 Exudative age-related macular degeneration, unspecified eye, stage unspecified: Secondary | ICD-10-CM | POA: Diagnosis not present

## 2011-06-26 DIAGNOSIS — H35359 Cystoid macular degeneration, unspecified eye: Secondary | ICD-10-CM | POA: Diagnosis not present

## 2011-07-03 DIAGNOSIS — H353290 Exudative age-related macular degeneration, unspecified eye, stage unspecified: Secondary | ICD-10-CM | POA: Diagnosis not present

## 2011-07-03 DIAGNOSIS — H35359 Cystoid macular degeneration, unspecified eye: Secondary | ICD-10-CM | POA: Diagnosis not present

## 2011-07-25 DIAGNOSIS — B079 Viral wart, unspecified: Secondary | ICD-10-CM | POA: Diagnosis not present

## 2011-07-25 DIAGNOSIS — D485 Neoplasm of uncertain behavior of skin: Secondary | ICD-10-CM | POA: Diagnosis not present

## 2011-07-29 DIAGNOSIS — H353290 Exudative age-related macular degeneration, unspecified eye, stage unspecified: Secondary | ICD-10-CM | POA: Diagnosis not present

## 2011-07-29 DIAGNOSIS — H35059 Retinal neovascularization, unspecified, unspecified eye: Secondary | ICD-10-CM | POA: Diagnosis not present

## 2011-08-05 ENCOUNTER — Encounter (HOSPITAL_COMMUNITY): Payer: Medicare Other

## 2011-08-05 ENCOUNTER — Encounter (HOSPITAL_COMMUNITY): Payer: Medicare Other | Attending: Nephrology

## 2011-08-05 VITALS — BP 130/60 | HR 62

## 2011-08-05 DIAGNOSIS — D649 Anemia, unspecified: Secondary | ICD-10-CM

## 2011-08-05 DIAGNOSIS — N259 Disorder resulting from impaired renal tubular function, unspecified: Secondary | ICD-10-CM

## 2011-08-05 LAB — RENAL FUNCTION PANEL
Albumin: 3.5 g/dL (ref 3.5–5.2)
CO2: 31 mEq/L (ref 19–32)
Chloride: 100 mEq/L (ref 96–112)
GFR calc Af Amer: 26 mL/min — ABNORMAL LOW (ref >90)
GFR calc non Af Amer: 23 mL/min — ABNORMAL LOW (ref >90)
Potassium: 3.9 mEq/L (ref 3.5–5.1)

## 2011-08-05 LAB — HEMOGLOBIN AND HEMATOCRIT, BLOOD: HCT: 29.7 % — ABNORMAL LOW (ref 36.0–46.0)

## 2011-08-05 MED ORDER — DARBEPOETIN ALFA-POLYSORBATE 100 MCG/0.5ML IJ SOLN
100.0000 ug | Freq: Once | INTRAMUSCULAR | Status: AC
  Administered 2011-08-05: 100 ug via SUBCUTANEOUS

## 2011-08-05 NOTE — Progress Notes (Signed)
[name] [name] presents today for injection per MD orders. Aranesp 100 mcg administered SQ in left Upper Arm. Administration without incident. Patient tolerated well.

## 2011-08-05 NOTE — Progress Notes (Signed)
Labs drawn today for renal,h&h,phos,pth intact,vit d25

## 2011-08-06 LAB — PTH, INTACT AND CALCIUM
Calcium, Total (PTH): 9.6 mg/dL (ref 8.4–10.5)
PTH: 31.4 pg/mL (ref 14.0–72.0)

## 2011-08-07 DIAGNOSIS — H353290 Exudative age-related macular degeneration, unspecified eye, stage unspecified: Secondary | ICD-10-CM | POA: Diagnosis not present

## 2011-08-07 DIAGNOSIS — H35059 Retinal neovascularization, unspecified, unspecified eye: Secondary | ICD-10-CM | POA: Diagnosis not present

## 2011-08-22 ENCOUNTER — Ambulatory Visit: Payer: Medicare Other | Admitting: Cardiology

## 2011-09-09 ENCOUNTER — Encounter: Payer: Self-pay | Admitting: Cardiology

## 2011-09-09 ENCOUNTER — Ambulatory Visit (INDEPENDENT_AMBULATORY_CARE_PROVIDER_SITE_OTHER): Payer: Medicare Other | Admitting: Adult Health

## 2011-09-09 VITALS — BP 142/76 | HR 64 | Wt 107.0 lb

## 2011-09-09 DIAGNOSIS — I359 Nonrheumatic aortic valve disorder, unspecified: Secondary | ICD-10-CM

## 2011-09-09 DIAGNOSIS — I10 Essential (primary) hypertension: Secondary | ICD-10-CM

## 2011-09-09 DIAGNOSIS — N259 Disorder resulting from impaired renal tubular function, unspecified: Secondary | ICD-10-CM | POA: Diagnosis not present

## 2011-09-09 DIAGNOSIS — H353290 Exudative age-related macular degeneration, unspecified eye, stage unspecified: Secondary | ICD-10-CM | POA: Diagnosis not present

## 2011-09-09 MED ORDER — OMEPRAZOLE 40 MG PO CPDR: 40.0000 mg | DELAYED_RELEASE_CAPSULE | Freq: Every day | ORAL | Status: DC

## 2011-09-09 NOTE — Assessment & Plan Note (Signed)
I have reviewed labs completed one month ago. Continue to monitor closely.

## 2011-09-09 NOTE — Assessment & Plan Note (Signed)
She is doing very well for her age and diagnosis. She is asymptomatic. We will make no changes on her medications at this time. She will followup with Dr. Daleen Squibb in one year unless she becomes symptomatic.

## 2011-09-09 NOTE — Patient Instructions (Addendum)
Your physician recommends that you schedule a follow-up appointment in:   Your physician has recommended you make the following change in your medication:  1 - INCREASE Prilosec to 40 mg daily - if you do not notice improvement in your symptoms, contact Dr Karilyn Cota

## 2011-09-09 NOTE — Progress Notes (Signed)
HPI: [name] [name] is a very pleasant 78 year old female patient of Dr. Elijah Birk wall we are following for ongoing assessment and treatment of aortic valve stenosis, hypertension, with a history of carotid stenosis and renal insufficiency. She was last seen  in March of 2013 and was doing well. At that time echocardiogram had been reviewed revealed normal LVEF, grade 1 diastolic dysfunction, aortic valve was poorly visualized the mean gradient was 13 mmHg (systolic) VTI ratio of LVOT to aortic valve 0.45 with a valve area of 1.1 5 cm by VTI, and 1.16 cm square by Vmax. The patient continues to be active, denies any chest pain or dyspnea on exertion. She does have GERD symptoms which are not adequately controlled by Prilosec. She offers no other complaints. Allergies  Allergen Reactions  . Nitrofurantoin Monohyd Macro   . Penicillins     Current Outpatient Prescriptions  Medication Sig Dispense Refill  . acetaminophen (TYLENOL) 500 MG tablet Take 500 mg by mouth every 6 (six) hours as needed.        Marland Kitchen amLODipine (NORVASC) 5 MG tablet Take 10 mg by mouth daily.      Marland Kitchen aspirin 81 MG tablet Take 81 mg by mouth daily.        . Bevacizumab (AVASTIN IV) Inject as directed.      Marland Kitchen BIOTIN PO Take by mouth daily.      . calcium-vitamin D (OSCAL WITH D) 500-200 MG-UNIT per tablet Take 1 tablet by mouth daily.        . darbepoetin alfa-polysorbate (ARANESP, ALBUMIN FREE,) 100 MCG/ML injection Inject 1 mL (100 mcg total) into the skin once.  0.4 mL  0  . docusate sodium (COLACE) 100 MG capsule Take 100 mg by mouth 2 (two) times daily.        . hydrALAZINE (APRESOLINE) 10 MG tablet Take 10 mg by mouth as directed.      . hydrochlorothiazide (MICROZIDE) 12.5 MG capsule Take 12.5 mg by mouth daily.      Marland Kitchen latanoprost (XALATAN) 0.005 % ophthalmic solution       . levothyroxine (SYNTHROID, LEVOTHROID) 50 MCG tablet Take 50 mcg by mouth daily.        . metoprolol succinate (TOPROL-XL) 50 MG 24 hr tablet Take 100 mg by  mouth daily.      . Multiple Vitamins-Minerals (MULTIVITAMIN WITH MINERALS) tablet Take 1 tablet by mouth daily.        . Multiple Vitamins-Minerals (VISION FORMULA PO) Take 1 tablet by mouth daily.        Marland Kitchen omeprazole (PRILOSEC) 40 MG capsule Take 1 capsule (40 mg total) by mouth daily.  30 capsule  12  . polyethylene glycol (MIRALAX / GLYCOLAX) packet Take 17 g by mouth daily.        . sertraline (ZOLOFT) 50 MG tablet Take 50 mg by mouth daily.        . traMADol (ULTRAM) 50 MG tablet Take 50 mg by mouth every 6 (six) hours as needed.        Marland Kitchen ZOSTAVAX 29562 UNT/0.65ML injection       . DISCONTD: omeprazole (PRILOSEC) 20 MG capsule Take 20 mg by mouth daily.        Past Medical History  Diagnosis Date  . Backache, unspecified   . Dizziness and giddiness   . Thyrotoxicosis without mention of goiter or other cause, without mention of thyrotoxic crisis or storm   . Unspecified disorder resulting from impaired renal function   .  Anemia, unspecified   . Unspecified essential hypertension     No past surgical history on file.  ZOX:WRUEAV of systems complete and found to be negative unless listed above  PHYSICAL EXAM BP 142/76  Pulse 64  Wt 107 lb (48.535 kg)  SpO2 93%  General: Well developed, well nourished, in no acute distress Head: Eyes PERRLA, No xanthomas.   Normal cephalic and atramatic  Lungs: Clear bilaterally to auscultation and percussion. Heart: HRRR S1 S2, 2/6 systolic murmur. Pulses are 2+ & equal.            No carotid bruit. No JVD.  No abdominal bruits. No femoral bruits. Abdomen: Bowel sounds are positive, abdomen soft and non-tender without masses or                  Hernia's noted. Msk:  Back normal, normal gait. Normal strength and tone for age. Extremities: No clubbing, cyanosis or edema.  DP +1 Neuro: Alert and oriented X 3. Hard of hearing. Psych:  Good affect, responds appropriately    ASSESSMENT AND PLAN

## 2011-09-09 NOTE — Assessment & Plan Note (Signed)
Blood pressure is well-controlled. No changes in her medication regimen at this time. She has all medications provided by her PCP with refills. She is to call us sooner she becomes symptomatic.

## 2011-09-15 DIAGNOSIS — H353290 Exudative age-related macular degeneration, unspecified eye, stage unspecified: Secondary | ICD-10-CM | POA: Diagnosis not present

## 2011-09-15 DIAGNOSIS — H35059 Retinal neovascularization, unspecified, unspecified eye: Secondary | ICD-10-CM | POA: Diagnosis not present

## 2011-09-24 DIAGNOSIS — J019 Acute sinusitis, unspecified: Secondary | ICD-10-CM | POA: Diagnosis not present

## 2011-09-30 ENCOUNTER — Ambulatory Visit (HOSPITAL_COMMUNITY): Payer: Medicare Other

## 2011-10-02 ENCOUNTER — Encounter (HOSPITAL_COMMUNITY): Payer: Medicare Other | Attending: Nephrology

## 2011-10-02 VITALS — BP 130/66 | HR 64 | Resp 16

## 2011-10-02 DIAGNOSIS — N259 Disorder resulting from impaired renal tubular function, unspecified: Secondary | ICD-10-CM | POA: Diagnosis not present

## 2011-10-02 LAB — RENAL FUNCTION PANEL
Albumin: 3.5 g/dL (ref 3.5–5.2)
Calcium: 10.2 mg/dL (ref 8.4–10.5)
Creatinine, Ser: 1.88 mg/dL — ABNORMAL HIGH (ref 0.50–1.10)
GFR calc non Af Amer: 22 mL/min — ABNORMAL LOW (ref >90)
Phosphorus: 3.8 mg/dL (ref 2.3–4.6)

## 2011-10-02 LAB — HEMOGLOBIN AND HEMATOCRIT, BLOOD
HCT: 30.5 % — ABNORMAL LOW (ref 36.0–46.0)
Hemoglobin: 10.1 g/dL — ABNORMAL LOW (ref 12.0–15.0)

## 2011-10-02 MED ORDER — DARBEPOETIN ALFA-POLYSORBATE 100 MCG/0.5ML IJ SOLN
100.0000 ug | Freq: Once | INTRAMUSCULAR | Status: AC
  Administered 2011-10-02: 100 ug via SUBCUTANEOUS

## 2011-10-02 NOTE — Progress Notes (Signed)
[name] presented for labwork. Labs per MD order drawn via Peripheral Line 23 gauge needle inserted in left antecubital.  Good blood return present. Procedure without incident.  Needle removed intact. Patient tolerated procedure well. [name] [name] presents today for injection per MD orders. Aranesp 100 mcg administered SQ in left Upper Arm. Administration without incident. Patient tolerated well.

## 2011-10-15 DIAGNOSIS — H35059 Retinal neovascularization, unspecified, unspecified eye: Secondary | ICD-10-CM | POA: Diagnosis not present

## 2011-10-15 DIAGNOSIS — H353290 Exudative age-related macular degeneration, unspecified eye, stage unspecified: Secondary | ICD-10-CM | POA: Diagnosis not present

## 2011-10-20 DIAGNOSIS — H35059 Retinal neovascularization, unspecified, unspecified eye: Secondary | ICD-10-CM | POA: Diagnosis not present

## 2011-10-20 DIAGNOSIS — H353290 Exudative age-related macular degeneration, unspecified eye, stage unspecified: Secondary | ICD-10-CM | POA: Diagnosis not present

## 2011-11-13 DIAGNOSIS — D2330 Other benign neoplasm of skin of unspecified part of face: Secondary | ICD-10-CM | POA: Diagnosis not present

## 2011-11-13 DIAGNOSIS — M7150 Other bursitis, not elsewhere classified, unspecified site: Secondary | ICD-10-CM | POA: Diagnosis not present

## 2011-11-13 DIAGNOSIS — R5381 Other malaise: Secondary | ICD-10-CM | POA: Diagnosis not present

## 2011-11-13 DIAGNOSIS — R5383 Other fatigue: Secondary | ICD-10-CM | POA: Diagnosis not present

## 2011-11-13 DIAGNOSIS — Z23 Encounter for immunization: Secondary | ICD-10-CM | POA: Diagnosis not present

## 2011-11-21 ENCOUNTER — Other Ambulatory Visit: Payer: Self-pay | Admitting: Dermatology

## 2011-11-21 DIAGNOSIS — D485 Neoplasm of uncertain behavior of skin: Secondary | ICD-10-CM | POA: Diagnosis not present

## 2011-11-21 DIAGNOSIS — Z85828 Personal history of other malignant neoplasm of skin: Secondary | ICD-10-CM | POA: Diagnosis not present

## 2011-11-21 DIAGNOSIS — L820 Inflamed seborrheic keratosis: Secondary | ICD-10-CM | POA: Diagnosis not present

## 2011-11-25 DIAGNOSIS — H353290 Exudative age-related macular degeneration, unspecified eye, stage unspecified: Secondary | ICD-10-CM | POA: Diagnosis not present

## 2011-11-27 ENCOUNTER — Encounter (HOSPITAL_COMMUNITY): Payer: Medicare Other | Attending: Nephrology

## 2011-11-27 VITALS — BP 103/70

## 2011-11-27 DIAGNOSIS — D649 Anemia, unspecified: Secondary | ICD-10-CM | POA: Insufficient documentation

## 2011-11-27 LAB — RENAL FUNCTION PANEL
Albumin: 3.4 g/dL — ABNORMAL LOW (ref 3.5–5.2)
BUN: 25 mg/dL — ABNORMAL HIGH (ref 6–23)
Chloride: 98 mEq/L (ref 96–112)
GFR calc Af Amer: 26 mL/min — ABNORMAL LOW (ref >90)
Glucose, Bld: 97 mg/dL (ref 70–99)
Phosphorus: 3.6 mg/dL (ref 2.3–4.6)
Potassium: 3.8 mEq/L (ref 3.5–5.1)
Sodium: 137 mEq/L (ref 135–145)

## 2011-11-27 MED ORDER — DARBEPOETIN ALFA-POLYSORBATE 100 MCG/0.5ML IJ SOLN
100.0000 ug | Freq: Once | INTRAMUSCULAR | Status: AC
  Administered 2011-11-27: 100 ug via SUBCUTANEOUS

## 2011-11-27 NOTE — Progress Notes (Signed)
[name] [name] presents today for injection per MD orders. Aranesp administered SQ in left Upper Arm.  Patient refuses to have it placed in her abdomen as recommended.   Administration without incident. Patient tolerated well.

## 2011-12-08 DIAGNOSIS — H353290 Exudative age-related macular degeneration, unspecified eye, stage unspecified: Secondary | ICD-10-CM | POA: Diagnosis not present

## 2011-12-08 DIAGNOSIS — H35359 Cystoid macular degeneration, unspecified eye: Secondary | ICD-10-CM | POA: Diagnosis not present

## 2011-12-08 DIAGNOSIS — H3570 Unspecified separation of retinal layers: Secondary | ICD-10-CM | POA: Diagnosis not present

## 2011-12-24 DIAGNOSIS — D649 Anemia, unspecified: Secondary | ICD-10-CM | POA: Diagnosis not present

## 2011-12-24 DIAGNOSIS — N184 Chronic kidney disease, stage 4 (severe): Secondary | ICD-10-CM | POA: Diagnosis not present

## 2011-12-24 DIAGNOSIS — I10 Essential (primary) hypertension: Secondary | ICD-10-CM | POA: Diagnosis not present

## 2012-01-12 DIAGNOSIS — H35359 Cystoid macular degeneration, unspecified eye: Secondary | ICD-10-CM | POA: Diagnosis not present

## 2012-01-12 DIAGNOSIS — H353290 Exudative age-related macular degeneration, unspecified eye, stage unspecified: Secondary | ICD-10-CM | POA: Diagnosis not present

## 2012-01-12 DIAGNOSIS — H3570 Unspecified separation of retinal layers: Secondary | ICD-10-CM | POA: Diagnosis not present

## 2012-01-27 ENCOUNTER — Encounter (HOSPITAL_COMMUNITY): Payer: Medicare Other | Attending: Nephrology

## 2012-01-27 VITALS — BP 136/63 | HR 64 | Resp 18

## 2012-01-27 DIAGNOSIS — N189 Chronic kidney disease, unspecified: Secondary | ICD-10-CM | POA: Diagnosis not present

## 2012-01-27 DIAGNOSIS — E559 Vitamin D deficiency, unspecified: Secondary | ICD-10-CM | POA: Insufficient documentation

## 2012-01-27 DIAGNOSIS — I10 Essential (primary) hypertension: Secondary | ICD-10-CM | POA: Insufficient documentation

## 2012-01-27 DIAGNOSIS — Z79899 Other long term (current) drug therapy: Secondary | ICD-10-CM | POA: Diagnosis not present

## 2012-01-27 DIAGNOSIS — D649 Anemia, unspecified: Secondary | ICD-10-CM | POA: Diagnosis not present

## 2012-01-27 LAB — RENAL FUNCTION PANEL
Albumin: 3.8 g/dL (ref 3.5–5.2)
Calcium: 10.1 mg/dL (ref 8.4–10.5)
GFR calc Af Amer: 27 mL/min — ABNORMAL LOW (ref >90)
Glucose, Bld: 82 mg/dL (ref 70–99)
Phosphorus: 3.3 mg/dL (ref 2.3–4.6)
Potassium: 3.8 mEq/L (ref 3.5–5.1)
Sodium: 140 mEq/L (ref 135–145)

## 2012-01-27 LAB — HEMOGLOBIN AND HEMATOCRIT, BLOOD: Hemoglobin: 10.3 g/dL — ABNORMAL LOW (ref 12.0–15.0)

## 2012-01-27 MED ORDER — DARBEPOETIN ALFA-POLYSORBATE 100 MCG/0.5ML IJ SOLN
100.0000 ug | Freq: Once | INTRAMUSCULAR | Status: AC
  Administered 2012-01-27: 100 ug via SUBCUTANEOUS

## 2012-01-27 NOTE — Progress Notes (Signed)
[name] [name]'s reason for visit today is for an injection and labs as scheduled per MD orders.  Labs were drawn prior to administration of ordered medication.  Venipuncture performed with a 23 gauge butterfly needle to L Antecubital.  [name] [name] also received Aranesp per MD orders; see [facility] for administration details.  [name] [name] tolerated all procedures well and without incident; questions were answered and patient was discharged.  New order requested from Dr. Susa Griffins office.

## 2012-02-02 DIAGNOSIS — H353290 Exudative age-related macular degeneration, unspecified eye, stage unspecified: Secondary | ICD-10-CM | POA: Diagnosis not present

## 2012-02-02 DIAGNOSIS — H35059 Retinal neovascularization, unspecified, unspecified eye: Secondary | ICD-10-CM | POA: Diagnosis not present

## 2012-02-23 DIAGNOSIS — H35059 Retinal neovascularization, unspecified, unspecified eye: Secondary | ICD-10-CM | POA: Diagnosis not present

## 2012-02-23 DIAGNOSIS — H35359 Cystoid macular degeneration, unspecified eye: Secondary | ICD-10-CM | POA: Diagnosis not present

## 2012-02-23 DIAGNOSIS — H353290 Exudative age-related macular degeneration, unspecified eye, stage unspecified: Secondary | ICD-10-CM | POA: Diagnosis not present

## 2012-03-09 ENCOUNTER — Ambulatory Visit (HOSPITAL_COMMUNITY)
Admission: RE | Admit: 2012-03-09 | Discharge: 2012-03-09 | Disposition: A | Discharge status: Home / Self Care | Payer: Medicare Other | Source: Ambulatory Visit | Attending: *Deleted | Admitting: *Deleted

## 2012-03-09 ENCOUNTER — Other Ambulatory Visit (HOSPITAL_COMMUNITY): Payer: Self-pay | Admitting: *Deleted

## 2012-03-09 DIAGNOSIS — T148XXA Other injury of unspecified body region, initial encounter: Secondary | ICD-10-CM

## 2012-03-09 DIAGNOSIS — M7989 Other specified soft tissue disorders: Secondary | ICD-10-CM | POA: Diagnosis not present

## 2012-03-09 DIAGNOSIS — M25579 Pain in unspecified ankle and joints of unspecified foot: Secondary | ICD-10-CM | POA: Diagnosis not present

## 2012-03-09 DIAGNOSIS — M79609 Pain in unspecified limb: Secondary | ICD-10-CM | POA: Diagnosis not present

## 2012-03-29 ENCOUNTER — Ambulatory Visit (HOSPITAL_COMMUNITY): Payer: Medicare Other

## 2012-03-29 DIAGNOSIS — H353190 Nonexudative age-related macular degeneration, unspecified eye, stage unspecified: Secondary | ICD-10-CM | POA: Diagnosis not present

## 2012-03-29 DIAGNOSIS — H353290 Exudative age-related macular degeneration, unspecified eye, stage unspecified: Secondary | ICD-10-CM | POA: Diagnosis not present

## 2012-03-31 ENCOUNTER — Encounter (HOSPITAL_COMMUNITY): Payer: Medicare Other | Attending: Nephrology

## 2012-03-31 VITALS — BP 148/66 | HR 63

## 2012-03-31 DIAGNOSIS — D649 Anemia, unspecified: Secondary | ICD-10-CM | POA: Insufficient documentation

## 2012-03-31 LAB — RENAL FUNCTION PANEL
Albumin: 3.9 g/dL (ref 3.5–5.2)
BUN: 23 mg/dL (ref 6–23)
Chloride: 99 mEq/L (ref 96–112)
Creatinine, Ser: 1.75 mg/dL — ABNORMAL HIGH (ref 0.50–1.10)
Glucose, Bld: 82 mg/dL (ref 70–99)
Phosphorus: 3.5 mg/dL (ref 2.3–4.6)
Potassium: 3.8 mEq/L (ref 3.5–5.1)

## 2012-03-31 LAB — HEMOGLOBIN AND HEMATOCRIT, BLOOD: Hemoglobin: 10.2 g/dL — ABNORMAL LOW (ref 12.0–15.0)

## 2012-03-31 MED ORDER — DARBEPOETIN ALFA-POLYSORBATE 100 MCG/0.5ML IJ SOLN
100.0000 ug | Freq: Once | INTRAMUSCULAR | Status: AC
  Administered 2012-03-31: 100 ug via SUBCUTANEOUS

## 2012-03-31 NOTE — Progress Notes (Signed)
[name] [name] presents today for injection per MD orders. Aranesp 100 mcg administered SQ in left Upper Arm. Administration without incident. Patient tolerated well. Specimen obtained peripherally from left ac for labs.

## 2012-04-06 DIAGNOSIS — H35059 Retinal neovascularization, unspecified, unspecified eye: Secondary | ICD-10-CM | POA: Diagnosis not present

## 2012-04-06 DIAGNOSIS — H353290 Exudative age-related macular degeneration, unspecified eye, stage unspecified: Secondary | ICD-10-CM | POA: Diagnosis not present

## 2012-04-13 DIAGNOSIS — IMO0002 Reserved for concepts with insufficient information to code with codable children: Secondary | ICD-10-CM | POA: Diagnosis not present

## 2012-04-13 DIAGNOSIS — B009 Herpesviral infection, unspecified: Secondary | ICD-10-CM | POA: Diagnosis not present

## 2012-04-25 ENCOUNTER — Emergency Department (HOSPITAL_COMMUNITY)
Admission: EM | Admit: 2012-04-25 | Discharge: 2012-04-26 | Disposition: A | Discharge status: Home / Self Care | Payer: Medicare Other | Attending: Emergency Medicine | Admitting: Emergency Medicine

## 2012-04-25 ENCOUNTER — Encounter (HOSPITAL_COMMUNITY): Payer: Self-pay | Admitting: Emergency Medicine

## 2012-04-25 DIAGNOSIS — S0993XA Unspecified injury of face, initial encounter: Secondary | ICD-10-CM | POA: Diagnosis not present

## 2012-04-25 DIAGNOSIS — R42 Dizziness and giddiness: Secondary | ICD-10-CM | POA: Insufficient documentation

## 2012-04-25 DIAGNOSIS — S99919A Unspecified injury of unspecified ankle, initial encounter: Secondary | ICD-10-CM | POA: Diagnosis not present

## 2012-04-25 DIAGNOSIS — S129XXA Fracture of neck, unspecified, initial encounter: Secondary | ICD-10-CM | POA: Diagnosis not present

## 2012-04-25 DIAGNOSIS — I10 Essential (primary) hypertension: Secondary | ICD-10-CM | POA: Insufficient documentation

## 2012-04-25 DIAGNOSIS — S79919A Unspecified injury of unspecified hip, initial encounter: Secondary | ICD-10-CM | POA: Diagnosis not present

## 2012-04-25 DIAGNOSIS — S298XXA Other specified injuries of thorax, initial encounter: Secondary | ICD-10-CM | POA: Diagnosis not present

## 2012-04-25 DIAGNOSIS — W1809XA Striking against other object with subsequent fall, initial encounter: Secondary | ICD-10-CM | POA: Insufficient documentation

## 2012-04-25 DIAGNOSIS — W19XXXA Unspecified fall, initial encounter: Secondary | ICD-10-CM

## 2012-04-25 DIAGNOSIS — E0590 Thyrotoxicosis, unspecified without thyrotoxic crisis or storm: Secondary | ICD-10-CM | POA: Diagnosis not present

## 2012-04-25 DIAGNOSIS — Z7982 Long term (current) use of aspirin: Secondary | ICD-10-CM | POA: Insufficient documentation

## 2012-04-25 DIAGNOSIS — Z862 Personal history of diseases of the blood and blood-forming organs and certain disorders involving the immune mechanism: Secondary | ICD-10-CM | POA: Diagnosis not present

## 2012-04-25 DIAGNOSIS — Z79899 Other long term (current) drug therapy: Secondary | ICD-10-CM | POA: Insufficient documentation

## 2012-04-25 DIAGNOSIS — S79929A Unspecified injury of unspecified thigh, initial encounter: Secondary | ICD-10-CM | POA: Diagnosis not present

## 2012-04-25 DIAGNOSIS — S42002A Fracture of unspecified part of left clavicle, initial encounter for closed fracture: Secondary | ICD-10-CM

## 2012-04-25 DIAGNOSIS — Z87448 Personal history of other diseases of urinary system: Secondary | ICD-10-CM | POA: Insufficient documentation

## 2012-04-25 DIAGNOSIS — Y939 Activity, unspecified: Secondary | ICD-10-CM | POA: Insufficient documentation

## 2012-04-25 DIAGNOSIS — S42009A Fracture of unspecified part of unspecified clavicle, initial encounter for closed fracture: Secondary | ICD-10-CM | POA: Diagnosis not present

## 2012-04-25 DIAGNOSIS — Y92009 Unspecified place in unspecified non-institutional (private) residence as the place of occurrence of the external cause: Secondary | ICD-10-CM | POA: Insufficient documentation

## 2012-04-25 DIAGNOSIS — S0990XA Unspecified injury of head, initial encounter: Secondary | ICD-10-CM | POA: Diagnosis not present

## 2012-04-25 HISTORY — DX: Chronic obstructive pulmonary disease, unspecified: J44.9

## 2012-04-25 NOTE — ED Provider Notes (Signed)
History     CSN: [id number]  Arrival date & time 04/25/12  2314   First MD Initiated Contact with Patient 04/25/12 2344      Chief Complaint  Patient presents with  . Fall    (Consider location/radiation/quality/duration/timing/severity/associated sxs/prior treatment) HPI [name] [name] is a 79 y.o. female brought in by ambulance, who presents to the Emergency Department complaining of a fall at home just prior to arrival. She stood up and lost her balance falling to her left side. She has pain in the left shoulder, ribs, hip. Her left ankle was swollen prior to the fall and is worse since the fall. She has a skin tear to the 5th finger right hand volar surface. She hit her head on the arm of the chair. She denies LOC.    PCP Dr. Margo Aye Past Medical History  Diagnosis Date  . Backache, unspecified   . Dizziness and giddiness   . Thyrotoxicosis without mention of goiter or other cause, without mention of thyrotoxic crisis or storm   . Unspecified disorder resulting from impaired renal function   . Anemia, unspecified   . Unspecified essential hypertension     No past surgical history on file.  No family history on file.  History  Substance Use Topics  . Smoking status: Never Smoker   . Smokeless tobacco: Never Used  . Alcohol Use: No    OB History   Grav Para Term Preterm Abortions TAB SAB Ect Mult Living                  Review of Systems  Constitutional: Negative for fever.       10 Systems reviewed and are negative for acute change except as noted in the HPI.  HENT: Negative for congestion.   Eyes: Negative for discharge and redness.  Respiratory: Negative for cough and shortness of breath.   Cardiovascular: Negative for chest pain.  Gastrointestinal: Negative for vomiting and abdominal pain.  Musculoskeletal: Negative for back pain.       Left sided pain  Skin: Negative for rash.  Neurological: Negative for syncope, numbness and headaches.   Psychiatric/Behavioral:       No behavior change.    Allergies  Nitrofurantoin monohyd macro and Penicillins  Home Medications   Current Outpatient Rx  Name  Route  Sig  Dispense  Refill  . acetaminophen (TYLENOL) 500 MG tablet   Oral   Take 500 mg by mouth every 6 (six) hours as needed.           Marland Kitchen amLODipine (NORVASC) 5 MG tablet   Oral   Take 10 mg by mouth daily.         Marland Kitchen aspirin 81 MG tablet   Oral   Take 81 mg by mouth daily.           . Bevacizumab (AVASTIN IV)   Injection   Inject as directed.         Marland Kitchen BIOTIN PO   Oral   Take by mouth daily.         . calcium-vitamin D (OSCAL WITH D) 500-200 MG-UNIT per tablet   Oral   Take 1 tablet by mouth daily.           . darbepoetin alfa-polysorbate (ARANESP, ALBUMIN FREE,) 100 MCG/ML injection   Subcutaneous   Inject 1 mL (100 mcg total) into the skin once.   0.4 mL   0   . docusate sodium (COLACE) 100 MG  capsule   Oral   Take 100 mg by mouth 2 (two) times daily.           . hydrALAZINE (APRESOLINE) 10 MG tablet   Oral   Take 10 mg by mouth as directed.         . hydrochlorothiazide (MICROZIDE) 12.5 MG capsule   Oral   Take 12.5 mg by mouth daily.         Marland Kitchen latanoprost (XALATAN) 0.005 % ophthalmic solution               . levothyroxine (SYNTHROID, LEVOTHROID) 50 MCG tablet   Oral   Take 50 mcg by mouth daily.           . metoprolol succinate (TOPROL-XL) 50 MG 24 hr tablet   Oral   Take 100 mg by mouth daily.         . Multiple Vitamins-Minerals (MULTIVITAMIN WITH MINERALS) tablet   Oral   Take 1 tablet by mouth daily.           . Multiple Vitamins-Minerals (VISION FORMULA PO)   Oral   Take 1 tablet by mouth daily.           Marland Kitchen omeprazole (PRILOSEC) 40 MG capsule   Oral   Take 1 capsule (40 mg total) by mouth daily.   30 capsule   12   . polyethylene glycol (MIRALAX / GLYCOLAX) packet   Oral   Take 17 g by mouth daily.           . sertraline (ZOLOFT) 50  MG tablet   Oral   Take 50 mg by mouth daily.           . traMADol (ULTRAM) 50 MG tablet   Oral   Take 50 mg by mouth every 6 (six) hours as needed.           Marland Kitchen ZOSTAVAX 16109 UNT/0.65ML injection                 BP 195/67  Pulse 60  Temp(Src) 98.5 F (36.9 C) (Oral)  Ht 4' 11.5" (1.511 m)  Wt 107 lb (48.535 kg)  BMI 21.26 kg/m2  SpO2 98%  Physical Exam  Nursing note and vitals reviewed. Constitutional:  Awake, alert, nontoxic appearance. Frail elderly woman  HENT:  Right Ear: External ear normal.  Left Ear: External ear normal.  Mouth/Throat: Oropharynx is clear and moist.  Bruise to the temporoparietal area on the left.   Eyes: EOM are normal. Pupils are equal, round, and reactive to light. Right eye exhibits no discharge. Left eye exhibits no discharge.  Neck: Normal range of motion. Neck supple.  Cardiovascular: Normal rate and normal heart sounds.   Pulmonary/Chest: Effort normal and breath sounds normal. She exhibits no tenderness.  Chest wall tenderness to the left side with palpation. No deformity, no crepitus.  Abdominal: Soft. There is no tenderness. There is no rebound.  Musculoskeletal: She exhibits no tenderness.  Baseline ROM, no obvious new focal weakness.Full range of motion in left hip. Painful to left rib cage when tried to move the leg to 90 degrees. Lateral left foot with bruising and swelling.  Neurological:  Mental status and motor strength appears baseline for patient and situation.  Skin: No rash noted.  Bruise and tenderness to left shoulder, no deformity.  Psychiatric: She has a normal mood and affect.    ED Course  Procedures (including critical care time) Results for orders placed during the hospital encounter  of 04/25/12  CBC WITH DIFFERENTIAL      Result Value Range   WBC 6.4  4.0 - 10.5 K/uL   RBC 3.17 (*) 3.87 - 5.11 MIL/uL   Hemoglobin 9.9 (*) 12.0 - 15.0 g/dL   HCT 16.1 (*) 09.6 - 04.5 %   MCV 95.6  78.0 - 100.0 fL   MCH  31.2  26.0 - 34.0 pg   MCHC 32.7  30.0 - 36.0 g/dL   RDW 40.9  81.1 - 91.4 %   Platelets 223  150 - 400 K/uL   Neutrophils Relative 71  43 - 77 %   Neutro Abs 4.5  1.7 - 7.7 K/uL   Lymphocytes Relative 16  12 - 46 %   Lymphs Abs 1.0  0.7 - 4.0 K/uL   Monocytes Relative 10  3 - 12 %   Monocytes Absolute 0.6  0.1 - 1.0 K/uL   Eosinophils Relative 3  0 - 5 %   Eosinophils Absolute 0.2  0.0 - 0.7 K/uL   Basophils Relative 0  0 - 1 %   Basophils Absolute 0.0  0.0 - 0.1 K/uL  BASIC METABOLIC PANEL      Result Value Range   Sodium 137  135 - 145 mEq/L   Potassium 4.0  3.5 - 5.1 mEq/L   Chloride 97  96 - 112 mEq/L   CO2 31  19 - 32 mEq/L   Glucose, Bld 121 (*) 70 - 99 mg/dL   BUN 24 (*) 6 - 23 mg/dL   Creatinine, Ser 7.82 (*) 0.50 - 1.10 mg/dL   Calcium 95.6  8.4 - 21.3 mg/dL   GFR calc non Af Amer 25 (*) >90 mL/min   GFR calc Af Amer 28 (*) >90 mL/min   Dg Hip Complete Left  04/26/2012  *RADIOLOGY REPORT*  Clinical Data: Fall  LEFT HIP - COMPLETE 2+ VIEW  Comparison: None.  Findings: Bones are osteopenic.  No obvious fracture or dislocation.  Mild narrowing of the hip joints bilaterally.  IMPRESSION: No acute bony pathology.   Original Report Authenticated By: Jolaine Click, M.D.    Ct Head Wo Contrast  04/26/2012  *RADIOLOGY REPORT*  Clinical Data:  Fall.  CT HEAD WITHOUT CONTRAST CT CERVICAL SPINE WITHOUT CONTRAST  Technique:  Multidetector CT imaging of the head and cervical spine was performed following the standard protocol without intravenous contrast.  Multiplanar CT image reconstructions of the cervical spine were also generated.  Comparison:  CT head 08/30/2006  CT HEAD  Findings: Motion artifact limits visualization and some areas. Diffuse cerebral atrophy.  Ventricular dilatation consistent with central atrophy.  Low attenuation changes throughout the deep [place] matter consistent with small vessel ischemia.  No mass effect or midline shift.  No abnormal extra-axial fluid collections.   Wallace Cullens- [place] matter junctions are distinct.  Basal cisterns are not effaced.  No evidence of acute intracranial hemorrhage.  Vascular calcifications.  No depressed skull fractures.  Visualized paranasal sinuses demonstrate mucosal thickening and partial opacification of the sphenoid sinuses bilaterally.  Sclerosis in the sphenoid sinus on the right.  This is stable since the previous study.  The small sphenoid meningioma is not excluded.  Visualized mastoid air cells are not opacified.  IMPRESSION: No acute intracranial abnormalities.  Chronic-appearing atrophy and small vessel ischemic changes.  CT CERVICAL SPINE  Findings: There are diffuse degenerative changes throughout the cervical spine with near complete narrowing of cervical interspaces and endplate hypertrophic changes.  Degenerative disc disease at C5-  6 and C6-7 levels.  Degenerative changes throughout the cervical facet joints causing narrowing of neural foramina bilaterally. Degenerative changes at C1-2.  There is mild anterior subluxation of C3-C4, C4 on C5, and C7-T1 with mild retrolisthesis of C5 on C6 and C6 on C7.  These changes are likely to be degenerative.  There is no definite vertebral compression deformity and no definite prevertebral soft tissue swelling is identified.  Diffuse bone demineralization.  No focal bone lesion or bone destruction is appreciated.  Vascular calcifications in the cervical carotid arteries.  Scarring and emphysematous changes in the upper lungs. Pleural calcification in the apices.  IMPRESSION: Diffuse degenerative changes in the cervical spine.  No displaced fractures are identified.   Original Report Authenticated By: Burman Nieves, M.D.    Ct Chest Wo Contrast  04/26/2012  *RADIOLOGY REPORT*  Clinical Data: Fall.  CT CHEST WITHOUT CONTRAST  Technique:  Multidetector CT imaging of the chest was performed following the standard protocol without IV contrast.  Comparison: 03/11/2004  Findings: Mild cardiac  enlargement.  Calcification in the aortic and mitral valves.  Calcification in the coronary arteries and thoracic aorta.  Tortuous aorta without aneurysm.  No abnormal mediastinal fluid collections.  The esophagus is decompressed.  No significant lymphadenopathy in the chest.  No pleural effusions. There are emphysematous changes and fibrosis throughout the lungs. Pleural calcification and fibrosis in the lung apices.  Calcified granulomas in the lungs.  No focal consolidation or airspace disease.  No pneumothorax.  Visualized portions of the upper abdominal organs demonstrate probable cysts in both kidneys, measuring at least 5.2 cm on the right and 2.2 cm on the left.  Vascular calcifications.  Suggestion of mild dilatation of the pancreatic duct in the body region. These changes were not included on the previous examination for correlation.  Degenerative changes in the thoracic spine with multiple thoracic vertebral compression fractures, including T4, T6, T8, T9, and L1. L1 demonstrates post kyphoplasty changes.  Compression fractures have progressed since the previous CT study with appear relatively stable since the chest radiograph from 11/21/2008.  Old bilateral rib fractures.  IMPRESSION: Chronic changes including vascular calcifications, cardiac valve calcifications, emphysematous changes and fibrosis in the lungs, postinflammatory granulomas and pleural thickening in the apices, and multiple vertebral compression fractures.   Original Report Authenticated By: Burman Nieves, M.D.    Ct Cervical Spine Wo Contrast  04/26/2012  *RADIOLOGY REPORT*  Clinical Data:  Fall.  CT HEAD WITHOUT CONTRAST CT CERVICAL SPINE WITHOUT CONTRAST  Technique:  Multidetector CT imaging of the head and cervical spine was performed following the standard protocol without intravenous contrast.  Multiplanar CT image reconstructions of the cervical spine were also generated.  Comparison:  CT head 08/30/2006  CT HEAD  Findings:  Motion artifact limits visualization and some areas. Diffuse cerebral atrophy.  Ventricular dilatation consistent with central atrophy.  Low attenuation changes throughout the deep [place] matter consistent with small vessel ischemia.  No mass effect or midline shift.  No abnormal extra-axial fluid collections.  Wallace Cullens- Litzenberger matter junctions are distinct.  Basal cisterns are not effaced.  No evidence of acute intracranial hemorrhage.  Vascular calcifications.  No depressed skull fractures.  Visualized paranasal sinuses demonstrate mucosal thickening and partial opacification of the sphenoid sinuses bilaterally.  Sclerosis in the sphenoid sinus on the right.  This is stable since the previous study.  The small sphenoid meningioma is not excluded.  Visualized mastoid air cells are not opacified.  IMPRESSION: No acute intracranial abnormalities.  Chronic-appearing atrophy and small vessel ischemic changes.  CT CERVICAL SPINE  Findings: There are diffuse degenerative changes throughout the cervical spine with near complete narrowing of cervical interspaces and endplate hypertrophic changes.  Degenerative disc disease at C5- 6 and C6-7 levels.  Degenerative changes throughout the cervical facet joints causing narrowing of neural foramina bilaterally. Degenerative changes at C1-2.  There is mild anterior subluxation of C3-C4, C4 on C5, and C7-T1 with mild retrolisthesis of C5 on C6 and C6 on C7.  These changes are likely to be degenerative.  There is no definite vertebral compression deformity and no definite prevertebral soft tissue swelling is identified.  Diffuse bone demineralization.  No focal bone lesion or bone destruction is appreciated.  Vascular calcifications in the cervical carotid arteries.  Scarring and emphysematous changes in the upper lungs. Pleural calcification in the apices.  IMPRESSION: Diffuse degenerative changes in the cervical spine.  No displaced fractures are identified.   Original Report  Authenticated By: Burman Nieves, M.D.    Dg Shoulder Left  04/26/2012  *RADIOLOGY REPORT*  Clinical Data: Fall  LEFT SHOULDER - 2+ VIEW  Comparison: None.  Findings: The glenohumeral joint is anatomic.  Humerus and scapula are intact.  There is a fracture involving the peripheral aspect of the clavicle.  The lateral fragment is proximally 2 cm and minimally displaced.  AC joint is grossly intact.  Chronic changes at the lung apices.  No obvious rib fracture.  IMPRESSION: Acute peripheral clavicle fracture.   Original Report Authenticated By: Jolaine Click, M.D.    Dg Foot Complete Left  04/26/2012  *RADIOLOGY REPORT*  Clinical Data: Fall  LEFT FOOT - COMPLETE 3+ VIEW  Comparison: None.  Findings: Osteopenia.  No acute fracture.  No dislocation.  IMPRESSION: No acute bony pathology.   Original Report Authenticated By: Jolaine Click, M.D.     MDM  Patient who fell at home hitting her left side and head on the floor. She has sustained a clavicle fracture. She has rib pain on the left with breathing but no new fractures. She has been given morphine and zofran while here. Reviewed the results with patient and her daughter.Pt stable in ED with no significant deterioration in condition.The patient appears reasonably screened and/or stabilized for discharge and I doubt any other medical condition or other [facility] requiring further screening, evaluation, or treatment in the ED at this time prior to discharge.  MDM Reviewed: nursing note and vitals Interpretation: labs, CT scan and x-ray          Nicoletta Dress. Colon Branch, MD 04/26/12 [phone]

## 2012-04-26 ENCOUNTER — Emergency Department (HOSPITAL_COMMUNITY): Payer: Medicare Other

## 2012-04-26 DIAGNOSIS — S298XXA Other specified injuries of thorax, initial encounter: Secondary | ICD-10-CM | POA: Diagnosis not present

## 2012-04-26 DIAGNOSIS — S199XXA Unspecified injury of neck, initial encounter: Secondary | ICD-10-CM | POA: Diagnosis not present

## 2012-04-26 DIAGNOSIS — S0990XA Unspecified injury of head, initial encounter: Secondary | ICD-10-CM | POA: Diagnosis not present

## 2012-04-26 DIAGNOSIS — S42009A Fracture of unspecified part of unspecified clavicle, initial encounter for closed fracture: Secondary | ICD-10-CM | POA: Diagnosis not present

## 2012-04-26 DIAGNOSIS — S8990XA Unspecified injury of unspecified lower leg, initial encounter: Secondary | ICD-10-CM | POA: Diagnosis not present

## 2012-04-26 DIAGNOSIS — S79919A Unspecified injury of unspecified hip, initial encounter: Secondary | ICD-10-CM | POA: Diagnosis not present

## 2012-04-26 LAB — CBC WITH DIFFERENTIAL/PLATELET
Basophils Absolute: 0 10*3/uL (ref 0.0–0.1)
HCT: 30.3 % — ABNORMAL LOW (ref 36.0–46.0)
Hemoglobin: 9.9 g/dL — ABNORMAL LOW (ref 12.0–15.0)
Lymphocytes Relative: 16 % (ref 12–46)
Monocytes Absolute: 0.6 10*3/uL (ref 0.1–1.0)
Monocytes Relative: 10 % (ref 3–12)
Neutro Abs: 4.5 10*3/uL (ref 1.7–7.7)
WBC: 6.4 10*3/uL (ref 4.0–10.5)

## 2012-04-26 LAB — BASIC METABOLIC PANEL
CO2: 31 mEq/L (ref 19–32)
Chloride: 97 mEq/L (ref 96–112)
Creatinine, Ser: 1.69 mg/dL — ABNORMAL HIGH (ref 0.50–1.10)

## 2012-04-26 MED ORDER — SODIUM CHLORIDE 0.9 % IV SOLN
Freq: Once | INTRAVENOUS | Status: AC
  Administered 2012-04-26: 75 mL/h via INTRAVENOUS

## 2012-04-26 MED ORDER — HYDROCODONE-ACETAMINOPHEN 5-325 MG PO TABS: ORAL_TABLET | ORAL | Status: DC

## 2012-04-26 MED ORDER — MORPHINE SULFATE 4 MG/ML IJ SOLN
1.0000 mg | Freq: Once | INTRAMUSCULAR | Status: AC
  Administered 2012-04-26: 1 mg via INTRAVENOUS
  Filled 2012-04-26: qty 1

## 2012-04-26 MED ORDER — ONDANSETRON 4 MG PO TBDP: 4.0000 mg | ORAL_TABLET | Freq: Three times a day (TID) | ORAL | Status: DC | PRN

## 2012-04-26 MED ORDER — ONDANSETRON HCL 4 MG/2ML IJ SOLN
4.0000 mg | Freq: Once | INTRAMUSCULAR | Status: AC
  Administered 2012-04-26: 4 mg via INTRAVENOUS
  Filled 2012-04-26: qty 2

## 2012-04-26 NOTE — ED Notes (Signed)
Fell at home.  Lost balance when she stood up, fell onto left side - pain in left rib area /left shoulder. Skin tear to right 5th finger volar aspect (was holding a magnifying glass when she feel and it cut her hand)   Swelling of left ankle prior to fall and daughter states worse now. Also pain with swelling to left parietal/occipital area of head. Incontinent of urine when she arrived to ED.  Wet clothing removed.

## 2012-04-26 NOTE — ED Notes (Signed)
Right 5th finger very discolored and swelling noted, however has good range of motion with c/o pain  Up to bedside commode to void prior to getting dressed.  Daughter assisting.  Patient moving well even though painful when she moves

## 2012-04-29 DIAGNOSIS — S42023A Displaced fracture of shaft of unspecified clavicle, initial encounter for closed fracture: Secondary | ICD-10-CM | POA: Diagnosis not present

## 2012-04-29 DIAGNOSIS — Q766 Other congenital malformations of ribs: Secondary | ICD-10-CM | POA: Diagnosis not present

## 2012-05-13 DIAGNOSIS — R350 Frequency of micturition: Secondary | ICD-10-CM | POA: Diagnosis not present

## 2012-05-13 DIAGNOSIS — E039 Hypothyroidism, unspecified: Secondary | ICD-10-CM | POA: Diagnosis not present

## 2012-05-13 DIAGNOSIS — R7301 Impaired fasting glucose: Secondary | ICD-10-CM | POA: Diagnosis not present

## 2012-05-25 ENCOUNTER — Encounter (HOSPITAL_COMMUNITY): Payer: Medicare Other | Attending: Nephrology

## 2012-05-25 VITALS — BP 140/61 | HR 58

## 2012-05-25 DIAGNOSIS — N259 Disorder resulting from impaired renal tubular function, unspecified: Secondary | ICD-10-CM | POA: Insufficient documentation

## 2012-05-25 MED ORDER — DARBEPOETIN ALFA-POLYSORBATE 100 MCG/0.5ML IJ SOLN
100.0000 ug | Freq: Once | INTRAMUSCULAR | Status: AC
  Administered 2012-05-25: 100 ug via SUBCUTANEOUS

## 2012-05-25 NOTE — Progress Notes (Signed)
Aranesp 100 mcg given sub-q to upper left arm as requested by pt.  Tolerated well.

## 2012-05-26 ENCOUNTER — Ambulatory Visit (HOSPITAL_COMMUNITY): Payer: Medicare Other

## 2012-05-26 DIAGNOSIS — H35359 Cystoid macular degeneration, unspecified eye: Secondary | ICD-10-CM | POA: Diagnosis not present

## 2012-05-26 DIAGNOSIS — H353290 Exudative age-related macular degeneration, unspecified eye, stage unspecified: Secondary | ICD-10-CM | POA: Diagnosis not present

## 2012-05-26 DIAGNOSIS — H35059 Retinal neovascularization, unspecified, unspecified eye: Secondary | ICD-10-CM | POA: Diagnosis not present

## 2012-06-01 DIAGNOSIS — H353290 Exudative age-related macular degeneration, unspecified eye, stage unspecified: Secondary | ICD-10-CM | POA: Diagnosis not present

## 2012-06-01 DIAGNOSIS — H35059 Retinal neovascularization, unspecified, unspecified eye: Secondary | ICD-10-CM | POA: Diagnosis not present

## 2012-06-30 ENCOUNTER — Other Ambulatory Visit: Payer: Self-pay | Admitting: Dermatology

## 2012-06-30 DIAGNOSIS — L821 Other seborrheic keratosis: Secondary | ICD-10-CM | POA: Diagnosis not present

## 2012-06-30 DIAGNOSIS — I781 Nevus, non-neoplastic: Secondary | ICD-10-CM | POA: Diagnosis not present

## 2012-06-30 DIAGNOSIS — Z85828 Personal history of other malignant neoplasm of skin: Secondary | ICD-10-CM | POA: Diagnosis not present

## 2012-06-30 DIAGNOSIS — C4441 Basal cell carcinoma of skin of scalp and neck: Secondary | ICD-10-CM | POA: Diagnosis not present

## 2012-06-30 DIAGNOSIS — L570 Actinic keratosis: Secondary | ICD-10-CM | POA: Diagnosis not present

## 2012-06-30 DIAGNOSIS — L820 Inflamed seborrheic keratosis: Secondary | ICD-10-CM | POA: Diagnosis not present

## 2012-06-30 DIAGNOSIS — L259 Unspecified contact dermatitis, unspecified cause: Secondary | ICD-10-CM | POA: Diagnosis not present

## 2012-06-30 DIAGNOSIS — D485 Neoplasm of uncertain behavior of skin: Secondary | ICD-10-CM | POA: Diagnosis not present

## 2012-07-06 DIAGNOSIS — E039 Hypothyroidism, unspecified: Secondary | ICD-10-CM | POA: Diagnosis not present

## 2012-07-06 DIAGNOSIS — D649 Anemia, unspecified: Secondary | ICD-10-CM | POA: Diagnosis not present

## 2012-07-06 DIAGNOSIS — I10 Essential (primary) hypertension: Secondary | ICD-10-CM | POA: Diagnosis not present

## 2012-07-20 ENCOUNTER — Ambulatory Visit (HOSPITAL_COMMUNITY)
Admission: RE | Admit: 2012-07-20 | Discharge: 2012-07-20 | Disposition: A | Discharge status: Home / Self Care | Payer: Medicare Other | Source: Ambulatory Visit | Attending: Nephrology | Admitting: Nephrology

## 2012-07-20 ENCOUNTER — Ambulatory Visit (HOSPITAL_COMMUNITY): Payer: Medicare Other

## 2012-07-20 DIAGNOSIS — D649 Anemia, unspecified: Secondary | ICD-10-CM | POA: Diagnosis not present

## 2012-07-20 DIAGNOSIS — N289 Disorder of kidney and ureter, unspecified: Secondary | ICD-10-CM | POA: Insufficient documentation

## 2012-07-20 LAB — RENAL FUNCTION PANEL
CO2: 31 mEq/L (ref 19–32)
Calcium: 9.8 mg/dL (ref 8.4–10.5)
Chloride: 99 mEq/L (ref 96–112)
GFR calc Af Amer: 31 mL/min — ABNORMAL LOW (ref >90)
Glucose, Bld: 68 mg/dL — ABNORMAL LOW (ref 70–99)
Sodium: 140 mEq/L (ref 135–145)

## 2012-07-20 LAB — HEMOGLOBIN AND HEMATOCRIT, BLOOD
HCT: 31.4 % — ABNORMAL LOW (ref 36.0–46.0)
Hemoglobin: 10.3 g/dL — ABNORMAL LOW (ref 12.0–15.0)

## 2012-07-20 MED ORDER — DARBEPOETIN ALFA-POLYSORBATE 100 MCG/0.5ML IJ SOLN
100.0000 ug | INTRAMUSCULAR | Status: AC
  Administered 2012-07-20: 100 ug via SUBCUTANEOUS
  Filled 2012-07-20: qty 0.5

## 2012-07-20 NOTE — Progress Notes (Addendum)
Pt arrived for sq injection of aranesp. Diagnosis of chronic anemia and renal insufficiency.  Blood drawn for H&H, renal panel and sent to lab. H&H results 10.3.  Aranesp 100 mcg given sq left deltoid site.

## 2012-07-20 NOTE — Progress Notes (Signed)
Value Flag Range & Units Status   Hemoglobin 10.3 (L) 12.0 - 15.0 g/dL Final   HCT 29.5 (L) 62.1 - 46.0 % Final

## 2012-07-21 DIAGNOSIS — H35059 Retinal neovascularization, unspecified, unspecified eye: Secondary | ICD-10-CM | POA: Diagnosis not present

## 2012-07-21 DIAGNOSIS — H353290 Exudative age-related macular degeneration, unspecified eye, stage unspecified: Secondary | ICD-10-CM | POA: Diagnosis not present

## 2012-07-27 DIAGNOSIS — H353290 Exudative age-related macular degeneration, unspecified eye, stage unspecified: Secondary | ICD-10-CM | POA: Diagnosis not present

## 2012-07-27 DIAGNOSIS — H35059 Retinal neovascularization, unspecified, unspecified eye: Secondary | ICD-10-CM | POA: Diagnosis not present

## 2012-08-04 DIAGNOSIS — L821 Other seborrheic keratosis: Secondary | ICD-10-CM | POA: Diagnosis not present

## 2012-08-04 DIAGNOSIS — C4441 Basal cell carcinoma of skin of scalp and neck: Secondary | ICD-10-CM | POA: Diagnosis not present

## 2012-08-04 DIAGNOSIS — L570 Actinic keratosis: Secondary | ICD-10-CM | POA: Diagnosis not present

## 2012-08-04 DIAGNOSIS — B079 Viral wart, unspecified: Secondary | ICD-10-CM | POA: Diagnosis not present

## 2012-09-01 DIAGNOSIS — H353290 Exudative age-related macular degeneration, unspecified eye, stage unspecified: Secondary | ICD-10-CM | POA: Diagnosis not present

## 2012-09-01 DIAGNOSIS — H35059 Retinal neovascularization, unspecified, unspecified eye: Secondary | ICD-10-CM | POA: Diagnosis not present

## 2012-09-13 ENCOUNTER — Encounter (HOSPITAL_COMMUNITY)
Admission: RE | Admit: 2012-09-13 | Discharge: 2012-09-13 | Disposition: A | Discharge status: Home / Self Care | Payer: Medicare Other | Source: Ambulatory Visit | Attending: Nephrology | Admitting: Nephrology

## 2012-09-13 DIAGNOSIS — D649 Anemia, unspecified: Secondary | ICD-10-CM | POA: Diagnosis not present

## 2012-09-13 LAB — RENAL FUNCTION PANEL
Albumin: 3.6 g/dL (ref 3.5–5.2)
BUN: 26 mg/dL — ABNORMAL HIGH (ref 6–23)
CO2: 32 meq/L (ref 19–32)
Calcium: 10 mg/dL (ref 8.4–10.5)
Chloride: 100 meq/L (ref 96–112)
Creatinine, Ser: 1.75 mg/dL — ABNORMAL HIGH (ref 0.50–1.10)
GFR calc Af Amer: 27 mL/min — ABNORMAL LOW
GFR calc non Af Amer: 24 mL/min — ABNORMAL LOW
Glucose, Bld: 88 mg/dL (ref 70–99)
Phosphorus: 3.1 mg/dL (ref 2.3–4.6)
Potassium: 3.9 meq/L (ref 3.5–5.1)
Sodium: 140 meq/L (ref 135–145)

## 2012-09-13 LAB — HEMOGLOBIN AND HEMATOCRIT, BLOOD: Hemoglobin: 9.8 g/dL — ABNORMAL LOW (ref 12.0–15.0)

## 2012-09-13 MED ORDER — DARBEPOETIN ALFA-POLYSORBATE 100 MCG/0.5ML IJ SOLN
100.0000 ug | Freq: Once | INTRAMUSCULAR | Status: AC
  Administered 2012-09-13: 100 ug via SUBCUTANEOUS
  Filled 2012-09-13: qty 0.5

## 2012-09-13 NOTE — Progress Notes (Signed)
Ms. Browder arrived to [facility] with family. Pt ambulated with my assistance to clinic area. Blood work drawn and aranesp SQ given per Dr. Susa Griffins order for chronic anemia. Blood work result sent to treatment team via EPIC. Pt tolerated procedure well.

## 2012-09-13 NOTE — Progress Notes (Signed)
Results for [name] [name], [name] [name] (MRN [id number]) as of 09/13/2012 12:54  Ref. Range 09/13/2012 11:18  Sodium Latest Range: 135-145 mEq/L 140  Potassium Latest Range: 3.5-5.1 mEq/L 3.9  Chloride Latest Range: 96-112 mEq/L 100  CO2 Latest Range: 19-32 mEq/L 32  BUN Latest Range: 6-23 mg/dL 26 (H)  Creatinine Latest Range: 0.50-1.10 mg/dL 4.09 (H)  Calcium Latest Range: 8.4-10.5 mg/dL 81.1  GFR calc non Af Amer Latest Range: >90 mL/min 24 (L)  GFR calc Af Amer Latest Range: >90 mL/min 27 (L)  Glucose Latest Range: 70-99 mg/dL 88  Phosphorus Latest Range: 2.3-4.6 mg/dL 3.1  Albumin Latest Range: 3.5-5.2 g/dL 3.6  Hemoglobin Latest Range: 12.0-15.0 g/dL 9.8 (L)  HCT Latest Range: 36.0-46.0 % 30.3 (L)

## 2012-09-13 NOTE — Progress Notes (Signed)
Results for JOELEEN, WORTLEY (MRN [id number]) as of 09/13/2012 11:49  Ref. Range 09/13/2012 11:18  Hemoglobin Latest Range: 12.0-15.0 g/dL 9.8 (L)  HCT Latest Range: 36.0-46.0 % 30.3 (L)   Aranesp 100 mcg given per MD order.

## 2012-09-22 DIAGNOSIS — H35059 Retinal neovascularization, unspecified, unspecified eye: Secondary | ICD-10-CM | POA: Diagnosis not present

## 2012-09-22 DIAGNOSIS — H353290 Exudative age-related macular degeneration, unspecified eye, stage unspecified: Secondary | ICD-10-CM | POA: Diagnosis not present

## 2012-09-22 DIAGNOSIS — H35359 Cystoid macular degeneration, unspecified eye: Secondary | ICD-10-CM | POA: Diagnosis not present

## 2012-10-13 ENCOUNTER — Other Ambulatory Visit: Payer: Self-pay | Admitting: Adult Health

## 2012-10-13 DIAGNOSIS — H35059 Retinal neovascularization, unspecified, unspecified eye: Secondary | ICD-10-CM | POA: Diagnosis not present

## 2012-10-13 DIAGNOSIS — H353290 Exudative age-related macular degeneration, unspecified eye, stage unspecified: Secondary | ICD-10-CM | POA: Diagnosis not present

## 2012-11-08 ENCOUNTER — Encounter (HOSPITAL_COMMUNITY)
Admission: RE | Admit: 2012-11-08 | Discharge: 2012-11-08 | Disposition: A | Discharge status: Home / Self Care | Payer: Medicare Other | Source: Ambulatory Visit | Attending: Nephrology | Admitting: Nephrology

## 2012-11-08 DIAGNOSIS — N189 Chronic kidney disease, unspecified: Secondary | ICD-10-CM | POA: Insufficient documentation

## 2012-11-08 DIAGNOSIS — Z23 Encounter for immunization: Secondary | ICD-10-CM | POA: Diagnosis not present

## 2012-11-08 DIAGNOSIS — D649 Anemia, unspecified: Secondary | ICD-10-CM | POA: Diagnosis not present

## 2012-11-08 LAB — RENAL FUNCTION PANEL
BUN: 23 mg/dL (ref 6–23)
CO2: 33 mEq/L — ABNORMAL HIGH (ref 19–32)
Calcium: 10.4 mg/dL (ref 8.4–10.5)
Chloride: 98 mEq/L (ref 96–112)
Creatinine, Ser: 1.71 mg/dL — ABNORMAL HIGH (ref 0.50–1.10)
Glucose, Bld: 89 mg/dL (ref 70–99)

## 2012-11-08 MED ORDER — DARBEPOETIN ALFA-POLYSORBATE 100 MCG/0.5ML IJ SOLN
100.0000 ug | INTRAMUSCULAR | Status: DC
Start: 2012-11-08 — End: 2012-11-09
  Administered 2012-11-08: 100 ug via SUBCUTANEOUS
  Filled 2012-11-08: qty 1.2

## 2012-11-08 NOTE — Progress Notes (Signed)
Results for [name] [name], [name] [name] (MRN [id number]) as of 11/08/2012 12:39  Faxed to Dr. Kristian Covey & Dr. Catalina Pizza  Ref. Range 11/08/2012 11:04  Sodium Latest Range: 135-145 mEq/L 139  Potassium Latest Range: 3.5-5.1 mEq/L 4.1  Chloride Latest Range: 96-112 mEq/L 98  CO2 Latest Range: 19-32 mEq/L 33 (H)  BUN Latest Range: 6-23 mg/dL 23  Creatinine Latest Range: 0.50-1.10 mg/dL 4.09 (H)  Calcium Latest Range: 8.4-10.5 mg/dL 81.1  GFR calc non Af Amer Latest Range: >90 mL/min 24 (L)  GFR calc Af Amer Latest Range: >90 mL/min 28 (L)  Glucose Latest Range: 70-99 mg/dL 89  Phosphorus Latest Range: 2.3-4.6 mg/dL 3.8  Albumin Latest Range: 3.5-5.2 g/dL 3.7  Hemoglobin Latest Range: 12.0-15.0 g/dL 91.4 (L)  HCT Latest Range: 36.0-46.0 % 31.9 (L)

## 2012-11-17 DIAGNOSIS — H35059 Retinal neovascularization, unspecified, unspecified eye: Secondary | ICD-10-CM | POA: Diagnosis not present

## 2012-11-17 DIAGNOSIS — H353290 Exudative age-related macular degeneration, unspecified eye, stage unspecified: Secondary | ICD-10-CM | POA: Diagnosis not present

## 2012-11-24 DIAGNOSIS — H35059 Retinal neovascularization, unspecified, unspecified eye: Secondary | ICD-10-CM | POA: Diagnosis not present

## 2012-11-24 DIAGNOSIS — H353290 Exudative age-related macular degeneration, unspecified eye, stage unspecified: Secondary | ICD-10-CM | POA: Diagnosis not present

## 2012-11-30 ENCOUNTER — Ambulatory Visit: Payer: Medicare Other | Admitting: Cardiology

## 2012-12-01 ENCOUNTER — Encounter: Payer: Self-pay | Admitting: Cardiology

## 2012-12-01 ENCOUNTER — Ambulatory Visit (INDEPENDENT_AMBULATORY_CARE_PROVIDER_SITE_OTHER): Payer: Medicare Other | Admitting: Cardiology

## 2012-12-01 VITALS — BP 150/73 | HR 70 | Ht 60.0 in | Wt 102.0 lb

## 2012-12-01 DIAGNOSIS — I10 Essential (primary) hypertension: Secondary | ICD-10-CM

## 2012-12-01 DIAGNOSIS — I658 Occlusion and stenosis of other precerebral arteries: Secondary | ICD-10-CM

## 2012-12-01 DIAGNOSIS — I6529 Occlusion and stenosis of unspecified carotid artery: Secondary | ICD-10-CM

## 2012-12-01 DIAGNOSIS — I359 Nonrheumatic aortic valve disorder, unspecified: Secondary | ICD-10-CM | POA: Diagnosis not present

## 2012-12-01 DIAGNOSIS — I6523 Occlusion and stenosis of bilateral carotid arteries: Secondary | ICD-10-CM

## 2012-12-01 DIAGNOSIS — I350 Nonrheumatic aortic (valve) stenosis: Secondary | ICD-10-CM

## 2012-12-01 NOTE — Progress Notes (Addendum)
Clinical Summary [name] [name] is a 79 y.o.female former patient of Dr Daleen Squibb, presents today for follow up of the following medical conditions.   1. Aortic stenosis - last echo showed moderate stenosis - denies any new SOB, stable DOE at 1 flights of stairs. No chest pain. Has some dizziness, usually with standing up first thing in the morning.   2. HTN - checks bp at home occasionally, typically 140s-170s/70s - compliant with meds   3. Carotid stenosis - mild disease on most recent ultrasound - denies any neurological symptoms  Past Medical History  Diagnosis Date  . Backache, unspecified   . Dizziness and giddiness   . Thyrotoxicosis without mention of goiter or other cause, without mention of thyrotoxic crisis or storm   . Unspecified disorder resulting from impaired renal function   . Anemia, unspecified   . Unspecified essential hypertension   . COPD (chronic obstructive pulmonary disease)      Allergies  Allergen Reactions  . Neosporin [Neomycin-Bacitracin Zn-Polymyx] Rash  . Nitrofurantoin Monohyd Macro   . Penicillins      Current Outpatient Prescriptions  Medication Sig Dispense Refill  . acetaminophen (TYLENOL) 500 MG tablet Take 500 mg by mouth every 6 (six) hours as needed.        Marland Kitchen amLODipine (NORVASC) 5 MG tablet Take by mouth daily.       Marland Kitchen aspirin 81 MG tablet Take 81 mg by mouth daily.        . Bevacizumab (AVASTIN IV) Inject as directed.      Marland Kitchen BIOTIN PO Take by mouth daily.      . calcium-vitamin D (OSCAL WITH D) 500-200 MG-UNIT per tablet Take 1 tablet by mouth daily.        . darbepoetin alfa-polysorbate (ARANESP, ALBUMIN FREE,) 100 MCG/ML injection Inject 1 mL (100 mcg total) into the skin once.  0.4 mL  0  . docusate sodium (COLACE) 100 MG capsule Take 100 mg by mouth 2 (two) times daily.        Marland Kitchen gabapentin (NEURONTIN) 100 MG capsule Take 100 mg by mouth 1 day or 1 dose.      . hydrALAZINE (APRESOLINE) 10 MG tablet Take 10 mg by mouth as  directed.      . hydrochlorothiazide (MICROZIDE) 12.5 MG capsule Take 12.5 mg by mouth daily.      Marland Kitchen HYDROcodone-acetaminophen (NORCO/VICODIN) 5-325 MG per tablet Take 1/2 tablet every 4 hours as needed for pain  15 tablet  0  . latanoprost (XALATAN) 0.005 % ophthalmic solution       . levothyroxine (SYNTHROID, LEVOTHROID) 50 MCG tablet Take 50 mcg by mouth daily.        . metoprolol succinate (TOPROL-XL) 50 MG 24 hr tablet Take 100 mg by mouth daily.      . Multiple Vitamins-Minerals (MULTIVITAMIN WITH MINERALS) tablet Take 1 tablet by mouth daily.        . Multiple Vitamins-Minerals (VISION FORMULA PO) Take 1 tablet by mouth daily.        Marland Kitchen omeprazole (PRILOSEC) 20 MG capsule TAKE (2) CAPSULES BY MOUTH ONCE DAILY.  60 capsule  3  . omeprazole (PRILOSEC) 40 MG capsule Take 1 capsule (40 mg total) by mouth daily.  30 capsule  12  . ondansetron (ZOFRAN ODT) 4 MG disintegrating tablet Take 1 tablet (4 mg total) by mouth every 8 (eight) hours as needed for nausea.  20 tablet  0  . polyethylene glycol (MIRALAX / GLYCOLAX)  packet Take 17 g by mouth daily.        . sertraline (ZOLOFT) 50 MG tablet Take 100 mg by mouth daily.       . traMADol (ULTRAM) 50 MG tablet Take 50 mg by mouth every 6 (six) hours as needed.        Marland Kitchen ZOSTAVAX 21308 UNT/0.65ML injection        No current facility-administered medications for this visit.     Past Surgical History  Procedure Laterality Date  . Small bowel obst    . Salivary gland surgery Left   . Back surgery      Kyphoplasty     Allergies  Allergen Reactions  . Neosporin [Neomycin-Bacitracin Zn-Polymyx] Rash  . Nitrofurantoin Monohyd Macro   . Penicillins       No family history on file.   Social History [name] [name] reports that she has never smoked. She has never used smokeless tobacco. [name] [name] reports that she does not drink alcohol.   Review of Systems CONSTITUTIONAL: No weight loss, fever, chills, weakness or fatigue.  HEENT: Eyes: No  visual loss, blurred vision, double vision or yellow sclerae.No hearing loss, sneezing, congestion, runny nose or sore throat.  SKIN: No rash or itching.  CARDIOVASCULAR: per HPI RESPIRATORY: per HPI GASTROINTESTINAL: No anorexia, nausea, vomiting or diarrhea. No abdominal pain or blood.  GENITOURINARY: No burning on urination, no polyuria NEUROLOGICAL: per HPI  MUSCULOSKELETAL: No muscle, back pain, joint pain or stiffness.  LYMPHATICS: No enlarged nodes. No history of splenectomy.  PSYCHIATRIC: No history of depression or anxiety.  ENDOCRINOLOGIC: No reports of sweating, cold or heat intolerance. No polyuria or polydipsia.  Marland Kitchen   Physical Examination p 70 bp 150/73 Wt 102 lbs BMI 20 Gen: resting comfortably, no acute distress HEENT: no scleral icterus, pupils equal round and reactive, no palptable cervical adenopathy,  CV: RRR, 3/6 mid peaking systolic murmur RUSB radiates to right carotid, no JVD Resp: Clear to auscultation bilaterally GI: abdomen is soft, non-tender, non-distended, normal bowel sounds, no hepatosplenomegaly MSK: extremities are warm, no edema.  Skin: warm, no rash Neuro:  no focal deficits Psych: appropriate affect   Diagnostic Studies 03/2011 Echo: LVEF 55-60%, mild LVH, no WMAs, grade I diastolic dysfunction, moderate AS (mead grad 13, AVA 1.15 by VTI, dimensionless index 0.45).   03/2011 Renal Artery Duplex: difficult study, normal renal arteries  03/2011 Carotid US:  1. Bilateral carotid bifurcation and proximal ICA plaque, resulting in less than 50% diameter stenosis. Little convincing progression since previous exam. The exam does not exclude plaque ulceration or embolization. Continued surveillance recommended.  12/01/12 EKG: sinus rhythm, normal axis, normal interval, LVH  Assessment and Plan  1. Aortic stenosis - moderate by echo last year (AVA 1.1, mean grad 13). Patient with no symptoms specific to her stenotic valve - discussed with patient and  her daughter continuing surveillance ultrasounds, and criteria for intervention. At this point we agree that patient would be too high risk for any form of valve intervention given her overall age and fraility.  - will not continue [place] surveillance of this valve as the patient would not want intervention at any time  2. HTN - describes some orthostatic symptoms at home - will decrease her Toprol XL - blood pressure is reasonable for bp management in an elderly patient - asked to bring  bp log next visit  3. Carotid stenosis - mild didsease on last [place] in 03/2011, no symptoms - similar to her valve, patient  would be at increased risk for any form of prevention, which she and her family do not want - will not repeat carotid US      Antoine Poche, M.D., F.A.C.C.

## 2012-12-01 NOTE — Patient Instructions (Addendum)
Your physician recommends that you schedule a follow-up appointment in: 3 MONTHS  Your physician has recommended you make the following change in your medication:   1) DECREASE TOPROL XL TO 50MG  ONCE DAILY

## 2013-01-03 ENCOUNTER — Encounter (HOSPITAL_COMMUNITY)
Admission: RE | Admit: 2013-01-03 | Discharge: 2013-01-03 | Disposition: A | Discharge status: Home / Self Care | Payer: Medicare Other | Source: Ambulatory Visit | Attending: Nephrology | Admitting: Nephrology

## 2013-01-03 DIAGNOSIS — N189 Chronic kidney disease, unspecified: Secondary | ICD-10-CM | POA: Insufficient documentation

## 2013-01-03 DIAGNOSIS — D649 Anemia, unspecified: Secondary | ICD-10-CM | POA: Diagnosis not present

## 2013-01-03 LAB — RENAL FUNCTION PANEL
CO2: 32 mEq/L (ref 19–32)
GFR calc Af Amer: 28 mL/min — ABNORMAL LOW (ref >90)
GFR calc non Af Amer: 25 mL/min — ABNORMAL LOW (ref >90)
Glucose, Bld: 79 mg/dL (ref 70–99)
Potassium: 3.9 mEq/L (ref 3.5–5.1)
Sodium: 140 mEq/L (ref 135–145)

## 2013-01-03 LAB — HEMOGLOBIN AND HEMATOCRIT, BLOOD: HCT: 31.6 % — ABNORMAL LOW (ref 36.0–46.0)

## 2013-01-03 NOTE — Progress Notes (Signed)
Results for [name] [name], [name] [name] (MRN [id number]) as of 01/03/2013 15:08  Ref. Range 01/03/2013 11:30  Glucose Latest Range: 70-99 mg/dL 79  Results for [name] [name], [name] [name] (MRN [id number]) as of 01/03/2013 15:08  Ref. Range 01/03/2013 11:30  Sodium Latest Range: 135-145 mEq/L 140  Potassium Latest Range: 3.5-5.1 mEq/L 3.9  Chloride Latest Range: 96-112 mEq/L 98  CO2 Latest Range: 19-32 mEq/L 32  BUN Latest Range: 6-23 mg/dL 22  Creatinine Latest Range: 0.50-1.10 mg/dL 0.86 (H)  Calcium Latest Range: 8.4-10.5 mg/dL 57.8  GFR calc non Af Amer Latest Range: >90 mL/min 25 (L)  GFR calc Af Amer Latest Range: >90 mL/min 28 (L)  Glucose Latest Range: 70-99 mg/dL 79  Phosphorus Latest Range: 2.3-4.6 mg/dL 3.0  Albumin Latest Range: 3.5-5.2 g/dL 3.5  Results for [name] [name], [name] [name] (MRN [id number]) as of 01/03/2013 15:08  Ref. Range 01/03/2013 11:29  Hemoglobin Latest Range: 12.0-15.0 g/dL 41.3 (L)  HCT Latest Range: 36.0-46.0 % 31.6 (L)  Aranesp held today

## 2013-01-05 DIAGNOSIS — H3570 Unspecified separation of retinal layers: Secondary | ICD-10-CM | POA: Diagnosis not present

## 2013-01-05 DIAGNOSIS — H353290 Exudative age-related macular degeneration, unspecified eye, stage unspecified: Secondary | ICD-10-CM | POA: Diagnosis not present

## 2013-01-05 DIAGNOSIS — H35359 Cystoid macular degeneration, unspecified eye: Secondary | ICD-10-CM | POA: Diagnosis not present

## 2013-01-05 DIAGNOSIS — H353190 Nonexudative age-related macular degeneration, unspecified eye, stage unspecified: Secondary | ICD-10-CM | POA: Diagnosis not present

## 2013-01-12 DIAGNOSIS — H353290 Exudative age-related macular degeneration, unspecified eye, stage unspecified: Secondary | ICD-10-CM | POA: Diagnosis not present

## 2013-01-12 DIAGNOSIS — H35059 Retinal neovascularization, unspecified, unspecified eye: Secondary | ICD-10-CM | POA: Diagnosis not present

## 2013-01-17 DIAGNOSIS — N189 Chronic kidney disease, unspecified: Secondary | ICD-10-CM | POA: Diagnosis not present

## 2013-01-17 DIAGNOSIS — D649 Anemia, unspecified: Secondary | ICD-10-CM | POA: Diagnosis not present

## 2013-02-10 DIAGNOSIS — Z79899 Other long term (current) drug therapy: Secondary | ICD-10-CM | POA: Diagnosis not present

## 2013-02-10 DIAGNOSIS — N183 Chronic kidney disease, stage 3 unspecified: Secondary | ICD-10-CM | POA: Diagnosis not present

## 2013-02-10 DIAGNOSIS — E559 Vitamin D deficiency, unspecified: Secondary | ICD-10-CM | POA: Diagnosis not present

## 2013-02-10 DIAGNOSIS — D649 Anemia, unspecified: Secondary | ICD-10-CM | POA: Diagnosis not present

## 2013-02-11 ENCOUNTER — Encounter (HOSPITAL_COMMUNITY)
Admission: RE | Admit: 2013-02-11 | Discharge: 2013-02-11 | Disposition: A | Discharge status: Home / Self Care | Payer: Medicare Other | Source: Ambulatory Visit | Attending: Nephrology | Admitting: Nephrology

## 2013-02-11 DIAGNOSIS — N189 Chronic kidney disease, unspecified: Secondary | ICD-10-CM | POA: Diagnosis not present

## 2013-02-11 DIAGNOSIS — D649 Anemia, unspecified: Secondary | ICD-10-CM | POA: Insufficient documentation

## 2013-02-11 MED ORDER — DARBEPOETIN ALFA-POLYSORBATE 100 MCG/0.5ML IJ SOLN
100.0000 ug | INTRAMUSCULAR | Status: DC
  Administered 2013-02-11: 100 ug via SUBCUTANEOUS
  Filled 2013-02-11: qty 1.2

## 2013-02-11 MED ORDER — DARBEPOETIN ALFA-POLYSORBATE 100 MCG/0.5ML IJ SOLN
INTRAMUSCULAR | Status: AC
  Filled 2013-02-11: qty 0.5

## 2013-02-23 DIAGNOSIS — H35059 Retinal neovascularization, unspecified, unspecified eye: Secondary | ICD-10-CM | POA: Diagnosis not present

## 2013-02-23 DIAGNOSIS — H353290 Exudative age-related macular degeneration, unspecified eye, stage unspecified: Secondary | ICD-10-CM | POA: Diagnosis not present

## 2013-02-28 ENCOUNTER — Ambulatory Visit (HOSPITAL_COMMUNITY): Payer: Medicare Other

## 2013-02-28 ENCOUNTER — Other Ambulatory Visit (HOSPITAL_COMMUNITY): Payer: Medicare Other

## 2013-02-28 DIAGNOSIS — R809 Proteinuria, unspecified: Secondary | ICD-10-CM | POA: Diagnosis not present

## 2013-02-28 DIAGNOSIS — N189 Chronic kidney disease, unspecified: Secondary | ICD-10-CM | POA: Diagnosis not present

## 2013-02-28 DIAGNOSIS — D649 Anemia, unspecified: Secondary | ICD-10-CM | POA: Diagnosis not present

## 2013-03-02 DIAGNOSIS — H353290 Exudative age-related macular degeneration, unspecified eye, stage unspecified: Secondary | ICD-10-CM | POA: Diagnosis not present

## 2013-03-02 DIAGNOSIS — H35059 Retinal neovascularization, unspecified, unspecified eye: Secondary | ICD-10-CM | POA: Diagnosis not present

## 2013-03-08 ENCOUNTER — Ambulatory Visit (INDEPENDENT_AMBULATORY_CARE_PROVIDER_SITE_OTHER): Payer: Medicare Other | Admitting: Cardiology

## 2013-03-08 ENCOUNTER — Encounter: Payer: Self-pay | Admitting: Cardiology

## 2013-03-08 VITALS — BP 163/69 | HR 65 | Ht 60.0 in | Wt 104.0 lb

## 2013-03-08 DIAGNOSIS — I359 Nonrheumatic aortic valve disorder, unspecified: Secondary | ICD-10-CM | POA: Diagnosis not present

## 2013-03-08 DIAGNOSIS — I10 Essential (primary) hypertension: Secondary | ICD-10-CM | POA: Diagnosis not present

## 2013-03-08 DIAGNOSIS — E039 Hypothyroidism, unspecified: Secondary | ICD-10-CM | POA: Diagnosis not present

## 2013-03-08 DIAGNOSIS — R42 Dizziness and giddiness: Secondary | ICD-10-CM | POA: Diagnosis not present

## 2013-03-08 DIAGNOSIS — D649 Anemia, unspecified: Secondary | ICD-10-CM | POA: Diagnosis not present

## 2013-03-08 DIAGNOSIS — N183 Chronic kidney disease, stage 3 unspecified: Secondary | ICD-10-CM | POA: Diagnosis not present

## 2013-03-08 DIAGNOSIS — I350 Nonrheumatic aortic (valve) stenosis: Secondary | ICD-10-CM

## 2013-03-08 NOTE — Progress Notes (Signed)
Clinical Summary [name] [name] is a 80 y.o.female seen today for follow up of the following medical problems.  1. Aortic stenosis  - last echo showed moderate stenosis in 2013 - family reports incresed DOE over the last 6 months. No chest pain, no syncope  2. HTN  - checks bp at home occasionally, typically 150-160s/60s  - compliant with meds   3. Carotid stenosis  - mild disease on most recent ultrasound  - denies any neurological symptoms  4. Dizziness - orthostatic symptoms improved after decreasing metoprolol last visit  Past Medical History  Diagnosis Date  . Backache, unspecified   . Dizziness and giddiness   . Thyrotoxicosis without mention of goiter or other cause, without mention of thyrotoxic crisis or storm   . Unspecified disorder resulting from impaired renal function   . Anemia, unspecified   . Unspecified essential hypertension   . COPD (chronic obstructive pulmonary disease)      Allergies  Allergen Reactions  . Neosporin [Neomycin-Bacitracin Zn-Polymyx] Rash  . Nitrofurantoin Monohyd Macro   . Penicillins      Current Outpatient Prescriptions  Medication Sig Dispense Refill  . acetaminophen (TYLENOL) 500 MG tablet Take 500 mg by mouth every 6 (six) hours as needed.        Marland Kitchen amLODipine (NORVASC) 5 MG tablet Take by mouth daily.       Marland Kitchen aspirin 81 MG tablet Take 81 mg by mouth daily.        . Bevacizumab (AVASTIN IV) Inject as directed.      Marland Kitchen BIOTIN PO Take by mouth daily.      . calcium-vitamin D (OSCAL WITH D) 500-200 MG-UNIT per tablet Take 1 tablet by mouth daily.        . darbepoetin alfa-polysorbate (ARANESP, ALBUMIN FREE,) 100 MCG/ML injection Inject 1 mL (100 mcg total) into the skin once.  0.4 mL  0  . docusate sodium (COLACE) 100 MG capsule Take 100 mg by mouth 2 (two) times daily.        Marland Kitchen gabapentin (NEURONTIN) 100 MG capsule Take 100 mg by mouth 1 day or 1 dose.      . hydrALAZINE (APRESOLINE) 10 MG tablet Take 10 mg by mouth as  directed.      . hydrochlorothiazide (MICROZIDE) 12.5 MG capsule Take 12.5 mg by mouth daily.      Marland Kitchen HYDROcodone-acetaminophen (NORCO/VICODIN) 5-325 MG per tablet Take 1/2 tablet every 4 hours as needed for pain  15 tablet  0  . latanoprost (XALATAN) 0.005 % ophthalmic solution       . levothyroxine (SYNTHROID, LEVOTHROID) 50 MCG tablet Take 50 mcg by mouth daily.        . metoprolol succinate (TOPROL-XL) 50 MG 24 hr tablet Take 50 mg by mouth daily.       . Multiple Vitamins-Minerals (MULTIVITAMIN WITH MINERALS) tablet Take 1 tablet by mouth daily.        . Multiple Vitamins-Minerals (VISION FORMULA PO) Take 1 tablet by mouth daily.        Marland Kitchen omeprazole (PRILOSEC) 20 MG capsule TAKE (2) CAPSULES BY MOUTH ONCE DAILY.  60 capsule  3  . omeprazole (PRILOSEC) 40 MG capsule Take 1 capsule (40 mg total) by mouth daily.  30 capsule  12  . ondansetron (ZOFRAN ODT) 4 MG disintegrating tablet Take 1 tablet (4 mg total) by mouth every 8 (eight) hours as needed for nausea.  20 tablet  0  . polyethylene glycol (MIRALAX /  GLYCOLAX) packet Take 17 g by mouth daily.        . sertraline (ZOLOFT) 50 MG tablet Take 100 mg by mouth daily.       . traMADol (ULTRAM) 50 MG tablet Take 50 mg by mouth every 6 (six) hours as needed.        Marland Kitchen ZOSTAVAX [id number] UNT/0.65ML injection        No current facility-administered medications for this visit.     Past Surgical History  Procedure Laterality Date  . Small bowel obst    . Salivary gland surgery Left   . Back surgery      Kyphoplasty     Allergies  Allergen Reactions  . Neosporin [Neomycin-Bacitracin Zn-Polymyx] Rash  . Nitrofurantoin Monohyd Macro   . Penicillins       No family history on file.   Social History [name] [name] reports that she has never smoked. She has never used smokeless tobacco. [name] [name] reports that she does not drink alcohol.   Review of Systems CONSTITUTIONAL: No weight loss, fever, chills, weakness or fatigue.  HEENT: Eyes: No  visual loss, blurred vision, double vision or yellow sclerae.No hearing loss, sneezing, congestion, runny nose or sore throat.  SKIN: No rash or itching.  CARDIOVASCULAR: per HPI RESPIRATORY: No cough or sputum.  GASTROINTESTINAL: No anorexia, nausea, vomiting or diarrhea. No abdominal pain or blood.  GENITOURINARY: No burning on urination, no polyuria NEUROLOGICAL: No headache, dizziness, syncope, paralysis, ataxia, numbness or tingling in the extremities. No change in bowel or bladder control.  MUSCULOSKELETAL: No muscle, back pain, joint pain or stiffness.  LYMPHATICS: No enlarged nodes. No history of splenectomy.  PSYCHIATRIC: No history of depression or anxiety.  ENDOCRINOLOGIC: No reports of sweating, cold or heat intolerance. No polyuria or polydipsia.  Marland Kitchen   Physical Examination p 65 bp 163/69 Wt 104 lbs BMI 20 Gen: resting comfortably, no acute distress HEENT: no scleral icterus, pupils equal round and reactive, no palptable cervical adenopathy,  CV: RRR, 3/6 systolic murmur RUSB, no JVD, no carotid bruits Resp: Clear to auscultation bilaterally GI: abdomen is soft, non-tender, non-distended, normal bowel sounds, no hepatosplenomegaly MSK: extremities are warm, no edema.  Skin: warm, no rash Neuro:  no focal deficits Psych: appropriate affect   Diagnostic Studies 03/2011 Echo: LVEF 55-60%, mild LVH, no WMAs, grade I diastolic dysfunction, moderate AS (mead grad 13, AVA 1.15 by VTI, dimensionless index 0.45).   03/2011 Renal Artery Duplex: difficult study, normal renal arteries   03/2011 Carotid US:  1. Bilateral carotid bifurcation and proximal ICA plaque, resulting in less than 50% diameter stenosis. Little convincing progression since previous exam. The exam does not exclude plaque ulceration or embolization. Continued surveillance recommended.  12/01/12 EKG: sinus rhythm, normal axis, normal interval, LVH        Assessment and Plan  1. Aortic stenosis  - moderate  by echo in 2013 (AVA 1.1, mean grad 13). - family notes increased DOE over the last 6 months, last visit we had discussed not repeating further echoes as we would not likely pursue any form of intervention given her age and comorbidities. Given worsening symptoms family would like to get echo, will order.    2. HTN  - reasonable control given her age and orthostatic symptoms, risk of orthostatic dizziness and falls greater than SBPs in the 150s-160s for this elderly patient in her mid to late 63s.  - continue current meds  3. Carotid stenosis  - mild didsease on last [place]  in 03/2011, no symptoms  - continue to follow clinically, would not pursue prophylactic intervention given her age and overall risk.   4. Dizziness - improved since decreasing Toprol - continue to follow  Follow up 4 months     Arnoldo Lenis, M.D., F.A.C.C.

## 2013-03-08 NOTE — Patient Instructions (Signed)
Your physician recommends that you schedule a follow-up appointment in: 4 months  Your physician has requested that you have an echocardiogram. Echocardiography is a painless test that uses sound waves to create images of your heart. It provides your doctor with information about the size and shape of your heart and how well your heart's chambers and valves are working. This procedure takes approximately one hour. There are no restrictions for this procedure.  WE WILL CALL YOU WITH YOUR TEST RESULTS/INSTRUCTIONS/NEXT STEPS ONCE RECEIVED BY THE PROVIDER   Your physician has recommended you make the following change in your medication:   1) STOP TAKING HYDRALAZINE

## 2013-03-11 ENCOUNTER — Other Ambulatory Visit (HOSPITAL_COMMUNITY): Payer: Medicare Other

## 2013-03-28 ENCOUNTER — Ambulatory Visit (HOSPITAL_COMMUNITY)
Admission: RE | Admit: 2013-03-28 | Discharge: 2013-03-28 | Disposition: A | Discharge status: Home / Self Care | Payer: Medicare Other | Source: Ambulatory Visit | Attending: Cardiology | Admitting: Cardiology

## 2013-03-28 DIAGNOSIS — R0609 Other forms of dyspnea: Secondary | ICD-10-CM | POA: Diagnosis not present

## 2013-03-28 DIAGNOSIS — I359 Nonrheumatic aortic valve disorder, unspecified: Secondary | ICD-10-CM | POA: Insufficient documentation

## 2013-03-28 DIAGNOSIS — R42 Dizziness and giddiness: Secondary | ICD-10-CM | POA: Diagnosis not present

## 2013-03-28 DIAGNOSIS — I10 Essential (primary) hypertension: Secondary | ICD-10-CM | POA: Diagnosis not present

## 2013-03-28 DIAGNOSIS — R0989 Other specified symptoms and signs involving the circulatory and respiratory systems: Principal | ICD-10-CM | POA: Insufficient documentation

## 2013-03-28 DIAGNOSIS — J449 Chronic obstructive pulmonary disease, unspecified: Secondary | ICD-10-CM | POA: Diagnosis not present

## 2013-03-28 DIAGNOSIS — I350 Nonrheumatic aortic (valve) stenosis: Secondary | ICD-10-CM

## 2013-03-28 DIAGNOSIS — J4489 Other specified chronic obstructive pulmonary disease: Secondary | ICD-10-CM | POA: Insufficient documentation

## 2013-03-28 NOTE — Progress Notes (Signed)
*  PRELIMINARY RESULTS* Echocardiogram 2D Echocardiogram has been performed.  [place], [place] 03/28/2013, 2:04 PM

## 2013-03-30 DIAGNOSIS — H35059 Retinal neovascularization, unspecified, unspecified eye: Secondary | ICD-10-CM | POA: Diagnosis not present

## 2013-03-30 DIAGNOSIS — H353290 Exudative age-related macular degeneration, unspecified eye, stage unspecified: Secondary | ICD-10-CM | POA: Diagnosis not present

## 2013-04-08 ENCOUNTER — Encounter (HOSPITAL_COMMUNITY)
Admission: RE | Admit: 2013-04-08 | Discharge: 2013-04-08 | Disposition: A | Discharge status: Home / Self Care | Payer: Medicare Other | Source: Ambulatory Visit | Attending: Nephrology | Admitting: Nephrology

## 2013-04-08 DIAGNOSIS — D631 Anemia in chronic kidney disease: Secondary | ICD-10-CM | POA: Insufficient documentation

## 2013-04-08 DIAGNOSIS — N183 Chronic kidney disease, stage 3 unspecified: Secondary | ICD-10-CM | POA: Diagnosis not present

## 2013-04-08 DIAGNOSIS — N039 Chronic nephritic syndrome with unspecified morphologic changes: Secondary | ICD-10-CM

## 2013-04-08 LAB — HEMOGLOBIN AND HEMATOCRIT, BLOOD
HEMATOCRIT: 29.6 % — AB (ref 36.0–46.0)
HEMOGLOBIN: 9.8 g/dL — AB (ref 12.0–15.0)

## 2013-04-08 MED ORDER — DARBEPOETIN ALFA-POLYSORBATE 100 MCG/0.5ML IJ SOLN
INTRAMUSCULAR | Status: AC
Start: 2013-04-08 — End: 2013-04-08
  Filled 2013-04-08: qty 0.5

## 2013-04-08 MED ORDER — DARBEPOETIN ALFA-POLYSORBATE 100 MCG/0.5ML IJ SOLN
100.0000 ug | INTRAMUSCULAR | Status: DC
  Administered 2013-04-08: 100 ug via SUBCUTANEOUS

## 2013-04-08 NOTE — Progress Notes (Signed)
Results for LAVIDA, PATCH (MRN [id number]) as of 04/08/2013 12:22  Ref. Range 04/08/2013 11:25  Hemoglobin Latest Range: 12.0-15.0 g/dL 9.8 (L)  HCT Latest Range: 36.0-46.0 % 29.6 (L)

## 2013-04-20 DIAGNOSIS — H353290 Exudative age-related macular degeneration, unspecified eye, stage unspecified: Secondary | ICD-10-CM | POA: Diagnosis not present

## 2013-04-20 DIAGNOSIS — H35059 Retinal neovascularization, unspecified, unspecified eye: Secondary | ICD-10-CM | POA: Diagnosis not present

## 2013-05-04 DIAGNOSIS — H35059 Retinal neovascularization, unspecified, unspecified eye: Secondary | ICD-10-CM | POA: Diagnosis not present

## 2013-05-04 DIAGNOSIS — H353290 Exudative age-related macular degeneration, unspecified eye, stage unspecified: Secondary | ICD-10-CM | POA: Diagnosis not present

## 2013-05-18 DIAGNOSIS — L821 Other seborrheic keratosis: Secondary | ICD-10-CM | POA: Diagnosis not present

## 2013-05-18 DIAGNOSIS — L570 Actinic keratosis: Secondary | ICD-10-CM | POA: Diagnosis not present

## 2013-05-18 DIAGNOSIS — Z85828 Personal history of other malignant neoplasm of skin: Secondary | ICD-10-CM | POA: Diagnosis not present

## 2013-05-27 DIAGNOSIS — N390 Urinary tract infection, site not specified: Secondary | ICD-10-CM | POA: Diagnosis not present

## 2013-05-27 DIAGNOSIS — R300 Dysuria: Secondary | ICD-10-CM | POA: Diagnosis not present

## 2013-06-07 ENCOUNTER — Encounter (HOSPITAL_COMMUNITY)
Admission: RE | Admit: 2013-06-07 | Discharge: 2013-06-07 | Disposition: A | Discharge status: Home / Self Care | Payer: Medicare Other | Source: Ambulatory Visit | Attending: Nephrology | Admitting: Nephrology

## 2013-06-07 DIAGNOSIS — N183 Chronic kidney disease, stage 3 unspecified: Secondary | ICD-10-CM | POA: Insufficient documentation

## 2013-06-07 DIAGNOSIS — D631 Anemia in chronic kidney disease: Secondary | ICD-10-CM | POA: Insufficient documentation

## 2013-06-07 DIAGNOSIS — Z79899 Other long term (current) drug therapy: Secondary | ICD-10-CM | POA: Diagnosis not present

## 2013-06-07 DIAGNOSIS — N039 Chronic nephritic syndrome with unspecified morphologic changes: Principal | ICD-10-CM

## 2013-06-07 LAB — RENAL FUNCTION PANEL
ALBUMIN: 3.4 g/dL — AB (ref 3.5–5.2)
BUN: 23 mg/dL (ref 6–23)
CALCIUM: 10 mg/dL (ref 8.4–10.5)
CHLORIDE: 97 meq/L (ref 96–112)
CO2: 33 mEq/L — ABNORMAL HIGH (ref 19–32)
Creatinine, Ser: 1.82 mg/dL — ABNORMAL HIGH (ref 0.50–1.10)
GFR calc Af Amer: 26 mL/min — ABNORMAL LOW (ref >90)
GFR, EST NON AFRICAN AMERICAN: 22 mL/min — AB (ref >90)
Glucose, Bld: 84 mg/dL (ref 70–99)
PHOSPHORUS: 3.5 mg/dL (ref 2.3–4.6)
Potassium: 4.3 mEq/L (ref 3.7–5.3)
Sodium: 139 mEq/L (ref 137–147)

## 2013-06-07 LAB — HEMOGLOBIN AND HEMATOCRIT, BLOOD
HEMATOCRIT: 27.5 % — AB (ref 36.0–46.0)
Hemoglobin: 9.1 g/dL — ABNORMAL LOW (ref 12.0–15.0)

## 2013-06-07 MED ORDER — DARBEPOETIN ALFA-POLYSORBATE 100 MCG/0.5ML IJ SOLN
100.0000 ug | INTRAMUSCULAR | Status: DC
  Administered 2013-06-07: 100 ug via SUBCUTANEOUS

## 2013-06-07 MED ORDER — DARBEPOETIN ALFA-POLYSORBATE 100 MCG/0.5ML IJ SOLN
INTRAMUSCULAR | Status: AC
Start: 2013-06-07 — End: 2013-06-07
  Filled 2013-06-07: qty 0.5

## 2013-06-07 NOTE — Progress Notes (Signed)
Results for [name] [name], [name] [name] (MRN [id number]) as of 06/07/2013 14:23  Ref. Range 06/07/2013 11:25  Sodium Latest Range: 137-147 mEq/L 139  Potassium Latest Range: 3.7-5.3 mEq/L 4.3  Chloride Latest Range: 96-112 mEq/L 97  CO2 Latest Range: 19-32 mEq/L 33 (H)  BUN Latest Range: 6-23 mg/dL 23  Creatinine Latest Range: 0.50-1.10 mg/dL 1.82 (H)  Calcium Latest Range: 8.4-10.5 mg/dL 10.0  GFR calc non Af Amer Latest Range: >90 mL/min 22 (L)  GFR calc Af Amer Latest Range: >90 mL/min 26 (L)  Glucose Latest Range: 70-99 mg/dL 84  Phosphorus Latest Range: 2.3-4.6 mg/dL 3.5  Albumin Latest Range: 3.5-5.2 g/dL 3.4 (L)  Results for [name] [name], [name] [name] (MRN [id number]) as of 06/07/2013 14:23  Ref. Range 06/07/2013 11:25  Hemoglobin Latest Range: 12.0-15.0 g/dL 9.1 (L)  HCT Latest Range: 36.0-46.0 % 27.5 (L)  Aranesp 13mcg given in clinic today

## 2013-06-11 ENCOUNTER — Other Ambulatory Visit: Payer: Self-pay | Admitting: Adult Health

## 2013-06-14 DIAGNOSIS — H353290 Exudative age-related macular degeneration, unspecified eye, stage unspecified: Secondary | ICD-10-CM | POA: Diagnosis not present

## 2013-06-14 DIAGNOSIS — H35059 Retinal neovascularization, unspecified, unspecified eye: Secondary | ICD-10-CM | POA: Diagnosis not present

## 2013-06-24 DIAGNOSIS — R300 Dysuria: Secondary | ICD-10-CM | POA: Diagnosis not present

## 2013-07-12 ENCOUNTER — Encounter: Payer: Self-pay | Admitting: Cardiology

## 2013-07-12 ENCOUNTER — Ambulatory Visit (INDEPENDENT_AMBULATORY_CARE_PROVIDER_SITE_OTHER): Payer: Medicare Other | Admitting: Cardiology

## 2013-07-12 VITALS — BP 148/80 | HR 68 | Ht 60.0 in | Wt 103.0 lb

## 2013-07-12 DIAGNOSIS — I359 Nonrheumatic aortic valve disorder, unspecified: Secondary | ICD-10-CM | POA: Diagnosis not present

## 2013-07-12 DIAGNOSIS — I10 Essential (primary) hypertension: Secondary | ICD-10-CM

## 2013-07-12 DIAGNOSIS — I350 Nonrheumatic aortic (valve) stenosis: Secondary | ICD-10-CM

## 2013-07-12 NOTE — Progress Notes (Signed)
Clinical Summary Ms. Tung is a 80 y.o.female seen today for follow up of the following medical problems.   1. Aortic stenosis  - last echo earlier this year showed moderate stenosis  - can have some DOE at moderate levels of exertion, family states one episode of chest pain after very heavy exertion a few weeks ago. No recurrent symptoms since that time. Dull pain in chest without any other associated symptoms, improved with ultram.   2. HTN  - checks bp at home occasionally, typically 150-160s/60s  - compliant with meds      Past Medical History  Diagnosis Date  . Backache, unspecified   . Dizziness and giddiness   . Thyrotoxicosis without mention of goiter or other cause, without mention of thyrotoxic crisis or storm   . Unspecified disorder resulting from impaired renal function   . Anemia, unspecified   . Unspecified essential hypertension   . COPD (chronic obstructive pulmonary disease)      Allergies  Allergen Reactions  . Neosporin [Neomycin-Bacitracin Zn-Polymyx] Rash  . Nitrofurantoin Monohyd Macro   . Penicillins      Current Outpatient Prescriptions  Medication Sig Dispense Refill  . acetaminophen (TYLENOL) 500 MG tablet Take 500 mg by mouth every 6 (six) hours as needed.        Marland Kitchen amLODipine (NORVASC) 5 MG tablet Take by mouth daily.       Marland Kitchen aspirin 81 MG tablet Take 81 mg by mouth daily.        . Bevacizumab (AVASTIN IV) Inject as directed.      Marland Kitchen BIOTIN PO Take by mouth daily.      . calcium-vitamin D (OSCAL WITH D) 500-200 MG-UNIT per tablet Take 1 tablet by mouth daily.        . darbepoetin alfa-polysorbate (ARANESP, ALBUMIN FREE,) 100 MCG/ML injection Inject 1 mL (100 mcg total) into the skin once.  0.4 mL  0  . docusate sodium (COLACE) 100 MG capsule Take 100 mg by mouth 2 (two) times daily.        Marland Kitchen gabapentin (NEURONTIN) 100 MG capsule Take 100 mg by mouth 1 day or 1 dose.      . hydrALAZINE (APRESOLINE) 10 MG tablet Take 10 mg by mouth as  directed.      . hydrochlorothiazide (MICROZIDE) 12.5 MG capsule Take 12.5 mg by mouth daily.      Marland Kitchen HYDROcodone-acetaminophen (NORCO/VICODIN) 5-325 MG per tablet Take 1/2 tablet every 4 hours as needed for pain  15 tablet  0  . latanoprost (XALATAN) 0.005 % ophthalmic solution       . levothyroxine (SYNTHROID, LEVOTHROID) 50 MCG tablet Take 50 mcg by mouth daily.        . metoprolol succinate (TOPROL-XL) 50 MG 24 hr tablet Take 50 mg by mouth daily.       . Multiple Vitamins-Minerals (MULTIVITAMIN WITH MINERALS) tablet Take 1 tablet by mouth daily.        . Multiple Vitamins-Minerals (VISION FORMULA PO) Take 1 tablet by mouth daily.        Marland Kitchen omeprazole (PRILOSEC) 20 MG capsule TAKE (2) CAPSULES BY MOUTH ONCE DAILY.  60 capsule  2  . omeprazole (PRILOSEC) 40 MG capsule Take 1 capsule (40 mg total) by mouth daily.  30 capsule  12  . ondansetron (ZOFRAN ODT) 4 MG disintegrating tablet Take 1 tablet (4 mg total) by mouth every 8 (eight) hours as needed for nausea.  20 tablet  0  .  polyethylene glycol (MIRALAX / GLYCOLAX) packet Take 17 g by mouth daily.        . sertraline (ZOLOFT) 50 MG tablet Take 100 mg by mouth daily.       . traMADol (ULTRAM) 50 MG tablet Take 50 mg by mouth every 6 (six) hours as needed.        Marland Kitchen ZOSTAVAX 30160 UNT/0.65ML injection        No current facility-administered medications for this visit.     Past Surgical History  Procedure Laterality Date  . Small bowel obst    . Salivary gland surgery Left   . Back surgery      Kyphoplasty     Allergies  Allergen Reactions  . Neosporin [Neomycin-Bacitracin Zn-Polymyx] Rash  . Nitrofurantoin Monohyd Macro   . Penicillins       No family history on file.   Social History Ms. Vacca reports that she has never smoked. She has never used smokeless tobacco. Ms. Granato reports that she does not drink alcohol.   Review of Systems CONSTITUTIONAL: No weight loss, fever, chills, weakness or fatigue.  HEENT: Eyes: No  visual loss, blurred vision, double vision or yellow sclerae.No hearing loss, sneezing, congestion, runny nose or sore throat.  SKIN: No rash or itching.  CARDIOVASCULAR: per HPI RESPIRATORY: No shortness of breath, cough or sputum.  GASTROINTESTINAL: No anorexia, nausea, vomiting or diarrhea. No abdominal pain or blood.  GENITOURINARY: No burning on urination, no polyuria NEUROLOGICAL: No headache, dizziness, syncope, paralysis, ataxia, numbness or tingling in the extremities. No change in bowel or bladder control.  MUSCULOSKELETAL: No muscle, back pain, joint pain or stiffness.  LYMPHATICS: No enlarged nodes. No history of splenectomy.  PSYCHIATRIC: No history of depression or anxiety.  ENDOCRINOLOGIC: No reports of sweating, cold or heat intolerance. No polyuria or polydipsia.  Marland Kitchen   Physical Examination p 68 bp 148/80 Wt 103 lbs BMI 20 Gen: resting comfortably, no acute distress HEENT: no scleral icterus, pupils equal round and reactive, no palptable cervical adenopathy,  CV: RRR, 3/6 early systolic murmur RUSB, no JVD Resp: Clear to auscultation bilaterally GI: abdomen is soft, non-tender, non-distended, normal bowel sounds, no hepatosplenomegaly MSK: extremities are warm, no edema.  Skin: warm, no rash Neuro:  no focal deficits Psych: appropriate affect   Diagnostic Studies 03/2011 Echo: LVEF 55-60%, mild LVH, no WMAs, grade I diastolic dysfunction, moderate AS (mead grad 13, AVA 1.15 by VTI, dimensionless index 0.45).   03/2011 Renal Artery Duplex: difficult study, normal renal arteries   03/2011 Carotid US:  1. Bilateral carotid bifurcation and proximal ICA plaque, resulting in less than 50% diameter stenosis. Little convincing progression since previous exam. The exam does not exclude plaque ulceration or embolization. Continued surveillance recommended.   03/2013 Echo Study Conclusions  - Left ventricle: The cavity size was normal. Wall thickness was increased in a  pattern of mild LVH. Systolic function was normal. The estimated ejection fraction was in the range of 50% to 55%. Although no diagnostic regional wall motion abnormality was identified, this possibility cannot be completely excluded on the basis of this study. Doppler parameters are consistent with abnormal left ventricular relaxation (grade 1 diastolic dysfunction). - Aortic valve: Moderately calcified annulus. Possibly functionally bicuspid; moderately calcified leaflets. Cusp separation was severely reduced. There was moderate to severe stenosis. Mild regurgitation. Mean gradient: 57mm Hg (S). Peak gradient: 63mm Hg (S). LVOT/AV VTI ratio 0.36. Valve area: 1.18cm^2(VTI). - Mitral valve: Calcified annulus. Mildly thickened leaflets . Trivial regurgitation. -  Left atrium: The atrium was mildly dilated. - Right atrium: Central venous pressure: 49mm Hg (est). - Atrial septum: No defect or patent foramen ovale was identified. - Tricuspid valve: Trivial regurgitation. - Pulmonary arteries: PA peak pressure: 35mm Hg (S). - Pericardium, extracardiac: A prominent pericardial fat pad was present. Impressions:  - Mild LVH with LVEF 56-38%, grade 1 diastolic dysfunction. Mild left atrial enlargement. MAC with trivial mitral regurgitation. Moderate to severe aortic stenosis as outlined above. Gradients have increased somewhat compared to prior study. Mild aortic regurgitation. PASP 39 mmHg.     Assessment and Plan  1. Aortic stenosis  - moderate by echo in 2015 (AVA 1.2, mean grad 24).  - stable DOE at moderate levels of exertion, for her age she is very active and able to tolerate fairly high levels of activity - likely poor candidate for any form of intervention in the future given her age, will continue to follow clinically.   2. HTN  - reasonable control given her age and prior orthostatic symptoms, continue current meds  3. Carotid stenosis  - mild didsease on last [place] in  03/2011, no symptoms  - continue to follow clinically, would not pursue prophylactic intervention given her age and overall risk.   4. Dizziness  - improved since decreasing Toprol  - continue to follow          Arnoldo Lenis, M.D., F.A.C.C.

## 2013-07-12 NOTE — Patient Instructions (Signed)
Your physician wants you to follow-up in: 6 months You will receive a reminder letter in the mail two months in advance. If you don't receive a letter, please call our office to schedule the follow-up appointment.     Your physician recommends that you continue on your current medications as directed. Please refer to the Current Medication list given to you today.      Thank you for choosing [facility] !        

## 2013-07-21 DIAGNOSIS — H35059 Retinal neovascularization, unspecified, unspecified eye: Secondary | ICD-10-CM | POA: Diagnosis not present

## 2013-07-21 DIAGNOSIS — H353290 Exudative age-related macular degeneration, unspecified eye, stage unspecified: Secondary | ICD-10-CM | POA: Diagnosis not present

## 2013-08-02 ENCOUNTER — Encounter (HOSPITAL_COMMUNITY)
Admission: RE | Admit: 2013-08-02 | Discharge: 2013-08-02 | Disposition: A | Discharge status: Home / Self Care | Payer: Medicare Other | Source: Ambulatory Visit | Attending: Nephrology | Admitting: Nephrology

## 2013-08-02 DIAGNOSIS — N039 Chronic nephritic syndrome with unspecified morphologic changes: Principal | ICD-10-CM

## 2013-08-02 DIAGNOSIS — D631 Anemia in chronic kidney disease: Secondary | ICD-10-CM | POA: Insufficient documentation

## 2013-08-02 DIAGNOSIS — N183 Chronic kidney disease, stage 3 unspecified: Secondary | ICD-10-CM | POA: Insufficient documentation

## 2013-08-02 LAB — RENAL FUNCTION PANEL
Albumin: 3.6 g/dL (ref 3.5–5.2)
Anion gap: 8 (ref 5–15)
BUN: 24 mg/dL — ABNORMAL HIGH (ref 6–23)
CALCIUM: 10.2 mg/dL (ref 8.4–10.5)
CO2: 33 mEq/L — ABNORMAL HIGH (ref 19–32)
CREATININE: 1.72 mg/dL — AB (ref 0.50–1.10)
Chloride: 101 mEq/L (ref 96–112)
GFR calc Af Amer: 28 mL/min — ABNORMAL LOW (ref >90)
GFR calc non Af Amer: 24 mL/min — ABNORMAL LOW (ref >90)
GLUCOSE: 79 mg/dL (ref 70–99)
Phosphorus: 3.5 mg/dL (ref 2.3–4.6)
Potassium: 4.6 mEq/L (ref 3.7–5.3)
Sodium: 142 mEq/L (ref 137–147)

## 2013-08-02 LAB — HEMOGLOBIN AND HEMATOCRIT, BLOOD
HCT: 32.3 % — ABNORMAL LOW (ref 36.0–46.0)
Hemoglobin: 10.6 g/dL — ABNORMAL LOW (ref 12.0–15.0)

## 2013-08-02 NOTE — Progress Notes (Signed)
[place] faxed to Dr Lowanda Foster

## 2013-08-02 NOTE — Progress Notes (Signed)
Hgb 10.6 therefore aranesp not given.

## 2013-08-02 NOTE — Progress Notes (Signed)
Renal panel faxed to Dr Lowanda Foster

## 2013-08-09 DIAGNOSIS — N390 Urinary tract infection, site not specified: Secondary | ICD-10-CM | POA: Diagnosis not present

## 2013-08-15 DIAGNOSIS — H3560 Retinal hemorrhage, unspecified eye: Secondary | ICD-10-CM | POA: Diagnosis not present

## 2013-08-15 DIAGNOSIS — H353290 Exudative age-related macular degeneration, unspecified eye, stage unspecified: Secondary | ICD-10-CM | POA: Diagnosis not present

## 2013-08-15 DIAGNOSIS — H35059 Retinal neovascularization, unspecified, unspecified eye: Secondary | ICD-10-CM | POA: Diagnosis not present

## 2013-08-17 DIAGNOSIS — N189 Chronic kidney disease, unspecified: Secondary | ICD-10-CM | POA: Diagnosis not present

## 2013-08-17 DIAGNOSIS — D649 Anemia, unspecified: Secondary | ICD-10-CM | POA: Diagnosis not present

## 2013-08-17 DIAGNOSIS — Z79899 Other long term (current) drug therapy: Secondary | ICD-10-CM | POA: Diagnosis not present

## 2013-08-18 ENCOUNTER — Encounter (HOSPITAL_COMMUNITY)
Admission: RE | Admit: 2013-08-18 | Discharge: 2013-08-18 | Disposition: A | Discharge status: Home / Self Care | Payer: Medicare Other | Source: Ambulatory Visit | Attending: Nephrology | Admitting: Nephrology

## 2013-08-18 DIAGNOSIS — N183 Chronic kidney disease, stage 3 unspecified: Secondary | ICD-10-CM | POA: Diagnosis not present

## 2013-08-18 DIAGNOSIS — D631 Anemia in chronic kidney disease: Secondary | ICD-10-CM | POA: Diagnosis not present

## 2013-08-18 MED ORDER — DARBEPOETIN ALFA-POLYSORBATE 100 MCG/0.5ML IJ SOLN
100.0000 ug | INTRAMUSCULAR | Status: AC
  Administered 2013-08-18: 100 ug via SUBCUTANEOUS

## 2013-08-18 MED ORDER — DARBEPOETIN ALFA-POLYSORBATE 100 MCG/0.5ML IJ SOLN
INTRAMUSCULAR | Status: AC
Start: 2013-08-18 — End: 2013-08-18
  Filled 2013-08-18: qty 0.5

## 2013-08-30 ENCOUNTER — Other Ambulatory Visit: Payer: Self-pay | Admitting: Dermatology

## 2013-08-30 DIAGNOSIS — L723 Sebaceous cyst: Secondary | ICD-10-CM | POA: Diagnosis not present

## 2013-08-30 DIAGNOSIS — L821 Other seborrheic keratosis: Secondary | ICD-10-CM | POA: Diagnosis not present

## 2013-08-30 DIAGNOSIS — D485 Neoplasm of uncertain behavior of skin: Secondary | ICD-10-CM | POA: Diagnosis not present

## 2013-09-06 DIAGNOSIS — N183 Chronic kidney disease, stage 3 unspecified: Secondary | ICD-10-CM | POA: Diagnosis not present

## 2013-09-06 DIAGNOSIS — E039 Hypothyroidism, unspecified: Secondary | ICD-10-CM | POA: Diagnosis not present

## 2013-09-06 DIAGNOSIS — I10 Essential (primary) hypertension: Secondary | ICD-10-CM | POA: Diagnosis not present

## 2013-09-06 DIAGNOSIS — D649 Anemia, unspecified: Secondary | ICD-10-CM | POA: Diagnosis not present

## 2013-09-19 DIAGNOSIS — H353290 Exudative age-related macular degeneration, unspecified eye, stage unspecified: Secondary | ICD-10-CM | POA: Diagnosis not present

## 2013-09-19 DIAGNOSIS — H35059 Retinal neovascularization, unspecified, unspecified eye: Secondary | ICD-10-CM | POA: Diagnosis not present

## 2013-09-27 ENCOUNTER — Other Ambulatory Visit (HOSPITAL_COMMUNITY): Payer: Medicare Other

## 2013-10-13 ENCOUNTER — Encounter (HOSPITAL_COMMUNITY)
Admission: RE | Admit: 2013-10-13 | Discharge: 2013-10-13 | Disposition: A | Discharge status: Home / Self Care | Payer: Medicare Other | Source: Ambulatory Visit | Attending: Nephrology | Admitting: Nephrology

## 2013-10-13 DIAGNOSIS — N039 Chronic nephritic syndrome with unspecified morphologic changes: Principal | ICD-10-CM

## 2013-10-13 DIAGNOSIS — N183 Chronic kidney disease, stage 3 unspecified: Secondary | ICD-10-CM | POA: Diagnosis not present

## 2013-10-13 DIAGNOSIS — D631 Anemia in chronic kidney disease: Secondary | ICD-10-CM | POA: Diagnosis not present

## 2013-10-13 LAB — RENAL FUNCTION PANEL
Albumin: 3.6 g/dL (ref 3.5–5.2)
Anion gap: 10 (ref 5–15)
BUN: 29 mg/dL — ABNORMAL HIGH (ref 6–23)
CALCIUM: 9.6 mg/dL (ref 8.4–10.5)
CHLORIDE: 98 meq/L (ref 96–112)
CO2: 31 meq/L (ref 19–32)
Creatinine, Ser: 1.82 mg/dL — ABNORMAL HIGH (ref 0.50–1.10)
GFR, EST AFRICAN AMERICAN: 26 mL/min — AB (ref >90)
GFR, EST NON AFRICAN AMERICAN: 22 mL/min — AB (ref >90)
Glucose, Bld: 119 mg/dL — ABNORMAL HIGH (ref 70–99)
Phosphorus: 3.7 mg/dL (ref 2.3–4.6)
Potassium: 4 mEq/L (ref 3.7–5.3)
Sodium: 139 mEq/L (ref 137–147)

## 2013-10-13 LAB — HEMOGLOBIN AND HEMATOCRIT, BLOOD
HEMATOCRIT: 31.1 % — AB (ref 36.0–46.0)
HEMOGLOBIN: 10.2 g/dL — AB (ref 12.0–15.0)

## 2013-10-13 NOTE — Progress Notes (Signed)
Results for LUBA, MATZEN (MRN [id number]) as of 10/13/2013 14:38  Ref. Range 10/13/2013 13:50  Sodium Latest Range: 137-147 mEq/L 139  Potassium Latest Range: 3.7-5.3 mEq/L 4.0  Chloride Latest Range: 96-112 mEq/L 98  CO2 Latest Range: 19-32 mEq/L 31  BUN Latest Range: 6-23 mg/dL 29 (H)  Creatinine Latest Range: 0.50-1.10 mg/dL 1.82 (H)  Calcium Latest Range: 8.4-10.5 mg/dL 9.6  GFR calc non Af Amer Latest Range: >90 mL/min 22 (L)  GFR calc Af Amer Latest Range: >90 mL/min 26 (L)  Glucose Latest Range: 70-99 mg/dL 119 (H)  Anion gap Latest Range: 5-15  10  Phosphorus Latest Range: 2.3-4.6 mg/dL 3.7  Albumin Latest Range: 3.5-5.2 g/dL 3.6  Hemoglobin Latest Range: 12.0-15.0 g/dL 10.2 (L)  HCT Latest Range: 36.0-46.0 % 31.1 (L)  Aransep not given due to Hgb 10.2.

## 2013-10-19 DIAGNOSIS — H353290 Exudative age-related macular degeneration, unspecified eye, stage unspecified: Secondary | ICD-10-CM | POA: Diagnosis not present

## 2013-10-19 DIAGNOSIS — H35359 Cystoid macular degeneration, unspecified eye: Secondary | ICD-10-CM | POA: Diagnosis not present

## 2013-10-19 DIAGNOSIS — H35059 Retinal neovascularization, unspecified, unspecified eye: Secondary | ICD-10-CM | POA: Diagnosis not present

## 2013-11-01 DIAGNOSIS — H3531 Nonexudative age-related macular degeneration: Secondary | ICD-10-CM | POA: Diagnosis not present

## 2013-11-01 DIAGNOSIS — H3532 Exudative age-related macular degeneration: Secondary | ICD-10-CM | POA: Diagnosis not present

## 2013-11-02 DIAGNOSIS — D638 Anemia in other chronic diseases classified elsewhere: Secondary | ICD-10-CM | POA: Diagnosis not present

## 2013-11-02 DIAGNOSIS — Z79899 Other long term (current) drug therapy: Secondary | ICD-10-CM | POA: Diagnosis not present

## 2013-11-04 ENCOUNTER — Encounter (HOSPITAL_COMMUNITY)
Admission: RE | Admit: 2013-11-04 | Discharge: 2013-11-04 | Disposition: A | Discharge status: Home / Self Care | Payer: Medicare Other | Source: Ambulatory Visit | Attending: Nephrology | Admitting: Nephrology

## 2013-11-04 DIAGNOSIS — N183 Chronic kidney disease, stage 3 (moderate): Secondary | ICD-10-CM | POA: Insufficient documentation

## 2013-11-04 DIAGNOSIS — D631 Anemia in chronic kidney disease: Secondary | ICD-10-CM | POA: Insufficient documentation

## 2013-11-04 MED ORDER — DARBEPOETIN ALFA-POLYSORBATE 100 MCG/0.5ML IJ SOLN: 100.0000 ug | Freq: Once | INTRAMUSCULAR | Status: AC

## 2013-11-04 MED ORDER — DARBEPOETIN ALFA-POLYSORBATE 100 MCG/0.5ML IJ SOLN
100.0000 ug | INTRAMUSCULAR | Status: DC
  Administered 2013-11-04: 100 ug via SUBCUTANEOUS

## 2013-11-04 MED ORDER — DARBEPOETIN ALFA-POLYSORBATE 40 MCG/0.4ML IJ SOLN: 100.0000 ug | Freq: Once | INTRAMUSCULAR | Status: DC

## 2013-11-04 NOTE — Progress Notes (Signed)
Aranesp 152mcg SQ given for H/H 9.7 and 29.0 per [employer]. Daughter will call Clinic for next appt time, does not want to come in eight weeks she will call Dr Lowanda Foster.

## 2013-11-23 DIAGNOSIS — Z23 Encounter for immunization: Secondary | ICD-10-CM | POA: Diagnosis not present

## 2013-11-23 DIAGNOSIS — R300 Dysuria: Secondary | ICD-10-CM | POA: Diagnosis not present

## 2013-11-29 DIAGNOSIS — Z23 Encounter for immunization: Secondary | ICD-10-CM | POA: Diagnosis not present

## 2013-11-30 DIAGNOSIS — H35051 Retinal neovascularization, unspecified, right eye: Secondary | ICD-10-CM | POA: Diagnosis not present

## 2013-11-30 DIAGNOSIS — H3532 Exudative age-related macular degeneration: Secondary | ICD-10-CM | POA: Diagnosis not present

## 2013-12-12 ENCOUNTER — Other Ambulatory Visit: Payer: Self-pay | Admitting: Adult Health

## 2013-12-15 ENCOUNTER — Other Ambulatory Visit: Payer: Self-pay | Admitting: Dermatology

## 2013-12-15 DIAGNOSIS — Z85828 Personal history of other malignant neoplasm of skin: Secondary | ICD-10-CM | POA: Diagnosis not present

## 2013-12-15 DIAGNOSIS — D0439 Carcinoma in situ of skin of other parts of face: Secondary | ICD-10-CM | POA: Diagnosis not present

## 2013-12-15 DIAGNOSIS — L821 Other seborrheic keratosis: Secondary | ICD-10-CM | POA: Diagnosis not present

## 2013-12-15 DIAGNOSIS — D485 Neoplasm of uncertain behavior of skin: Secondary | ICD-10-CM | POA: Diagnosis not present

## 2013-12-15 DIAGNOSIS — Z808 Family history of malignant neoplasm of other organs or systems: Secondary | ICD-10-CM | POA: Diagnosis not present

## 2013-12-27 DIAGNOSIS — H3532 Exudative age-related macular degeneration: Secondary | ICD-10-CM | POA: Diagnosis not present

## 2013-12-27 DIAGNOSIS — H35052 Retinal neovascularization, unspecified, left eye: Secondary | ICD-10-CM | POA: Diagnosis not present

## 2014-01-13 ENCOUNTER — Encounter (HOSPITAL_COMMUNITY)
Admission: RE | Admit: 2014-01-13 | Discharge: 2014-01-13 | Disposition: A | Discharge status: Home / Self Care | Payer: Medicare Other | Source: Ambulatory Visit | Attending: Nephrology | Admitting: Nephrology

## 2014-01-13 DIAGNOSIS — N183 Chronic kidney disease, stage 3 (moderate): Secondary | ICD-10-CM | POA: Insufficient documentation

## 2014-01-13 DIAGNOSIS — D631 Anemia in chronic kidney disease: Secondary | ICD-10-CM | POA: Diagnosis not present

## 2014-01-13 LAB — RENAL FUNCTION PANEL
Albumin: 3.5 g/dL (ref 3.5–5.2)
Anion gap: 12 (ref 5–15)
BUN: 24 mg/dL — ABNORMAL HIGH (ref 6–23)
CHLORIDE: 101 meq/L (ref 96–112)
CO2: 30 meq/L (ref 19–32)
CREATININE: 1.81 mg/dL — AB (ref 0.50–1.10)
Calcium: 10 mg/dL (ref 8.4–10.5)
GFR calc Af Amer: 26 mL/min — ABNORMAL LOW (ref >90)
GFR calc non Af Amer: 22 mL/min — ABNORMAL LOW (ref >90)
Glucose, Bld: 97 mg/dL (ref 70–99)
Phosphorus: 3.6 mg/dL (ref 2.3–4.6)
Potassium: 3.6 mEq/L — ABNORMAL LOW (ref 3.7–5.3)
Sodium: 143 mEq/L (ref 137–147)

## 2014-01-13 LAB — HEMOGLOBIN AND HEMATOCRIT, BLOOD
HCT: 28.9 % — ABNORMAL LOW (ref 36.0–46.0)
HEMOGLOBIN: 9.4 g/dL — AB (ref 12.0–15.0)

## 2014-01-13 MED ORDER — DARBEPOETIN ALFA 100 MCG/0.5ML IJ SOSY
PREFILLED_SYRINGE | INTRAMUSCULAR | Status: AC
  Filled 2014-01-13: qty 0.5

## 2014-01-13 MED ORDER — DARBEPOETIN ALFA 100 MCG/0.5ML IJ SOSY
100.0000 ug | PREFILLED_SYRINGE | INTRAMUSCULAR | Status: DC
Start: 2014-01-13 — End: 2014-01-14
  Administered 2014-01-13: 100 ug via SUBCUTANEOUS

## 2014-01-13 NOTE — Progress Notes (Signed)
Results for CHELSE, MATAS (MRN [id number]) as of 01/13/2014 12:52  Ref. Range 01/13/2014 11:05  Sodium Latest Range: 137-147 mEq/L 143  Potassium Latest Range: 3.7-5.3 mEq/L 3.6 (L)  Chloride Latest Range: 96-112 mEq/L 101  CO2 Latest Range: 19-32 mEq/L 30  BUN Latest Range: 6-23 mg/dL 24 (H)  Creatinine Latest Range: 0.50-1.10 mg/dL 1.81 (H)  Calcium Latest Range: 8.4-10.5 mg/dL 10.0  GFR calc non Af Amer Latest Range: >90 mL/min 22 (L)  GFR calc Af Amer Latest Range: >90 mL/min 26 (L)  Glucose Latest Range: 70-99 mg/dL 97  Anion gap Latest Range: 5-15  12  Phosphorus Latest Range: 2.3-4.6 mg/dL 3.6  Albumin Latest Range: 3.5-5.2 g/dL 3.5  Hemoglobin Latest Range: 12.0-15.0 g/dL 9.4 (L)  HCT Latest Range: 36.0-46.0 % 28.9 (L)

## 2014-01-13 NOTE — Progress Notes (Signed)
Here for labs and aranesp if indicated. Dx N18.3, D63.1

## 2014-01-30 DIAGNOSIS — M5410 Radiculopathy, site unspecified: Secondary | ICD-10-CM | POA: Diagnosis not present

## 2014-01-30 DIAGNOSIS — R062 Wheezing: Secondary | ICD-10-CM | POA: Diagnosis not present

## 2014-02-03 DIAGNOSIS — Z9181 History of falling: Secondary | ICD-10-CM | POA: Diagnosis not present

## 2014-02-03 DIAGNOSIS — E039 Hypothyroidism, unspecified: Secondary | ICD-10-CM | POA: Diagnosis not present

## 2014-02-03 DIAGNOSIS — N183 Chronic kidney disease, stage 3 (moderate): Secondary | ICD-10-CM | POA: Diagnosis not present

## 2014-02-17 ENCOUNTER — Ambulatory Visit: Payer: Medicare Other | Admitting: Occupational Therapy

## 2014-03-16 ENCOUNTER — Encounter (HOSPITAL_COMMUNITY): Payer: Self-pay

## 2014-03-16 ENCOUNTER — Emergency Department (HOSPITAL_COMMUNITY)
Admission: EM | Admit: 2014-03-16 | Discharge: 2014-03-16 | Disposition: A | Discharge status: Home / Self Care | Payer: Medicare Other | Attending: Emergency Medicine | Admitting: Emergency Medicine

## 2014-03-16 ENCOUNTER — Emergency Department (HOSPITAL_COMMUNITY): Payer: Medicare Other

## 2014-03-16 DIAGNOSIS — J449 Chronic obstructive pulmonary disease, unspecified: Secondary | ICD-10-CM | POA: Diagnosis not present

## 2014-03-16 DIAGNOSIS — Y998 Other external cause status: Secondary | ICD-10-CM | POA: Insufficient documentation

## 2014-03-16 DIAGNOSIS — K5900 Constipation, unspecified: Secondary | ICD-10-CM | POA: Insufficient documentation

## 2014-03-16 DIAGNOSIS — Z8639 Personal history of other endocrine, nutritional and metabolic disease: Secondary | ICD-10-CM | POA: Diagnosis not present

## 2014-03-16 DIAGNOSIS — Y9289 Other specified places as the place of occurrence of the external cause: Secondary | ICD-10-CM | POA: Diagnosis not present

## 2014-03-16 DIAGNOSIS — I10 Essential (primary) hypertension: Secondary | ICD-10-CM | POA: Diagnosis not present

## 2014-03-16 DIAGNOSIS — Z7952 Long term (current) use of systemic steroids: Secondary | ICD-10-CM | POA: Diagnosis not present

## 2014-03-16 DIAGNOSIS — N289 Disorder of kidney and ureter, unspecified: Secondary | ICD-10-CM | POA: Diagnosis not present

## 2014-03-16 DIAGNOSIS — S32000A Wedge compression fracture of unspecified lumbar vertebra, initial encounter for closed fracture: Secondary | ICD-10-CM

## 2014-03-16 DIAGNOSIS — S32030A Wedge compression fracture of third lumbar vertebra, initial encounter for closed fracture: Secondary | ICD-10-CM | POA: Diagnosis not present

## 2014-03-16 DIAGNOSIS — X58XXXA Exposure to other specified factors, initial encounter: Secondary | ICD-10-CM | POA: Diagnosis not present

## 2014-03-16 DIAGNOSIS — Z79899 Other long term (current) drug therapy: Secondary | ICD-10-CM | POA: Diagnosis not present

## 2014-03-16 DIAGNOSIS — S32020A Wedge compression fracture of second lumbar vertebra, initial encounter for closed fracture: Secondary | ICD-10-CM | POA: Diagnosis not present

## 2014-03-16 DIAGNOSIS — D649 Anemia, unspecified: Secondary | ICD-10-CM

## 2014-03-16 DIAGNOSIS — Z7982 Long term (current) use of aspirin: Secondary | ICD-10-CM | POA: Diagnosis not present

## 2014-03-16 DIAGNOSIS — Y9389 Activity, other specified: Secondary | ICD-10-CM | POA: Insufficient documentation

## 2014-03-16 DIAGNOSIS — S3992XA Unspecified injury of lower back, initial encounter: Secondary | ICD-10-CM | POA: Diagnosis present

## 2014-03-16 DIAGNOSIS — Z87448 Personal history of other diseases of urinary system: Secondary | ICD-10-CM | POA: Insufficient documentation

## 2014-03-16 DIAGNOSIS — M549 Dorsalgia, unspecified: Secondary | ICD-10-CM

## 2014-03-16 LAB — CBC WITH DIFFERENTIAL/PLATELET
BASOS PCT: 0 % (ref 0–1)
Basophils Absolute: 0 10*3/uL (ref 0.0–0.1)
EOS ABS: 0.2 10*3/uL (ref 0.0–0.7)
Eosinophils Relative: 3 % (ref 0–5)
HEMATOCRIT: 30 % — AB (ref 36.0–46.0)
Hemoglobin: 9.6 g/dL — ABNORMAL LOW (ref 12.0–15.0)
LYMPHS PCT: 13 % (ref 12–46)
Lymphs Abs: 1 10*3/uL (ref 0.7–4.0)
MCH: 30.2 pg (ref 26.0–34.0)
MCHC: 32 g/dL (ref 30.0–36.0)
MCV: 94.3 fL (ref 78.0–100.0)
Monocytes Absolute: 1 10*3/uL (ref 0.1–1.0)
Monocytes Relative: 13 % — ABNORMAL HIGH (ref 3–12)
Neutro Abs: 5.5 10*3/uL (ref 1.7–7.7)
Neutrophils Relative %: 71 % (ref 43–77)
PLATELETS: 332 10*3/uL (ref 150–400)
RBC: 3.18 MIL/uL — AB (ref 3.87–5.11)
RDW: 13.5 % (ref 11.5–15.5)
WBC: 7.7 10*3/uL (ref 4.0–10.5)

## 2014-03-16 LAB — URINE MICROSCOPIC-ADD ON

## 2014-03-16 LAB — BASIC METABOLIC PANEL
ANION GAP: 7 (ref 5–15)
BUN: 28 mg/dL — ABNORMAL HIGH (ref 6–23)
CHLORIDE: 100 mmol/L (ref 96–112)
CO2: 31 mmol/L (ref 19–32)
CREATININE: 1.74 mg/dL — AB (ref 0.50–1.10)
Calcium: 9.3 mg/dL (ref 8.4–10.5)
GFR, EST AFRICAN AMERICAN: 27 mL/min — AB (ref >90)
GFR, EST NON AFRICAN AMERICAN: 23 mL/min — AB (ref >90)
Glucose, Bld: 90 mg/dL (ref 70–99)
Potassium: 4.3 mmol/L (ref 3.5–5.1)
Sodium: 138 mmol/L (ref 135–145)

## 2014-03-16 LAB — URINALYSIS, ROUTINE W REFLEX MICROSCOPIC
Bilirubin Urine: NEGATIVE
Glucose, UA: NEGATIVE mg/dL
Hgb urine dipstick: NEGATIVE
KETONES UR: NEGATIVE mg/dL
Nitrite: NEGATIVE
Protein, ur: NEGATIVE mg/dL
Specific Gravity, Urine: 1.02 (ref 1.005–1.030)
Urobilinogen, UA: 0.2 mg/dL (ref 0.0–1.0)
pH: 8.5 — ABNORMAL HIGH (ref 5.0–8.0)

## 2014-03-16 MED ORDER — MORPHINE SULFATE 2 MG/ML IJ SOLN
2.0000 mg | Freq: Once | INTRAMUSCULAR | Status: AC
  Administered 2014-03-16: 2 mg via INTRAVENOUS
  Filled 2014-03-16: qty 1

## 2014-03-16 NOTE — ED Notes (Signed)
Pt ambulate to desk and back, per family member pt is walking better than before, still with pain with getting up

## 2014-03-16 NOTE — ED Provider Notes (Signed)
CSN: [id number]     Arrival date & time 03/16/14  1055 History  This chart was scribed for Sharyon Cable, MD by Edison Simon, ED Scribe. This patient was seen in room APA06/APA06 and the patient's care was started at 1:15 PM.    Chief Complaint  Patient presents with  . Back Pain   Patient is a 81 y.o. female presenting with back pain. The history is provided by the patient and a relative. No language interpreter was used.  Back Pain Location:  Lumbar spine Quality:  Unable to specify Radiates to: left leg, right leg, right toe. Pain severity:  Moderate Pain is:  Same all the time Onset quality:  Gradual Duration:  3 days Timing:  Constant Progression:  Worsening Chronicity:  Recurrent Context: twisting   Context: not falling and not recent injury   Relieved by: Prednisone, physical therapy. Worsened by:  Movement and twisting Ineffective treatments:  None tried Associated symptoms: abdominal pain   Associated symptoms: no chest pain, no dysuria and no fever   Risk factors: hx of osteoporosis     HPI Comments: CARLISS PORCARO is a 81 y.o. female with history of osteoporosis who presents to the Emergency Department complaining of low back pain radiating to both legs and right toe with onset 3 days ago. She states her pain began on January 10, 2014 after twisting while getting out of bed; she used Prednisone with improvement but still had pain in January and saw her PCP at which time she refused x-ray; she had another round of prednisone and had home health and PT for 4 weeks with significant improvement which she finished 6 days ago. She states pain recurred 3 days ago to right side, worsening progressively and now affecting both sides. She states pain is worse with twisting or trying to rise. She denies any falls. She reports associated abdominal pain, difficulty urinating, and constipation. She reports prior kyphoplasty to L1. She denies use of blood thinners besides 81mg  ASA. She  denies fever, vomiting, chest pain, or dysuria.  Past Medical History  Diagnosis Date  . Backache, unspecified   . Dizziness and giddiness   . Thyrotoxicosis without mention of goiter or other cause, without mention of thyrotoxic crisis or storm   . Unspecified disorder resulting from impaired renal function   . Anemia, unspecified   . Unspecified essential hypertension   . COPD (chronic obstructive pulmonary disease)    Past Surgical History  Procedure Laterality Date  . Small bowel obst    . Salivary gland surgery Left   . Back surgery      Kyphoplasty   No family history on file. History  Substance Use Topics  . Smoking status: Never Smoker   . Smokeless tobacco: Never Used  . Alcohol Use: No   OB History    No data available     Review of Systems  Constitutional: Negative for fever.  Cardiovascular: Negative for chest pain.  Gastrointestinal: Positive for abdominal pain and constipation. Negative for vomiting.  Genitourinary: Positive for difficulty urinating. Negative for dysuria.  Musculoskeletal: Positive for back pain.  All other systems reviewed and are negative.     Allergies  Neosporin; Nitrofurantoin monohyd macro; and Penicillins   Home Medications   Prior to Admission medications   Medication Sig Start Date End Date Taking? Authorizing Provider  acetaminophen (TYLENOL) 500 MG tablet Take 1,000 mg by mouth every 6 (six) hours as needed for moderate pain.    Yes Historical  Provider, MD  amLODipine (NORVASC) 10 MG tablet Take 10 mg by mouth daily.   Yes Historical Provider, MD  aspirin 81 MG tablet Take 81 mg by mouth daily.     Yes Historical Provider, MD  docusate sodium (COLACE) 100 MG capsule Take 100 mg by mouth 2 (two) times daily.     Yes Historical Provider, MD  metoprolol succinate (TOPROL-XL) 50 MG 24 hr tablet Take 50 mg by mouth daily.  07/23/10  Yes Renella Cunas, MD  calcium-vitamin D (OSCAL WITH D) 500-200 MG-UNIT per tablet Take 1 tablet  by mouth daily.      Historical Provider, MD  darbepoetin alfa-polysorbate (ARANESP, ALBUMIN FREE,) 100 MCG/ML injection Inject 1 mL (100 mcg total) into the skin once. 08/14/10   Harriett Sine, MD  hydrochlorothiazide (MICROZIDE) 12.5 MG capsule Take 12.5 mg by mouth daily.    Historical Provider, MD  latanoprost (XALATAN) 0.005 % ophthalmic solution  07/28/11   Historical Provider, MD  levothyroxine (SYNTHROID, LEVOTHROID) 50 MCG tablet Take 50 mcg by mouth daily.      Historical Provider, MD  Multiple Vitamins-Minerals (MULTIVITAMIN WITH MINERALS) tablet Take 1 tablet by mouth daily.      Historical Provider, MD  Multiple Vitamins-Minerals (VISION FORMULA PO) Take 1 tablet by mouth daily.      Historical Provider, MD  omeprazole (PRILOSEC) 20 MG capsule TAKE (2) CAPSULES BY MOUTH ONCE DAILY. 12/12/13   Arnoldo Lenis, MD  polyethylene glycol [facility] / Floria Raveling) packet Take 17 g by mouth daily.      Historical Provider, MD  predniSONE (DELTASONE) 5 MG tablet Take 1 tablet by mouth daily. 01/30/14   Historical Provider, MD  sertraline (ZOLOFT) 50 MG tablet Take 100 mg by mouth daily.     Historical Provider, MD  traMADol (ULTRAM) 50 MG tablet Take 50 mg by mouth every 6 (six) hours as needed.      Historical Provider, MD  ZOSTAVAX [id number] UNT/0.65ML injection  09/05/11   Historical Provider, MD   BP 123/52 mmHg  Pulse 66  Temp(Src) 98 F (36.7 C) (Oral)  Resp 24  Ht 4\' 11"  (1.499 m)  Wt 101 lb (45.813 kg)  BMI 20.39 kg/m2  SpO2 100% Physical Exam  Nursing note and vitals reviewed.  CONSTITUTIONAL: elderly and frail HEAD: Normocephalic/atraumatic EYES: EOMI/PERRL ENMT: Mucous membranes moist NECK: supple no meningeal signs SPINE/BACK: severe tenderness to lumbar spine, kyphotic spine noted, No bruising/crepitance/stepoffs noted to spine  CV: P8/K9 noted, systolic murmur noted LUNGS: Lungs are clear to auscultation bilaterally, no apparent distress ABDOMEN: soft, nontender, no  rebound or guarding GU:no cva tenderness NEURO: Awake/alert, equal motor strength noted with the following: hip flexion/knee flexion/extension, foot dorsi/plantar flexion, great toe extension intact bilaterally, no clonus bilaterally, plantar reflex appropriate (toes downgoing), no sensory deficit in any dermatome.  Equal patellar/achilles reflex noted  in bilateral lower extremities.  EXTREMITIES: pulses normal, full ROM SKIN: warm, color normal PSYCH: no abnormalities of mood noted, alert and oriented to situation   ED Course  Procedures  DIAGNOSTIC STUDIES: Oxygen Saturation is 100% on room air, normal by my interpretation.    COORDINATION OF CARE: 1:28 PM Discussed treatment plan with patient at beside, including lab work and MRI. The patient agrees with the plan and has no further questions at this time.   Pt improved We discussed MR findings Her abdominal pain is resolved (no tenderness on exam) She can ambulate and daughter who lives with her feels comfortable taking her  home She has tramadol at home and feels comfortable taking this Pt is appropriate for d/c home   Labs Review Labs Reviewed  BASIC METABOLIC PANEL - Abnormal; Notable for the following:    BUN 28 (*)    Creatinine, Ser 1.74 (*)    GFR calc non Af Amer 23 (*)    GFR calc Af Amer 27 (*)    All other components within normal limits  CBC WITH DIFFERENTIAL/PLATELET - Abnormal; Notable for the following:    RBC 3.18 (*)    Hemoglobin 9.6 (*)    HCT 30.0 (*)    Monocytes Relative 13 (*)    All other components within normal limits  URINALYSIS, ROUTINE W REFLEX MICROSCOPIC - Abnormal; Notable for the following:    pH 8.5 (*)    Leukocytes, UA SMALL (*)    All other components within normal limits  URINE MICROSCOPIC-ADD ON - Abnormal; Notable for the following:    Squamous Epithelial / LPF FEW (*)    Bacteria, UA FEW (*)    All other components within normal limits    Imaging Review Mr Lumbar Spine Wo  Contrast  03/16/2014   CLINICAL DATA:  Twisting injury January 15, 2014. Pain is progressively worsened.  EXAM: MRI LUMBAR SPINE WITHOUT CONTRAST  TECHNIQUE: Multiplanar, multisequence MR imaging of the lumbar spine was performed. No intravenous contrast was administered.  COMPARISON:  None.  FINDINGS: There is transitional anatomy with lumbarization of the S1 vertebral body.  There is a chronic L1 vertebral body kyphoplasty with methylmethacrylate within the vertebral body. There is an L3 vertebral body compression fracture with approximately 30% central height loss with marrow edema and depression of the superior endplate most consistent with a acute-subacute compression fracture. There is mild marrow edema in the inferior half of the L2 vertebral body without significant height loss most concerning for an acute -subacute compression fracture.  The remainder the vertebral body heights are maintained.  There is 3 mm of anterolisthesis of L4 on L5. There is 2 mm of anterolisthesis of L5 on S1. This is likely secondary to facet arthropathy. The marrow signal is otherwise normal. The intervertebral disc spaces are well-maintained.  The spinal cord is normal in signal and contour. The cord terminates normally at L1 . The nerve roots of the cauda equina and the filum terminale are normal.  The visualized portions of the SI joints are unremarkable.  The imaged intra-abdominal contents are unremarkable.  T12-L1: Mild broad-based disc bulge. Mild bilateral facet arthropathy. No evidence of neural foraminal stenosis. No central canal stenosis.  L1-L2: Mild broad-based disc bulge. Mild bilateral facet arthropathy. No evidence of neural foraminal stenosis. Mild spinal stenosis.  L2-L3: No significant disc bulge. No evidence of neural foraminal stenosis. No central canal stenosis.  L3-L4: Mild broad-based disc bulge. Mild bilateral facet arthropathy. Mild spinal stenosis. No evidence of neural foraminal stenosis.  L4-L5:  Mild broad-based disc bulge eccentric towards the right. Severe right and moderate left facet arthropathy with ligamentum flavum infolding resulting in right lateral recess stenosis and mild spinal stenosis. No evidence of neural foraminal stenosis.  L5-S1: Mild broad-based disc bulge. Moderate bilateral facet arthropathy. No right foraminal stenosis. Mild left foraminal stenosis. No central canal stenosis.  IMPRESSION: 1. Acute-subacute L3 vertebral body compression fracture with approximately 30% central height loss. 2. Acute - subacute L2 vertebral body fracture along the inferior endplate without significant height loss. 3. At L4-5 there is a mild broad-based disc bulge eccentric towards the  right. Severe right and moderate left facet arthropathy with ligamentum flavum infolding resulting in right lateral recess stenosis and mild spinal stenosis. 4. Lumbar spine spondylosis as described above.   Electronically Signed   By: Kathreen Devoid   On: 03/16/2014 16:17    Medications  morphine 2 MG/ML injection 2 mg (2 mg Intravenous Given 03/16/14 1339)  morphine 2 MG/ML injection 2 mg (2 mg Intravenous Given 03/16/14 1456)     MDM   Final diagnoses:  Back pain  Lumbar compression fracture, closed, initial encounter  Renal insufficiency  Anemia, unspecified anemia type    Nursing notes including past medical history and social history reviewed and considered in documentation Labs/vital reviewed myself and considered during evaluation   I personally performed the services described in this documentation, which was scribed in my presence. The recorded information has been reviewed and is accurate.      Sharyon Cable, MD 03/16/14 [phone]

## 2014-03-16 NOTE — ED Notes (Signed)
Daughter reports Dec 15, pt turned over in bed and hurt something in lower back.  Reports took a course of prednisone and improved a little bit.  Reports had home health and PT x 4 weeks.  Reports she was doing better and went out Sunday.  Pain started getting worse Monday and radiates down both legs.

## 2014-03-24 ENCOUNTER — Encounter (HOSPITAL_COMMUNITY)
Admission: RE | Admit: 2014-03-24 | Discharge: 2014-03-24 | Disposition: A | Discharge status: Home / Self Care | Payer: Medicare Other | Source: Ambulatory Visit | Attending: Nephrology | Admitting: Nephrology

## 2014-03-24 DIAGNOSIS — D631 Anemia in chronic kidney disease: Secondary | ICD-10-CM | POA: Diagnosis not present

## 2014-03-24 DIAGNOSIS — N183 Chronic kidney disease, stage 3 (moderate): Secondary | ICD-10-CM | POA: Diagnosis not present

## 2014-03-24 MED ORDER — DARBEPOETIN ALFA 100 MCG/0.5ML IJ SOSY
PREFILLED_SYRINGE | INTRAMUSCULAR | Status: AC
  Filled 2014-03-24: qty 0.5

## 2014-03-24 MED ORDER — DARBEPOETIN ALFA 100 MCG/0.5ML IJ SOSY
100.0000 ug | PREFILLED_SYRINGE | INTRAMUSCULAR | Status: DC
  Administered 2014-03-24: 100 ug via SUBCUTANEOUS

## 2014-03-24 NOTE — Progress Notes (Signed)
Pt arrived to Short Stay for scheduled dose aranesp, but due to fx vertebra very difficult to get out of car. Labs drawn on 2/18 so not redrawn. aranesp 100 mcg in right deltoid while pt in car.

## 2014-03-31 DIAGNOSIS — R300 Dysuria: Secondary | ICD-10-CM | POA: Diagnosis not present

## 2014-04-03 DIAGNOSIS — I129 Hypertensive chronic kidney disease with stage 1 through stage 4 chronic kidney disease, or unspecified chronic kidney disease: Secondary | ICD-10-CM | POA: Diagnosis not present

## 2014-04-03 DIAGNOSIS — S32029D Unspecified fracture of second lumbar vertebra, subsequent encounter for fracture with routine healing: Secondary | ICD-10-CM | POA: Diagnosis not present

## 2014-04-03 DIAGNOSIS — D649 Anemia, unspecified: Secondary | ICD-10-CM | POA: Diagnosis not present

## 2014-04-03 DIAGNOSIS — Z9181 History of falling: Secondary | ICD-10-CM | POA: Diagnosis not present

## 2014-04-03 DIAGNOSIS — J449 Chronic obstructive pulmonary disease, unspecified: Secondary | ICD-10-CM | POA: Diagnosis not present

## 2014-04-03 DIAGNOSIS — N189 Chronic kidney disease, unspecified: Secondary | ICD-10-CM | POA: Diagnosis not present

## 2014-04-03 DIAGNOSIS — M5416 Radiculopathy, lumbar region: Secondary | ICD-10-CM | POA: Diagnosis not present

## 2014-04-03 DIAGNOSIS — S32039D Unspecified fracture of third lumbar vertebra, subsequent encounter for fracture with routine healing: Secondary | ICD-10-CM | POA: Diagnosis not present

## 2014-04-06 DIAGNOSIS — M5416 Radiculopathy, lumbar region: Secondary | ICD-10-CM | POA: Diagnosis not present

## 2014-04-06 DIAGNOSIS — S32039D Unspecified fracture of third lumbar vertebra, subsequent encounter for fracture with routine healing: Secondary | ICD-10-CM | POA: Diagnosis not present

## 2014-04-06 DIAGNOSIS — J449 Chronic obstructive pulmonary disease, unspecified: Secondary | ICD-10-CM | POA: Diagnosis not present

## 2014-04-06 DIAGNOSIS — I129 Hypertensive chronic kidney disease with stage 1 through stage 4 chronic kidney disease, or unspecified chronic kidney disease: Secondary | ICD-10-CM | POA: Diagnosis not present

## 2014-04-06 DIAGNOSIS — N189 Chronic kidney disease, unspecified: Secondary | ICD-10-CM | POA: Diagnosis not present

## 2014-04-06 DIAGNOSIS — S32029D Unspecified fracture of second lumbar vertebra, subsequent encounter for fracture with routine healing: Secondary | ICD-10-CM | POA: Diagnosis not present

## 2014-04-07 DIAGNOSIS — S32029D Unspecified fracture of second lumbar vertebra, subsequent encounter for fracture with routine healing: Secondary | ICD-10-CM | POA: Diagnosis not present

## 2014-04-07 DIAGNOSIS — N189 Chronic kidney disease, unspecified: Secondary | ICD-10-CM | POA: Diagnosis not present

## 2014-04-07 DIAGNOSIS — J449 Chronic obstructive pulmonary disease, unspecified: Secondary | ICD-10-CM | POA: Diagnosis not present

## 2014-04-07 DIAGNOSIS — S32039D Unspecified fracture of third lumbar vertebra, subsequent encounter for fracture with routine healing: Secondary | ICD-10-CM | POA: Diagnosis not present

## 2014-04-07 DIAGNOSIS — M5416 Radiculopathy, lumbar region: Secondary | ICD-10-CM | POA: Diagnosis not present

## 2014-04-07 DIAGNOSIS — I129 Hypertensive chronic kidney disease with stage 1 through stage 4 chronic kidney disease, or unspecified chronic kidney disease: Secondary | ICD-10-CM | POA: Diagnosis not present

## 2014-04-10 DIAGNOSIS — J449 Chronic obstructive pulmonary disease, unspecified: Secondary | ICD-10-CM | POA: Diagnosis not present

## 2014-04-10 DIAGNOSIS — M5416 Radiculopathy, lumbar region: Secondary | ICD-10-CM | POA: Diagnosis not present

## 2014-04-10 DIAGNOSIS — I129 Hypertensive chronic kidney disease with stage 1 through stage 4 chronic kidney disease, or unspecified chronic kidney disease: Secondary | ICD-10-CM | POA: Diagnosis not present

## 2014-04-10 DIAGNOSIS — S32029D Unspecified fracture of second lumbar vertebra, subsequent encounter for fracture with routine healing: Secondary | ICD-10-CM | POA: Diagnosis not present

## 2014-04-10 DIAGNOSIS — S32039D Unspecified fracture of third lumbar vertebra, subsequent encounter for fracture with routine healing: Secondary | ICD-10-CM | POA: Diagnosis not present

## 2014-04-10 DIAGNOSIS — N189 Chronic kidney disease, unspecified: Secondary | ICD-10-CM | POA: Diagnosis not present

## 2014-04-11 DIAGNOSIS — N189 Chronic kidney disease, unspecified: Secondary | ICD-10-CM | POA: Diagnosis not present

## 2014-04-11 DIAGNOSIS — S32029D Unspecified fracture of second lumbar vertebra, subsequent encounter for fracture with routine healing: Secondary | ICD-10-CM | POA: Diagnosis not present

## 2014-04-11 DIAGNOSIS — M5416 Radiculopathy, lumbar region: Secondary | ICD-10-CM | POA: Diagnosis not present

## 2014-04-11 DIAGNOSIS — S32039D Unspecified fracture of third lumbar vertebra, subsequent encounter for fracture with routine healing: Secondary | ICD-10-CM | POA: Diagnosis not present

## 2014-04-11 DIAGNOSIS — J449 Chronic obstructive pulmonary disease, unspecified: Secondary | ICD-10-CM | POA: Diagnosis not present

## 2014-04-11 DIAGNOSIS — I129 Hypertensive chronic kidney disease with stage 1 through stage 4 chronic kidney disease, or unspecified chronic kidney disease: Secondary | ICD-10-CM | POA: Diagnosis not present

## 2014-04-14 DIAGNOSIS — J449 Chronic obstructive pulmonary disease, unspecified: Secondary | ICD-10-CM | POA: Diagnosis not present

## 2014-04-14 DIAGNOSIS — I129 Hypertensive chronic kidney disease with stage 1 through stage 4 chronic kidney disease, or unspecified chronic kidney disease: Secondary | ICD-10-CM | POA: Diagnosis not present

## 2014-04-14 DIAGNOSIS — N189 Chronic kidney disease, unspecified: Secondary | ICD-10-CM | POA: Diagnosis not present

## 2014-04-14 DIAGNOSIS — S32039D Unspecified fracture of third lumbar vertebra, subsequent encounter for fracture with routine healing: Secondary | ICD-10-CM | POA: Diagnosis not present

## 2014-04-14 DIAGNOSIS — S32029D Unspecified fracture of second lumbar vertebra, subsequent encounter for fracture with routine healing: Secondary | ICD-10-CM | POA: Diagnosis not present

## 2014-04-14 DIAGNOSIS — M5416 Radiculopathy, lumbar region: Secondary | ICD-10-CM | POA: Diagnosis not present

## 2014-04-17 DIAGNOSIS — M5416 Radiculopathy, lumbar region: Secondary | ICD-10-CM | POA: Diagnosis not present

## 2014-04-17 DIAGNOSIS — S32039D Unspecified fracture of third lumbar vertebra, subsequent encounter for fracture with routine healing: Secondary | ICD-10-CM | POA: Diagnosis not present

## 2014-04-17 DIAGNOSIS — S32029D Unspecified fracture of second lumbar vertebra, subsequent encounter for fracture with routine healing: Secondary | ICD-10-CM | POA: Diagnosis not present

## 2014-04-17 DIAGNOSIS — J449 Chronic obstructive pulmonary disease, unspecified: Secondary | ICD-10-CM | POA: Diagnosis not present

## 2014-04-17 DIAGNOSIS — I129 Hypertensive chronic kidney disease with stage 1 through stage 4 chronic kidney disease, or unspecified chronic kidney disease: Secondary | ICD-10-CM | POA: Diagnosis not present

## 2014-04-17 DIAGNOSIS — N189 Chronic kidney disease, unspecified: Secondary | ICD-10-CM | POA: Diagnosis not present

## 2014-04-19 DIAGNOSIS — I129 Hypertensive chronic kidney disease with stage 1 through stage 4 chronic kidney disease, or unspecified chronic kidney disease: Secondary | ICD-10-CM | POA: Diagnosis not present

## 2014-04-19 DIAGNOSIS — S32039D Unspecified fracture of third lumbar vertebra, subsequent encounter for fracture with routine healing: Secondary | ICD-10-CM | POA: Diagnosis not present

## 2014-04-19 DIAGNOSIS — N189 Chronic kidney disease, unspecified: Secondary | ICD-10-CM | POA: Diagnosis not present

## 2014-04-19 DIAGNOSIS — M5416 Radiculopathy, lumbar region: Secondary | ICD-10-CM | POA: Diagnosis not present

## 2014-04-19 DIAGNOSIS — J449 Chronic obstructive pulmonary disease, unspecified: Secondary | ICD-10-CM | POA: Diagnosis not present

## 2014-04-19 DIAGNOSIS — S32029D Unspecified fracture of second lumbar vertebra, subsequent encounter for fracture with routine healing: Secondary | ICD-10-CM | POA: Diagnosis not present

## 2014-04-26 DIAGNOSIS — I129 Hypertensive chronic kidney disease with stage 1 through stage 4 chronic kidney disease, or unspecified chronic kidney disease: Secondary | ICD-10-CM | POA: Diagnosis not present

## 2014-04-26 DIAGNOSIS — J449 Chronic obstructive pulmonary disease, unspecified: Secondary | ICD-10-CM | POA: Diagnosis not present

## 2014-04-26 DIAGNOSIS — M5416 Radiculopathy, lumbar region: Secondary | ICD-10-CM | POA: Diagnosis not present

## 2014-04-26 DIAGNOSIS — N189 Chronic kidney disease, unspecified: Secondary | ICD-10-CM | POA: Diagnosis not present

## 2014-04-26 DIAGNOSIS — S32029D Unspecified fracture of second lumbar vertebra, subsequent encounter for fracture with routine healing: Secondary | ICD-10-CM | POA: Diagnosis not present

## 2014-04-26 DIAGNOSIS — S32039D Unspecified fracture of third lumbar vertebra, subsequent encounter for fracture with routine healing: Secondary | ICD-10-CM | POA: Diagnosis not present

## 2014-04-27 DIAGNOSIS — I129 Hypertensive chronic kidney disease with stage 1 through stage 4 chronic kidney disease, or unspecified chronic kidney disease: Secondary | ICD-10-CM | POA: Diagnosis not present

## 2014-04-27 DIAGNOSIS — S32039D Unspecified fracture of third lumbar vertebra, subsequent encounter for fracture with routine healing: Secondary | ICD-10-CM | POA: Diagnosis not present

## 2014-04-27 DIAGNOSIS — M5416 Radiculopathy, lumbar region: Secondary | ICD-10-CM | POA: Diagnosis not present

## 2014-04-27 DIAGNOSIS — N189 Chronic kidney disease, unspecified: Secondary | ICD-10-CM | POA: Diagnosis not present

## 2014-04-27 DIAGNOSIS — S32029D Unspecified fracture of second lumbar vertebra, subsequent encounter for fracture with routine healing: Secondary | ICD-10-CM | POA: Diagnosis not present

## 2014-04-27 DIAGNOSIS — J449 Chronic obstructive pulmonary disease, unspecified: Secondary | ICD-10-CM | POA: Diagnosis not present

## 2014-05-01 DIAGNOSIS — H35052 Retinal neovascularization, unspecified, left eye: Secondary | ICD-10-CM | POA: Diagnosis not present

## 2014-05-01 DIAGNOSIS — H3532 Exudative age-related macular degeneration: Secondary | ICD-10-CM | POA: Diagnosis not present

## 2014-05-02 DIAGNOSIS — N189 Chronic kidney disease, unspecified: Secondary | ICD-10-CM | POA: Diagnosis not present

## 2014-05-02 DIAGNOSIS — M5416 Radiculopathy, lumbar region: Secondary | ICD-10-CM | POA: Diagnosis not present

## 2014-05-02 DIAGNOSIS — J449 Chronic obstructive pulmonary disease, unspecified: Secondary | ICD-10-CM | POA: Diagnosis not present

## 2014-05-02 DIAGNOSIS — S32039D Unspecified fracture of third lumbar vertebra, subsequent encounter for fracture with routine healing: Secondary | ICD-10-CM | POA: Diagnosis not present

## 2014-05-02 DIAGNOSIS — I129 Hypertensive chronic kidney disease with stage 1 through stage 4 chronic kidney disease, or unspecified chronic kidney disease: Secondary | ICD-10-CM | POA: Diagnosis not present

## 2014-05-02 DIAGNOSIS — S32029D Unspecified fracture of second lumbar vertebra, subsequent encounter for fracture with routine healing: Secondary | ICD-10-CM | POA: Diagnosis not present

## 2014-05-04 DIAGNOSIS — N189 Chronic kidney disease, unspecified: Secondary | ICD-10-CM | POA: Diagnosis not present

## 2014-05-04 DIAGNOSIS — S32039D Unspecified fracture of third lumbar vertebra, subsequent encounter for fracture with routine healing: Secondary | ICD-10-CM | POA: Diagnosis not present

## 2014-05-04 DIAGNOSIS — J449 Chronic obstructive pulmonary disease, unspecified: Secondary | ICD-10-CM | POA: Diagnosis not present

## 2014-05-04 DIAGNOSIS — M5416 Radiculopathy, lumbar region: Secondary | ICD-10-CM | POA: Diagnosis not present

## 2014-05-04 DIAGNOSIS — I129 Hypertensive chronic kidney disease with stage 1 through stage 4 chronic kidney disease, or unspecified chronic kidney disease: Secondary | ICD-10-CM | POA: Diagnosis not present

## 2014-05-04 DIAGNOSIS — S32029D Unspecified fracture of second lumbar vertebra, subsequent encounter for fracture with routine healing: Secondary | ICD-10-CM | POA: Diagnosis not present

## 2014-05-08 ENCOUNTER — Encounter: Payer: Self-pay | Admitting: Adult Health

## 2014-05-08 ENCOUNTER — Ambulatory Visit (INDEPENDENT_AMBULATORY_CARE_PROVIDER_SITE_OTHER): Payer: Medicare Other | Admitting: Adult Health

## 2014-05-08 VITALS — BP 140/70 | HR 65 | Ht 59.0 in | Wt 106.4 lb

## 2014-05-08 DIAGNOSIS — N912 Amenorrhea, unspecified: Secondary | ICD-10-CM | POA: Diagnosis not present

## 2014-05-08 DIAGNOSIS — I359 Nonrheumatic aortic valve disorder, unspecified: Secondary | ICD-10-CM

## 2014-05-08 DIAGNOSIS — Z136 Encounter for screening for cardiovascular disorders: Secondary | ICD-10-CM

## 2014-05-08 DIAGNOSIS — R0609 Other forms of dyspnea: Secondary | ICD-10-CM

## 2014-05-08 MED ORDER — AMLODIPINE BESYLATE 5 MG PO TABS: 5.0000 mg | ORAL_TABLET | Freq: Every day | ORAL | Status: DC

## 2014-05-08 MED ORDER — METOPROLOL SUCCINATE ER 50 MG PO TB24: 75.0000 mg | ORAL_TABLET | Freq: Every day | ORAL | Status: DC

## 2014-05-08 NOTE — Patient Instructions (Signed)
Your physician recommends that you schedule a follow-up appointment in: 1 month with Dr. Harl Bowie  Your physician recommends that you return for lab work in: [place] has recommended you make the following change in your medication:  Decrease Amlodipine to 5 mg Daily  Increase Toprol to 75 mg Daily  Thank you for choosing [facility]!

## 2014-05-08 NOTE — Progress Notes (Deleted)
Name: [name] [name]    DOB: 1914-04-17  Age: 81 y.o.  MR#: [id number]       PCP:  Delphina Cahill, MD      Insurance: Payor: MEDICARE / Plan: MEDICARE PART A AND B / Product Type: *No Product type* /   CC:   No chief complaint on file.   VS Filed Vitals:   05/08/14 1444  BP: 140/70  Pulse: 65  Height: 4\' 11"  (1.499 m)  Weight: 106 lb 6.4 oz (48.263 kg)  SpO2: 98%    Weights Current Weight  05/08/14 106 lb 6.4 oz (48.263 kg)  03/16/14 101 lb (45.813 kg)  03/16/14 101 lb (45.813 kg)    Blood Pressure  BP Readings from Last 3 Encounters:  05/08/14 140/70  03/16/14 183/72  01/13/14 172/63     Admit date:  (Not on file) Last encounter with RMR:  12/12/2013   Allergy Neosporin; Nitrofurantoin monohyd macro; and Penicillins  Current Outpatient Prescriptions  Medication Sig Dispense Refill  . acetaminophen (TYLENOL) 500 MG tablet Take 1,000 mg by mouth every 6 (six) hours as needed for moderate pain.     Marland Kitchen amLODipine (NORVASC) 10 MG tablet Take 10 mg by mouth daily.    Marland Kitchen aspirin 81 MG tablet Take 81 mg by mouth daily.      . darbepoetin alfa-polysorbate (ARANESP, ALBUMIN FREE,) 100 MCG/ML injection Inject 1 mL (100 mcg total) into the skin once. 0.4 mL 0  . docusate sodium (COLACE) 100 MG capsule Take 100 mg by mouth 2 (two) times daily.      . hydrochlorothiazide (MICROZIDE) 12.5 MG capsule Take 12.5 mg by mouth daily.    Marland Kitchen latanoprost (XALATAN) 0.005 % ophthalmic solution Place 1 drop into both eyes at bedtime.     Marland Kitchen levothyroxine (SYNTHROID, LEVOTHROID) 50 MCG tablet Take 50 mcg by mouth daily.      . metoprolol succinate (TOPROL-XL) 50 MG 24 hr tablet Take 50 mg by mouth at bedtime.     . Multiple Vitamins-Minerals (MULTIVITAMIN WITH MINERALS) tablet Take 1 tablet by mouth daily.      . Multiple Vitamins-Minerals (VISION FORMULA PO) Take 1 tablet by mouth daily.      Marland Kitchen omeprazole (PRILOSEC) 20 MG capsule TAKE (2) CAPSULES BY MOUTH ONCE DAILY. (Patient taking differently: TAKE  1-2 CAPSULES BY MOUTH ONCE DAILY.) 60 capsule 3  . polyethylene glycol (MIRALAX / GLYCOLAX) packet Take 17 g by mouth at bedtime.     . predniSONE (DELTASONE) 5 MG tablet Take 1 tablet by mouth daily.    . sertraline (ZOLOFT) 50 MG tablet Take 100 mg by mouth daily.     . traMADol (ULTRAM) 50 MG tablet Take 50 mg by mouth every 6 (six) hours as needed for moderate pain.     Marland Kitchen ZOSTAVAX 58099 UNT/0.65ML injection Inject 0.65 mLs into the skin once.      No current facility-administered medications for this visit.   Facility-Administered Medications Ordered in Other Visits  Medication Dose Route Frequency Provider Last Rate Last Dose  . darbepoetin (ARANESP) injection 100 mcg  100 mcg Subcutaneous Once Fran Lowes, MD        Discontinued Meds:   There are no discontinued medications.  Patient Active Problem List   Diagnosis Date Noted  . CAROTID ARTERY STENOSIS, WITHOUT INFARCTION 12/22/2008  . HYPERTHYROIDISM 12/19/2008  . ANEMIA 12/19/2008  . HYPERTENSION 12/19/2008  . AORTIC VALVE DISEASE 12/19/2008  . RENAL INSUFFICIENCY 12/19/2008  . BACK PAIN 12/19/2008  .  DIZZINESS 12/19/2008    LABS    Component Value Date/Time   NA 138 03/16/2014 1338   NA 143 01/13/2014 1105   NA 139 10/13/2013 1350   K 4.3 03/16/2014 1338   K 3.6* 01/13/2014 1105   K 4.0 10/13/2013 1350   CL 100 03/16/2014 1338   CL 101 01/13/2014 1105   CL 98 10/13/2013 1350   CO2 31 03/16/2014 1338   CO2 30 01/13/2014 1105   CO2 31 10/13/2013 1350   GLUCOSE 90 03/16/2014 1338   GLUCOSE 97 01/13/2014 1105   GLUCOSE 119* 10/13/2013 1350   BUN 28* 03/16/2014 1338   BUN 24* 01/13/2014 1105   BUN 29* 10/13/2013 1350   CREATININE 1.74* 03/16/2014 1338   CREATININE 1.81* 01/13/2014 1105   CREATININE 1.82* 10/13/2013 1350   CREATININE 1.60* 04/01/2011 1035   CALCIUM 9.3 03/16/2014 1338   CALCIUM 10.0 01/13/2014 1105   CALCIUM 9.6 10/13/2013 1350   CALCIUM 9.6 08/05/2011 1136   GFRNONAA 23* 03/16/2014  1338   GFRNONAA 22* 01/13/2014 1105   GFRNONAA 22* 10/13/2013 1350   GFRAA 27* 03/16/2014 1338   GFRAA 26* 01/13/2014 1105   GFRAA 26* 10/13/2013 1350   CMP     Component Value Date/Time   NA 138 03/16/2014 1338   K 4.3 03/16/2014 1338   CL 100 03/16/2014 1338   CO2 31 03/16/2014 1338   GLUCOSE 90 03/16/2014 1338   BUN 28* 03/16/2014 1338   CREATININE 1.74* 03/16/2014 1338   CREATININE 1.60* 04/01/2011 1035   CALCIUM 9.3 03/16/2014 1338   CALCIUM 9.6 08/05/2011 1136   PROT 7.4 06/22/2010 1303   ALBUMIN 3.5 01/13/2014 1105   AST 23 06/22/2010 1303   ALT 12 06/22/2010 1303   ALKPHOS 68 06/22/2010 1303   BILITOT 0.3 06/22/2010 1303   GFRNONAA 23* 03/16/2014 1338   GFRAA 27* 03/16/2014 1338       Component Value Date/Time   WBC 7.7 03/16/2014 1338   WBC 6.4 04/26/2012 0004   WBC 7.7 06/22/2010 1303   HGB 9.6* 03/16/2014 1338   HGB 9.4* 01/13/2014 1105   HGB 10.2* 10/13/2013 1350   HCT 30.0* 03/16/2014 1338   HCT 28.9* 01/13/2014 1105   HCT 31.1* 10/13/2013 1350   MCV 94.3 03/16/2014 1338   MCV 95.6 04/26/2012 0004   MCV 91.8 06/22/2010 1303    Lipid Panel  No results found for: CHOL, TRIG, HDL, CHOLHDL, VLDL, LDLCALC, LDLDIRECT  ABG No results found for: PHART, PCO2ART, PO2ART, HCO3, TCO2, ACIDBASEDEF, O2SAT   Lab Results  Component Value Date   TSH 3.277 ***Test methodology is 3rd generation TSH*** 03/08/2008   BNP (last 3 results) No results for input(s): BNP in the last 8760 hours.  ProBNP (last 3 results) No results for input(s): PROBNP in the last 8760 hours.  Cardiac Panel (last 3 results) No results for input(s): CKTOTAL, CKMB, TROPONINI, RELINDX in the last 72 hours.  Iron/TIBC/Ferritin/ %Sat No results found for: IRON, TIBC, FERRITIN, IRONPCTSAT   EKG Orders placed or performed in visit on 12/01/12  . EKG 12-Lead     Prior Assessment and Plan Problem List as of 05/08/2014      Cardiovascular and Mediastinum   HYPERTENSION   Last  Assessment & Plan 09/09/2011 Office Visit Written 09/09/2011 10:51 AM by Lendon Colonel, NP    Blood pressure is well-controlled. No changes in her medication regimen at this time. She has all medications provided by her PCP with refills. She is  to call us sooner she becomes symptomatic.      AORTIC VALVE DISEASE   Last Assessment & Plan 09/09/2011 Office Visit Written 09/09/2011 10:50 AM by Lendon Colonel, NP    She is doing very well for her age and diagnosis. She is asymptomatic. We will make no changes on her medications at this time. She will followup with Dr. Verl Blalock in one year unless she becomes symptomatic.      CAROTID ARTERY STENOSIS, WITHOUT INFARCTION   Last Assessment & Plan 07/23/2010 Office Visit Written 07/23/2010 12:01 PM by Renella Cunas, MD    Stable, continue medical therapy        Endocrine   HYPERTHYROIDISM     Genitourinary   RENAL INSUFFICIENCY   Last Assessment & Plan 09/09/2011 Office Visit Written 09/09/2011 10:51 AM by Lendon Colonel, NP    I have reviewed labs completed one month ago. Continue to monitor closely.        Other   ANEMIA   Last Assessment & Plan 07/09/2010 Office Visit Written 07/09/2010  5:18 PM by Lendon Colonel, NP    I will repeat CBC to evaluate Hgb status in the setting of increased shortness of breath.  Copy to be sent to Dr. Hinda Lenis.      BACK PAIN   DIZZINESS       Imaging: No results found.

## 2014-05-08 NOTE — Progress Notes (Signed)
Cardiology Office Note   Date:  05/08/2014   ID:  [name] [name], DOB 1914-10-23, MRN [id number]  PCP:  Delphina Cahill, MD  Cardiologist:  Cloria Spring, NP   No chief complaint on file.     History of Present Illness: [name] [name] is a 81 y.o. female who presents for who presents for and SSN management. Aortic valve stenosis, with most recent echocardiogram completed in 2015 showing moderate stenosis. Also, hypertension. The patient was last seen in the office in 07/12/2013, continued to have stable dyspnea on exertion, and moderate levels, she is a poor candidate for any form of intervention given age. Blood pressure is well-controlled.  She comes today with worsening complaints of dyspnea. She is noticing it just getting dressed in the morning. She lives with her daughter and is in an upstairs area. Climbing the stairs with help is exhausting for her. She states that her energy level is worsening. She has a PT visit her and is doing fair with the exercise. She had a  Compression fracture in her back and is now using a walker to ambulate.     Past Medical History  Diagnosis Date  . Backache, unspecified   . Dizziness and giddiness   . Thyrotoxicosis without mention of goiter or other cause, without mention of thyrotoxic crisis or storm   . Unspecified disorder resulting from impaired renal function   . Anemia, unspecified   . Unspecified essential hypertension   . COPD (chronic obstructive pulmonary disease)     Past Surgical History  Procedure Laterality Date  . Small bowel obst    . Salivary gland surgery Left   . Back surgery      Kyphoplasty     Current Outpatient Prescriptions  Medication Sig Dispense Refill  . acetaminophen (TYLENOL) 500 MG tablet Take 1,000 mg by mouth every 6 (six) hours as needed for moderate pain.     Marland Kitchen amLODipine (NORVASC) 10 MG tablet Take 10 mg by mouth daily.    Marland Kitchen aspirin 81 MG tablet Take 81 mg by mouth daily.      . darbepoetin  alfa-polysorbate (ARANESP, ALBUMIN FREE,) 100 MCG/ML injection Inject 1 mL (100 mcg total) into the skin once. 0.4 mL 0  . docusate sodium (COLACE) 100 MG capsule Take 100 mg by mouth 2 (two) times daily.      . hydrochlorothiazide (MICROZIDE) 12.5 MG capsule Take 12.5 mg by mouth daily.    Marland Kitchen latanoprost (XALATAN) 0.005 % ophthalmic solution Place 1 drop into both eyes at bedtime.     Marland Kitchen levothyroxine (SYNTHROID, LEVOTHROID) 50 MCG tablet Take 50 mcg by mouth daily.      . metoprolol succinate (TOPROL-XL) 50 MG 24 hr tablet Take 50 mg by mouth at bedtime.     . Multiple Vitamins-Minerals (MULTIVITAMIN WITH MINERALS) tablet Take 1 tablet by mouth daily.      . Multiple Vitamins-Minerals (VISION FORMULA PO) Take 1 tablet by mouth daily.      Marland Kitchen omeprazole (PRILOSEC) 20 MG capsule TAKE (2) CAPSULES BY MOUTH ONCE DAILY. (Patient taking differently: TAKE 1-2 CAPSULES BY MOUTH ONCE DAILY.) 60 capsule 3  . polyethylene glycol (MIRALAX / GLYCOLAX) packet Take 17 g by mouth at bedtime.     . predniSONE (DELTASONE) 5 MG tablet Take 1 tablet by mouth daily.    . sertraline (ZOLOFT) 50 MG tablet Take 100 mg by mouth daily.     . traMADol (ULTRAM) 50 MG tablet Take 50 mg by  mouth every 6 (six) hours as needed for moderate pain.     Marland Kitchen ZOSTAVAX [id number] UNT/0.65ML injection Inject 0.65 mLs into the skin once.      No current facility-administered medications for this visit.   Facility-Administered Medications Ordered in Other Visits  Medication Dose Route Frequency Provider Last Rate Last Dose  . darbepoetin (ARANESP) injection 100 mcg  100 mcg Subcutaneous Once Fran Lowes, MD        Allergies:   Neosporin; Nitrofurantoin monohyd macro; and Penicillins    Social History:  The patient  reports that she has never smoked. She has never used smokeless tobacco. She reports that she does not drink alcohol or use illicit drugs.   Family History:  The patient's family history is not on file.    ROS: .   All  other systems are reviewed and negative.Unless otherwise mentioned in  H&P above.   PHYSICAL EXAM: VS:  There were no vitals taken for this visit. , BMI There is no weight on file to calculate BMI. GEN: Well nourished, well developed, in no acute distress HEENT: normal Neck: no JVD, carotid bruits, or masses Cardiac: RRR; holosystolic murmurs, no rubs, or gallops,no edema  Respiratory:  clear to auscultation bilaterally, normal work of breathing GI: soft, nontender, nondistended, + BS MS: no deformity or atrophyKyphosis is noted.  Skin: warm and dry, no rash Neuro:  Strength and sensation are intact Psych: euthymic mood, full affect   EKG:  EKGThe ekg ordered today demonstrates NSR with LVH and non-specific ST T wave abnormality inferior lateral   Recent Labs: 03/16/2014: BUN 28*; Creatinine 1.74*; Hemoglobin 9.6*; Platelets 332; Potassium 4.3; Sodium 138    Lipid Panel No results found for: CHOL, TRIG, HDL, CHOLHDL, VLDL, LDLCALC, LDLDIRECT    Wt Readings from Last 3 Encounters:  03/16/14 101 lb (45.813 kg)  03/16/14 101 lb (45.813 kg)  07/12/13 103 lb (46.72 kg)      Other studies Reviewed: Additional studies/ records that were reviewed today include: mod to severe AoV stenosis.     ASSESSMENT AND PLAN:  1. Dyspnea: Multifactorial. Deconditioning and AoV disease. I will increase metoprolol. To 75 mg at HS and decrease the amlodipine to 5 mg to assist with lower HR in the setting of AoV stenosis to help with breathing and chest discomfort. She is to have her O2 sat completed during PT to ascertain if she may need O2 supplement at home. Will see her back in one month. She may need further adjustment in her medications. Check CBC in the setting of anemia and dyspnea.   2.Hypertension:Low normal today. May need to discontinue HCTZ on next visit if she continues to be slightly hypotensive.    Current medicines are reviewed at length with the patient today.    Labs/ tests  ordered today include: CBC. No orders of the defined types were placed in this encounter.     Disposition:   FU with one month with Dr. Harl Bowie  Signed, Jory Sims, NP  05/08/2014 7:31 AM    [facility] [address] [place], [place] [address] Phone: [phone]; Fax: [phone]

## 2014-05-09 NOTE — Addendum Note (Signed)
Addended by: Levonne Hubert on: 05/09/2014 12:38 PM   Modules accepted: Orders

## 2014-05-15 DIAGNOSIS — N912 Amenorrhea, unspecified: Secondary | ICD-10-CM | POA: Diagnosis not present

## 2014-06-02 ENCOUNTER — Encounter (HOSPITAL_COMMUNITY)
Admission: RE | Admit: 2014-06-02 | Discharge: 2014-06-02 | Disposition: A | Discharge status: Home / Self Care | Payer: Medicare Other | Source: Ambulatory Visit | Attending: Nephrology | Admitting: Nephrology

## 2014-06-02 DIAGNOSIS — N183 Chronic kidney disease, stage 3 (moderate): Secondary | ICD-10-CM | POA: Insufficient documentation

## 2014-06-02 DIAGNOSIS — D631 Anemia in chronic kidney disease: Secondary | ICD-10-CM | POA: Diagnosis not present

## 2014-06-02 LAB — RENAL FUNCTION PANEL
ALBUMIN: 3.5 g/dL (ref 3.5–5.0)
ANION GAP: 8 (ref 5–15)
BUN: 24 mg/dL — AB (ref 6–20)
CHLORIDE: 99 mmol/L — AB (ref 101–111)
CO2: 31 mmol/L (ref 22–32)
Calcium: 9.5 mg/dL (ref 8.9–10.3)
Creatinine, Ser: 1.69 mg/dL — ABNORMAL HIGH (ref 0.44–1.00)
GFR calc non Af Amer: 24 mL/min — ABNORMAL LOW (ref >60)
GFR, EST AFRICAN AMERICAN: 28 mL/min — AB (ref >60)
Glucose, Bld: 83 mg/dL (ref 70–99)
Phosphorus: 3.6 mg/dL (ref 2.5–4.6)
Potassium: 4 mmol/L (ref 3.5–5.1)
Sodium: 138 mmol/L (ref 135–145)

## 2014-06-02 LAB — HEMOGLOBIN AND HEMATOCRIT, BLOOD
HEMATOCRIT: 29.5 % — AB (ref 36.0–46.0)
Hemoglobin: 9.5 g/dL — ABNORMAL LOW (ref 12.0–15.0)

## 2014-06-02 MED ORDER — DARBEPOETIN ALFA 100 MCG/0.5ML IJ SOSY
100.0000 ug | PREFILLED_SYRINGE | INTRAMUSCULAR | Status: DC
Start: 2014-06-02 — End: 2014-06-03
  Administered 2014-06-02: 100 ug via SUBCUTANEOUS

## 2014-06-02 MED ORDER — DARBEPOETIN ALFA 100 MCG/0.5ML IJ SOSY
PREFILLED_SYRINGE | INTRAMUSCULAR | Status: AC
  Filled 2014-06-02: qty 0.5

## 2014-06-02 NOTE — Progress Notes (Signed)
Results for BLAINE, GUIFFRE (MRN [id number]) as of 06/02/2014 15:26  Ref. Range 06/02/2014 13:27 06/02/2014 13:28  Sodium Latest Ref Range: 135-145 mmol/L  138  Potassium Latest Ref Range: 3.5-5.1 mmol/L  4.0  Chloride Latest Ref Range: 101-111 mmol/L  99 (L)  CO2 Latest Ref Range: 22-32 mmol/L  31  BUN Latest Ref Range: 6-20 mg/dL  24 (H)  Creatinine Latest Ref Range: 0.44-1.00 mg/dL  1.69 (H)  Calcium Latest Ref Range: 8.9-10.3 mg/dL  9.5  EGFR (Non-African Amer.) Latest Ref Range: >60 mL/min  24 (L)  EGFR (African American) Latest Ref Range: >60 mL/min  28 (L)  Glucose Latest Ref Range: 70-99 mg/dL  83  Anion gap Latest Ref Range: 5-15   8  Phosphorus Latest Ref Range: 2.5-4.6 mg/dL  3.6  Albumin Latest Ref Range: 3.5-5.0 g/dL  3.5  Hemoglobin Latest Ref Range: 12.0-15.0 g/dL 9.5 (L)   HCT Latest Ref Range: 36.0-46.0 % 29.5 (L)

## 2014-06-02 NOTE — Progress Notes (Signed)
Here for labs and aranesp if indicated. Dx N18.3, D63.1.

## 2014-06-07 ENCOUNTER — Ambulatory Visit (INDEPENDENT_AMBULATORY_CARE_PROVIDER_SITE_OTHER): Payer: Medicare Other | Admitting: Cardiology

## 2014-06-07 ENCOUNTER — Encounter: Payer: Self-pay | Admitting: Cardiology

## 2014-06-07 VITALS — BP 108/72 | HR 70 | Ht 60.0 in | Wt 107.0 lb

## 2014-06-07 DIAGNOSIS — R0609 Other forms of dyspnea: Secondary | ICD-10-CM | POA: Diagnosis not present

## 2014-06-07 DIAGNOSIS — I10 Essential (primary) hypertension: Secondary | ICD-10-CM

## 2014-06-07 DIAGNOSIS — I359 Nonrheumatic aortic valve disorder, unspecified: Secondary | ICD-10-CM | POA: Diagnosis not present

## 2014-06-07 DIAGNOSIS — I6523 Occlusion and stenosis of bilateral carotid arteries: Secondary | ICD-10-CM

## 2014-06-07 MED ORDER — AMLODIPINE BESYLATE 10 MG PO TABS: 10.0000 mg | ORAL_TABLET | Freq: Every day | ORAL | Status: DC

## 2014-06-07 MED ORDER — METOPROLOL SUCCINATE ER 50 MG PO TB24: 50.0000 mg | ORAL_TABLET | Freq: Every day | ORAL | Status: AC

## 2014-06-07 MED ORDER — NITROGLYCERIN 0.4 MG SL SUBL: 0.4000 mg | SUBLINGUAL_TABLET | SUBLINGUAL | Status: AC | PRN

## 2014-06-07 NOTE — Progress Notes (Signed)
Clinical Summary [name] [name] is a 81 y.o.female seen today for follow up of the following medical problems.   1. Aortic stenosis  - echo 03/2013 AVA 1.18  Mean grad 24 - DOE with mild activities, example washing up in bathroom and getting dressed. No LE edema. Notes occasional chest pain that radiates to back. Some association with eating. Can have some burping associated with these episodes  2. Anemia - she in on aranesp, managed by nephrology  3. HTN  - checks bp at home occasionally, typically 150-160s/60s  - compliant with meds   4. Carotid stenosis  - mild disease on most recent ultrasound  - denies any neurological symptoms  5. Dizziness - notes some orthostatic symptoms since recent increase in her Toprol.    Past Medical History  Diagnosis Date  . Backache, unspecified   . Dizziness and giddiness   . Thyrotoxicosis without mention of goiter or other cause, without mention of thyrotoxic crisis or storm   . Unspecified disorder resulting from impaired renal function   . Anemia, unspecified   . Unspecified essential hypertension   . COPD (chronic obstructive pulmonary disease)      Allergies  Allergen Reactions  . Neosporin [Neomycin-Bacitracin Zn-Polymyx] Rash  . Nitrofurantoin Monohyd Macro Nausea And Vomiting and Other (See Comments)    Fever, chills  . Penicillins Rash     Current Outpatient Prescriptions  Medication Sig Dispense Refill  . acetaminophen (TYLENOL) 500 MG tablet Take 1,000 mg by mouth every 6 (six) hours as needed for moderate pain.     Marland Kitchen amLODipine (NORVASC) 5 MG tablet Take 1 tablet (5 mg total) by mouth daily. 90 tablet 3  . aspirin 81 MG tablet Take 81 mg by mouth daily.      . darbepoetin alfa-polysorbate (ARANESP, ALBUMIN FREE,) 100 MCG/ML injection Inject 1 mL (100 mcg total) into the skin once. 0.4 mL 0  . docusate sodium (COLACE) 100 MG capsule Take 100 mg by mouth 2 (two) times daily.      . hydrochlorothiazide (MICROZIDE)  12.5 MG capsule Take 12.5 mg by mouth daily.    Marland Kitchen latanoprost (XALATAN) 0.005 % ophthalmic solution Place 1 drop into both eyes at bedtime.     Marland Kitchen levothyroxine (SYNTHROID, LEVOTHROID) 50 MCG tablet Take 50 mcg by mouth daily.      . metoprolol succinate (TOPROL-XL) 50 MG 24 hr tablet Take 2 tablets (100 mg total) by mouth daily. Take with or immediately following a meal. 90 tablet 6  . Multiple Vitamins-Minerals (MULTIVITAMIN WITH MINERALS) tablet Take 1 tablet by mouth daily.      . Multiple Vitamins-Minerals (VISION FORMULA PO) Take 1 tablet by mouth daily.      Marland Kitchen omeprazole (PRILOSEC) 20 MG capsule TAKE (2) CAPSULES BY MOUTH ONCE DAILY. (Patient taking differently: TAKE 1-2 CAPSULES BY MOUTH ONCE DAILY.) 60 capsule 3  . polyethylene glycol (MIRALAX / GLYCOLAX) packet Take 17 g by mouth at bedtime.     . predniSONE (DELTASONE) 5 MG tablet Take 1 tablet by mouth daily.    . sertraline (ZOLOFT) 50 MG tablet Take 100 mg by mouth daily.     . traMADol (ULTRAM) 50 MG tablet Take 50 mg by mouth every 6 (six) hours as needed for moderate pain.     Marland Kitchen ZOSTAVAX 55732 UNT/0.65ML injection Inject 0.65 mLs into the skin once.      No current facility-administered medications for this visit.   Facility-Administered Medications Ordered in Other Visits  Medication Dose Route Frequency Provider Last Rate Last Dose  . darbepoetin (ARANESP) injection 100 mcg  100 mcg Subcutaneous Once Fran Lowes, MD         Past Surgical History  Procedure Laterality Date  . Small bowel obst    . Salivary gland surgery Left   . Back surgery      Kyphoplasty     Allergies  Allergen Reactions  . Neosporin [Neomycin-Bacitracin Zn-Polymyx] Rash  . Nitrofurantoin Monohyd Macro Nausea And Vomiting and Other (See Comments)    Fever, chills  . Penicillins Rash      No family history on file.   Social History [name] [name] reports that she has never smoked. She has never used smokeless tobacco. [name] [name]  reports that she does not drink alcohol.   Review of Systems CONSTITUTIONAL: No weight loss, fever, chills, weakness or fatigue.  HEENT: Eyes: No visual loss, blurred vision, double vision or yellow sclerae.No hearing loss, sneezing, congestion, runny nose or sore throat.  SKIN: No rash or itching.  CARDIOVASCULAR: per HPI RESPIRATORY: No cough or sputum.  GASTROINTESTINAL: No anorexia, nausea, vomiting or diarrhea. No abdominal pain or blood.  GENITOURINARY: No burning on urination, no polyuria NEUROLOGICAL: No headache, dizziness, syncope, paralysis, ataxia, numbness or tingling in the extremities. No change in bowel or bladder control.  MUSCULOSKELETAL: No muscle, back pain, joint pain or stiffness.  LYMPHATICS: No enlarged nodes. No history of splenectomy.  PSYCHIATRIC: No history of depression or anxiety.  ENDOCRINOLOGIC: No reports of sweating, cold or heat intolerance. No polyuria or polydipsia.  Marland Kitchen   Physical Examination Filed Vitals:   06/07/14 1326  BP: 108/72  Pulse: 70   Filed Vitals:   06/07/14 1326  Height: 5' (1.524 m)  Weight: 107 lb (48.535 kg)    Gen: resting comfortably, no acute distress HEENT: no scleral icterus, pupils equal round and reactive, no palptable cervical adenopathy,  CV: RRR, 3/6 systolic murmur RUSB, no JVD Resp: Clear to auscultation bilaterally GI: abdomen is soft, non-tender, non-distended, normal bowel sounds, no hepatosplenomegaly MSK: extremities are warm, no edema.  Skin: warm, no rash Neuro:  no focal deficits Psych: appropriate affect   Diagnostic Studies 03/2011 Echo: LVEF 55-60%, mild LVH, no WMAs, grade I diastolic dysfunction, moderate AS (mead grad 13, AVA 1.15 by VTI, dimensionless index 0.45).   03/2011 Renal Artery Duplex: difficult study, normal renal arteries   03/2011 Carotid US:  1. Bilateral carotid bifurcation and proximal ICA plaque, resulting in less than 50% diameter stenosis. Little convincing progression  since previous exam. The exam does not exclude plaque ulceration or embolization. Continued surveillance recommended.  03/2013 echo Study Conclusions  - Left ventricle: The cavity size was normal. Wall thickness was increased in a pattern of mild LVH. Systolic function was normal. The estimated ejection fraction was in the range of 50% to 55%. Although no diagnostic regional wall motion abnormality was identified, this possibility cannot be completely excluded on the basis of this study. Doppler parameters are consistent with abnormal left ventricular relaxation (grade 1 diastolic dysfunction). - Aortic valve: Moderately calcified annulus. Possibly functionally bicuspid; moderately calcified leaflets. Cusp separation was severely reduced. There was moderate to severe stenosis. Mild regurgitation. Mean gradient: 47mm Hg (S). Peak gradient: 65mm Hg (S). LVOT/AV VTI ratio 0.36. Valve area: 1.18cm^2(VTI). - Mitral valve: Calcified annulus. Mildly thickened leaflets . Trivial regurgitation. - Left atrium: The atrium was mildly dilated. - Right atrium: Central venous pressure: 83mm Hg (est). - Atrial septum: No defect  or patent foramen ovale was identified. - Tricuspid valve: Trivial regurgitation. - Pulmonary arteries: PA peak pressure: 80mm Hg (S). - Pericardium, extracardiac: A prominent pericardial fat pad was present. Impressions:  - Mild LVH with LVEF 43-27%, grade 1 diastolic dysfunction. Mild left atrial enlargement. MAC with trivial mitral regurgitation. Moderate to severe aortic stenosis as outlined above. Gradients have increased somewhat compared to prior study. Mild aortic regurgitation. PASP 39 mmHg.  Assessment and Plan   1. Aortic stenosis  - moderate by most recent echo, continue to follow clinically - significant DOE likely multifactorial due to AS, anemia, age, and deconditioning. Reports symptoms often improve somewhat after  aranesp. Reports some occasional wheezing, will given prn albuterol   2. HTN  - above goal. Worsening dizziness with recent increase in Toprol, will decrease and increase her norvasc instead.   3. Carotid stenosis  - continue to follow clinically, no current symptoms   4. Atypical chest pain - possible GI cause. She will try prn NG to see if any benefit    F/u 4 months    Arnoldo Lenis, M.D.

## 2014-06-07 NOTE — Patient Instructions (Signed)
Your physician recommends that you schedule a follow-up appointment in: 4 months with Dr. Harl Bowie  Your physician has recommended you make the following change in your medication:   Decrease   Toprol XL to 50 mg Daily  Increase   Norvasc to 10 mg Daily   Start Nitroglycerin 0.4 mg  As Needed  Thank you for choosing [facility]!

## 2014-06-22 DIAGNOSIS — H3532 Exudative age-related macular degeneration: Secondary | ICD-10-CM | POA: Diagnosis not present

## 2014-07-25 DIAGNOSIS — N183 Chronic kidney disease, stage 3 (moderate): Secondary | ICD-10-CM | POA: Diagnosis not present

## 2014-07-25 DIAGNOSIS — D509 Iron deficiency anemia, unspecified: Secondary | ICD-10-CM | POA: Diagnosis not present

## 2014-07-25 DIAGNOSIS — H3530 Unspecified macular degeneration: Secondary | ICD-10-CM | POA: Diagnosis not present

## 2014-07-25 DIAGNOSIS — R0602 Shortness of breath: Secondary | ICD-10-CM | POA: Diagnosis not present

## 2014-08-04 ENCOUNTER — Other Ambulatory Visit: Payer: Self-pay

## 2014-08-04 MED ORDER — OMEPRAZOLE 20 MG PO CPDR: DELAYED_RELEASE_CAPSULE | ORAL | Status: DC

## 2014-08-11 ENCOUNTER — Encounter (HOSPITAL_COMMUNITY)
Admission: RE | Admit: 2014-08-11 | Discharge: 2014-08-11 | Disposition: A | Discharge status: Home / Self Care | Payer: Medicare Other | Source: Ambulatory Visit | Attending: Nephrology | Admitting: Nephrology

## 2014-08-11 ENCOUNTER — Encounter (HOSPITAL_COMMUNITY): Payer: Self-pay

## 2014-08-11 DIAGNOSIS — N183 Chronic kidney disease, stage 3 (moderate): Secondary | ICD-10-CM | POA: Insufficient documentation

## 2014-08-11 DIAGNOSIS — D631 Anemia in chronic kidney disease: Secondary | ICD-10-CM | POA: Diagnosis not present

## 2014-08-11 LAB — RENAL FUNCTION PANEL
Albumin: 3.7 g/dL (ref 3.5–5.0)
Anion gap: 7 (ref 5–15)
BUN: 25 mg/dL — ABNORMAL HIGH (ref 6–20)
CHLORIDE: 100 mmol/L — AB (ref 101–111)
CO2: 31 mmol/L (ref 22–32)
CREATININE: 1.82 mg/dL — AB (ref 0.44–1.00)
Calcium: 9.6 mg/dL (ref 8.9–10.3)
GFR calc Af Amer: 26 mL/min — ABNORMAL LOW (ref >60)
GFR, EST NON AFRICAN AMERICAN: 22 mL/min — AB (ref >60)
Glucose, Bld: 100 mg/dL — ABNORMAL HIGH (ref 65–99)
PHOSPHORUS: 3.6 mg/dL (ref 2.5–4.6)
Potassium: 4 mmol/L (ref 3.5–5.1)
SODIUM: 138 mmol/L (ref 135–145)

## 2014-08-11 LAB — HEMOGLOBIN AND HEMATOCRIT, BLOOD
HCT: 30.9 % — ABNORMAL LOW (ref 36.0–46.0)
HEMOGLOBIN: 10.2 g/dL — AB (ref 12.0–15.0)

## 2014-08-11 NOTE — Progress Notes (Signed)
Results for [name] [name], [name] [name] (MRN [id number]) as of 08/11/2014 14:28 Arrived for lab work, no aranesp given per guidelines from MD order,next appointment 10/20/2014   Ref. Range 08/11/2014 12:45  Sodium Latest Ref Range: 135-145 mmol/L 138  Potassium Latest Ref Range: 3.5-5.1 mmol/L 4.0  Chloride Latest Ref Range: 101-111 mmol/L 100 (L)  CO2 Latest Ref Range: 22-32 mmol/L 31  BUN Latest Ref Range: 6-20 mg/dL 25 (H)  Creatinine Latest Ref Range: 0.44-1.00 mg/dL 1.82 (H)  Calcium Latest Ref Range: 8.9-10.3 mg/dL 9.6  EGFR (Non-African Amer.) Latest Ref Range: >60 mL/min 22 (L)  EGFR (African American) Latest Ref Range: >60 mL/min 26 (L)  Glucose Latest Ref Range: 65-99 mg/dL 100 (H)  Anion gap Latest Ref Range: 5-15  7  Phosphorus Latest Ref Range: 2.5-4.6 mg/dL 3.6  Albumin Latest Ref Range: 3.5-5.0 g/dL 3.7  Hemoglobin Latest Ref Range: 12.0-15.0 g/dL 10.2 (L)  HCT Latest Ref Range: 36.0-46.0 % 30.9 (L)

## 2014-08-22 ENCOUNTER — Other Ambulatory Visit (HOSPITAL_COMMUNITY): Payer: Self-pay | Admitting: Internal Medicine

## 2014-08-22 ENCOUNTER — Ambulatory Visit (HOSPITAL_COMMUNITY)
Admission: RE | Admit: 2014-08-22 | Discharge: 2014-08-22 | Disposition: A | Discharge status: Home / Self Care | Payer: Medicare Other | Source: Ambulatory Visit | Attending: Internal Medicine | Admitting: Internal Medicine

## 2014-08-22 ENCOUNTER — Ambulatory Visit (HOSPITAL_COMMUNITY)
Admission: RE | Admit: 2014-08-22 | Discharge: 2014-08-22 | Disposition: A | Discharge status: Home / Self Care | Payer: Self-pay | Source: Ambulatory Visit | Attending: Internal Medicine | Admitting: Internal Medicine

## 2014-08-22 DIAGNOSIS — D649 Anemia, unspecified: Secondary | ICD-10-CM | POA: Diagnosis not present

## 2014-08-22 DIAGNOSIS — R52 Pain, unspecified: Secondary | ICD-10-CM

## 2014-08-22 DIAGNOSIS — Z8719 Personal history of other diseases of the digestive system: Secondary | ICD-10-CM | POA: Diagnosis not present

## 2014-08-22 DIAGNOSIS — I517 Cardiomegaly: Secondary | ICD-10-CM | POA: Diagnosis not present

## 2014-08-22 DIAGNOSIS — K219 Gastro-esophageal reflux disease without esophagitis: Secondary | ICD-10-CM | POA: Diagnosis not present

## 2014-08-22 DIAGNOSIS — R109 Unspecified abdominal pain: Secondary | ICD-10-CM | POA: Diagnosis not present

## 2014-08-22 DIAGNOSIS — R1031 Right lower quadrant pain: Secondary | ICD-10-CM | POA: Insufficient documentation

## 2014-08-22 DIAGNOSIS — N760 Acute vaginitis: Secondary | ICD-10-CM | POA: Diagnosis not present

## 2014-08-25 ENCOUNTER — Inpatient Hospital Stay (HOSPITAL_COMMUNITY): Admission: RE | Admit: 2014-08-25 | Payer: Managed Care, Other (non HMO) | Source: Ambulatory Visit

## 2014-08-25 ENCOUNTER — Encounter (HOSPITAL_COMMUNITY)
Admission: RE | Admit: 2014-08-25 | Discharge: 2014-08-25 | Disposition: A | Discharge status: Home / Self Care | Payer: Medicare Other | Source: Ambulatory Visit | Attending: Nephrology | Admitting: Nephrology

## 2014-08-25 DIAGNOSIS — D631 Anemia in chronic kidney disease: Secondary | ICD-10-CM | POA: Diagnosis not present

## 2014-08-25 DIAGNOSIS — N183 Chronic kidney disease, stage 3 (moderate): Secondary | ICD-10-CM | POA: Diagnosis not present

## 2014-08-25 MED ORDER — DARBEPOETIN ALFA 100 MCG/0.5ML IJ SOSY
100.0000 ug | PREFILLED_SYRINGE | Freq: Once | INTRAMUSCULAR | Status: AC
  Administered 2014-08-25: 100 ug via SUBCUTANEOUS

## 2014-08-25 MED ORDER — DARBEPOETIN ALFA 100 MCG/0.5ML IJ SOSY
PREFILLED_SYRINGE | INTRAMUSCULAR | Status: AC
  Filled 2014-08-25: qty 0.5

## 2014-08-25 NOTE — Progress Notes (Signed)
Injection given off schedule per dr office. Office will call and reschedule patient appt date.

## 2014-08-31 DIAGNOSIS — J189 Pneumonia, unspecified organism: Secondary | ICD-10-CM | POA: Diagnosis not present

## 2014-09-07 DIAGNOSIS — R0602 Shortness of breath: Secondary | ICD-10-CM | POA: Diagnosis not present

## 2014-09-07 DIAGNOSIS — R062 Wheezing: Secondary | ICD-10-CM | POA: Diagnosis not present

## 2014-09-14 DIAGNOSIS — Z85828 Personal history of other malignant neoplasm of skin: Secondary | ICD-10-CM | POA: Diagnosis not present

## 2014-09-14 DIAGNOSIS — Z808 Family history of malignant neoplasm of other organs or systems: Secondary | ICD-10-CM | POA: Diagnosis not present

## 2014-09-14 DIAGNOSIS — L821 Other seborrheic keratosis: Secondary | ICD-10-CM | POA: Diagnosis not present

## 2014-09-14 DIAGNOSIS — T148 Other injury of unspecified body region: Secondary | ICD-10-CM | POA: Diagnosis not present

## 2014-09-14 DIAGNOSIS — L570 Actinic keratosis: Secondary | ICD-10-CM | POA: Diagnosis not present

## 2014-09-18 DIAGNOSIS — I10 Essential (primary) hypertension: Secondary | ICD-10-CM | POA: Diagnosis not present

## 2014-09-18 DIAGNOSIS — D638 Anemia in other chronic diseases classified elsewhere: Secondary | ICD-10-CM | POA: Diagnosis not present

## 2014-09-18 DIAGNOSIS — I350 Nonrheumatic aortic (valve) stenosis: Secondary | ICD-10-CM | POA: Diagnosis not present

## 2014-09-18 DIAGNOSIS — N183 Chronic kidney disease, stage 3 (moderate): Secondary | ICD-10-CM | POA: Diagnosis not present

## 2014-09-28 DIAGNOSIS — H3562 Retinal hemorrhage, left eye: Secondary | ICD-10-CM | POA: Diagnosis not present

## 2014-09-28 DIAGNOSIS — H3532 Exudative age-related macular degeneration: Secondary | ICD-10-CM | POA: Diagnosis not present

## 2014-09-28 DIAGNOSIS — H35053 Retinal neovascularization, unspecified, bilateral: Secondary | ICD-10-CM | POA: Diagnosis not present

## 2014-09-28 DIAGNOSIS — H35351 Cystoid macular degeneration, right eye: Secondary | ICD-10-CM | POA: Diagnosis not present

## 2014-10-17 ENCOUNTER — Ambulatory Visit: Payer: Medicare Other | Admitting: Cardiology

## 2014-10-18 ENCOUNTER — Ambulatory Visit (INDEPENDENT_AMBULATORY_CARE_PROVIDER_SITE_OTHER): Payer: Medicare Other | Admitting: Cardiology

## 2014-10-18 ENCOUNTER — Encounter: Payer: Self-pay | Admitting: Cardiology

## 2014-10-18 VITALS — BP 144/60 | HR 66 | Ht 60.0 in | Wt 105.0 lb

## 2014-10-18 DIAGNOSIS — I6523 Occlusion and stenosis of bilateral carotid arteries: Secondary | ICD-10-CM

## 2014-10-18 DIAGNOSIS — N289 Disorder of kidney and ureter, unspecified: Secondary | ICD-10-CM | POA: Diagnosis not present

## 2014-10-18 DIAGNOSIS — I10 Essential (primary) hypertension: Secondary | ICD-10-CM

## 2014-10-18 DIAGNOSIS — E559 Vitamin D deficiency, unspecified: Secondary | ICD-10-CM

## 2014-10-18 DIAGNOSIS — I359 Nonrheumatic aortic valve disorder, unspecified: Secondary | ICD-10-CM

## 2014-10-18 NOTE — Progress Notes (Signed)
Patient ID: [name] [name], female   DOB: 29-Oct-1914, 81 y.o.   MRN: [id number]     Clinical Summary [name] [name] is a 81 y.o.female seen today for follow up of the following medical problems.   1. Aortic stenosis  - echo 03/2013 AVA 1.18 Mean grad 24 - denies any SOB or DOE since last visit - denies any chest pain. No dizziness, no syncope.   2. Anemia - she in on aranesp, managed by nephrology  3. HTN  - does not check regularly - compliant with meds   4. Carotid stenosis  - mild disease on most recent ultrasound  - denies any neurological symptoms  5. Dizziness -resolved after decreasing her Toprol  Past Medical History  Diagnosis Date  . Backache, unspecified   . Dizziness and giddiness   . Thyrotoxicosis without mention of goiter or other cause, without mention of thyrotoxic crisis or storm   . Unspecified disorder resulting from impaired renal function   . Anemia, unspecified   . Unspecified essential hypertension   . COPD (chronic obstructive pulmonary disease)      Allergies  Allergen Reactions  . Neosporin [Neomycin-Bacitracin Zn-Polymyx] Rash  . Nitrofurantoin Monohyd Macro Nausea And Vomiting and Other (See Comments)    Fever, chills  . Penicillins Rash     Current Outpatient Prescriptions  Medication Sig Dispense Refill  . acetaminophen (TYLENOL) 500 MG tablet Take 1,000 mg by mouth every 6 (six) hours as needed for moderate pain.     Marland Kitchen amLODipine (NORVASC) 10 MG tablet Take 1 tablet (10 mg total) by mouth daily. 90 tablet 3  . aspirin 81 MG tablet Take 81 mg by mouth daily.      . darbepoetin alfa-polysorbate (ARANESP, ALBUMIN FREE,) 100 MCG/ML injection Inject 1 mL (100 mcg total) into the skin once. 0.4 mL 0  . docusate sodium (COLACE) 100 MG capsule Take 100 mg by mouth 2 (two) times daily.      . hydrochlorothiazide (MICROZIDE) 12.5 MG capsule Take 12.5 mg by mouth daily.    Marland Kitchen latanoprost (XALATAN) 0.005 % ophthalmic solution Place 1 drop into  both eyes at bedtime.     Marland Kitchen levothyroxine (SYNTHROID, LEVOTHROID) 50 MCG tablet Take 50 mcg by mouth daily.      . metoprolol succinate (TOPROL-XL) 50 MG 24 hr tablet Take 1 tablet (50 mg total) by mouth daily. Take with or immediately following a meal. 90 tablet 3  . Multiple Vitamins-Minerals (MULTIVITAMIN WITH MINERALS) tablet Take 1 tablet by mouth daily.      . Multiple Vitamins-Minerals (VISION FORMULA PO) Take 1 tablet by mouth daily.      . nitroGLYCERIN (NITROSTAT) 0.4 MG SL tablet Place 1 tablet (0.4 mg total) under the tongue every 5 (five) minutes as needed for chest pain. 25 tablet 3  . omeprazole (PRILOSEC) 20 MG capsule TAKE 1-2 CAPSULES BY MOUTH ONCE DAILY. 60 capsule 3  . polyethylene glycol (MIRALAX / GLYCOLAX) packet Take 17 g by mouth at bedtime.     . sertraline (ZOLOFT) 50 MG tablet Take 100 mg by mouth daily.     . traMADol (ULTRAM) 50 MG tablet Take 50 mg by mouth every 6 (six) hours as needed for moderate pain.     Marland Kitchen ZOSTAVAX 77412 UNT/0.65ML injection Inject 0.65 mLs into the skin once.      No current facility-administered medications for this visit.   Facility-Administered Medications Ordered in Other Visits  Medication Dose Route Frequency Provider Last Rate Last  Dose  . darbepoetin (ARANESP) injection 100 mcg  100 mcg Subcutaneous Once Fran Lowes, MD         Past Surgical History  Procedure Laterality Date  . Small bowel obst    . Salivary gland surgery Left   . Back surgery      Kyphoplasty     Allergies  Allergen Reactions  . Neosporin [Neomycin-Bacitracin Zn-Polymyx] Rash  . Nitrofurantoin Monohyd Macro Nausea And Vomiting and Other (See Comments)    Fever, chills  . Penicillins Rash      No family history on file.   Social History [name] [name] reports that she has never smoked. She has never used smokeless tobacco. [name] [name] reports that she does not drink alcohol.   Review of Systems CONSTITUTIONAL: No weight loss, fever, chills,  weakness or fatigue.  HEENT: Eyes: No visual loss, blurred vision, double vision or yellow sclerae.No hearing loss, sneezing, congestion, runny nose or sore throat.  SKIN: No rash or itching.  CARDIOVASCULAR: per HPI RESPIRATORY: No shortness of breath, cough or sputum.  GASTROINTESTINAL: No anorexia, nausea, vomiting or diarrhea. No abdominal pain or blood.  GENITOURINARY: No burning on urination, no polyuria NEUROLOGICAL: No headache, dizziness, syncope, paralysis, ataxia, numbness or tingling in the extremities. No change in bowel or bladder control.  MUSCULOSKELETAL: No muscle, back pain, joint pain or stiffness.  LYMPHATICS: No enlarged nodes. No history of splenectomy.  PSYCHIATRIC: No history of depression or anxiety.  ENDOCRINOLOGIC: No reports of sweating, cold or heat intolerance. No polyuria or polydipsia.  Marland Kitchen   Physical Examination Filed Vitals:   10/18/14 1417  BP: 144/60  Pulse: 66   Filed Vitals:   10/18/14 1417  Height: 5' (1.524 m)  Weight: 105 lb (47.628 kg)    Gen: resting comfortably, no acute distress HEENT: no scleral icterus, pupils equal round and reactive, no palptable cervical adenopathy,  CV: RRR, 3/6 systolic murmur RUSB, no jvd. Bilateral carotid bruits Resp: Clear to auscultation bilaterally GI: abdomen is soft, non-tender, non-distended, normal bowel sounds, no hepatosplenomegaly MSK: extremities are warm, no edema.  Skin: warm, no rash Neuro:  no focal deficits Psych: appropriate affect   Diagnostic Studies 03/2011 Echo: LVEF 55-60%, mild LVH, no WMAs, grade I diastolic dysfunction, moderate AS (mead grad 13, AVA 1.15 by VTI, dimensionless index 0.45).   03/2011 Renal Artery Duplex: difficult study, normal renal arteries   03/2011 Carotid US:  1. Bilateral carotid bifurcation and proximal ICA plaque, resulting in less than 50% diameter stenosis. Little convincing progression since previous exam. The exam does not exclude plaque ulceration  or embolization. Continued surveillance recommended.  03/2013 echo Study Conclusions  - Left ventricle: The cavity size was normal. Wall thickness was increased in a pattern of mild LVH. Systolic function was normal. The estimated ejection fraction was in the range of 50% to 55%. Although no diagnostic regional wall motion abnormality was identified, this possibility cannot be completely excluded on the basis of this study. Doppler parameters are consistent with abnormal left ventricular relaxation (grade 1 diastolic dysfunction). - Aortic valve: Moderately calcified annulus. Possibly functionally bicuspid; moderately calcified leaflets. Cusp separation was severely reduced. There was moderate to severe stenosis. Mild regurgitation. Mean gradient: 41mm Hg (S). Peak gradient: 46mm Hg (S). LVOT/AV VTI ratio 0.36. Valve area: 1.18cm^2(VTI). - Mitral valve: Calcified annulus. Mildly thickened leaflets . Trivial regurgitation. - Left atrium: The atrium was mildly dilated. - Right atrium: Central venous pressure: 11mm Hg (est). - Atrial septum: No defect or patent  foramen ovale was identified. - Tricuspid valve: Trivial regurgitation. - Pulmonary arteries: PA peak pressure: 73mm Hg (S). - Pericardium, extracardiac: A prominent pericardial fat pad was present. Impressions:  - Mild LVH with LVEF 02-40%, grade 1 diastolic dysfunction. Mild left atrial enlargement. MAC with trivial mitral regurgitation. Moderate to severe aortic stenosis as outlined above. Gradients have increased somewhat compared to prior study. Mild aortic regurgitation. PASP 39 mmHg.    Assessment and Plan  1. Aortic stenosis  - moderate by most recent echo, continue to follow clinically - denies any significant symptoms   2. HTN  - at goal. High doses of bp meds caused dizziness. Continue current meds  3. Carotid stenosis  - continue to follow clinically, no current  symptoms      F/u 6 months    Arnoldo Lenis, M.D

## 2014-10-18 NOTE — Patient Instructions (Signed)
Your physician wants you to follow-up in: 6 months with Dr Bryna Colander will receive a reminder letter in the mail two months in advance. If you don't receive a letter, please call our office to schedule the follow-up appointment.    Your physician recommends that you continue on your current medications as directed. Please refer to the Current Medication list given to you today.      Please get FASTING lab work     Thank you for choosing [facility] !

## 2014-10-20 ENCOUNTER — Encounter (HOSPITAL_COMMUNITY)
Admission: RE | Admit: 2014-10-20 | Discharge: 2014-10-20 | Disposition: A | Discharge status: Home / Self Care | Payer: Medicare Other | Source: Ambulatory Visit | Attending: Nephrology | Admitting: Nephrology

## 2014-10-20 DIAGNOSIS — N183 Chronic kidney disease, stage 3 (moderate): Secondary | ICD-10-CM | POA: Insufficient documentation

## 2014-10-20 DIAGNOSIS — D631 Anemia in chronic kidney disease: Secondary | ICD-10-CM | POA: Diagnosis not present

## 2014-10-20 MED ORDER — DARBEPOETIN ALFA 100 MCG/0.5ML IJ SOSY
PREFILLED_SYRINGE | INTRAMUSCULAR | Status: AC
  Filled 2014-10-20: qty 0.5

## 2014-10-20 MED ORDER — DARBEPOETIN ALFA 100 MCG/0.5ML IJ SOSY
100.0000 ug | PREFILLED_SYRINGE | Freq: Once | INTRAMUSCULAR | Status: AC
  Administered 2014-10-20: 100 ug via SUBCUTANEOUS

## 2014-10-20 NOTE — Progress Notes (Signed)
Labs drawn on 10/18/14. hgb 9.9 therefore aranesp 100 mcg given.

## 2014-11-14 DIAGNOSIS — E039 Hypothyroidism, unspecified: Secondary | ICD-10-CM | POA: Diagnosis not present

## 2014-11-14 DIAGNOSIS — G4700 Insomnia, unspecified: Secondary | ICD-10-CM | POA: Diagnosis not present

## 2014-11-14 DIAGNOSIS — R0602 Shortness of breath: Secondary | ICD-10-CM | POA: Diagnosis not present

## 2014-11-14 DIAGNOSIS — M65342 Trigger finger, left ring finger: Secondary | ICD-10-CM | POA: Diagnosis not present

## 2014-11-14 DIAGNOSIS — D509 Iron deficiency anemia, unspecified: Secondary | ICD-10-CM | POA: Diagnosis not present

## 2014-11-14 DIAGNOSIS — H3530 Unspecified macular degeneration: Secondary | ICD-10-CM | POA: Diagnosis not present

## 2014-11-14 DIAGNOSIS — I350 Nonrheumatic aortic (valve) stenosis: Secondary | ICD-10-CM | POA: Diagnosis not present

## 2014-11-14 DIAGNOSIS — N183 Chronic kidney disease, stage 3 (moderate): Secondary | ICD-10-CM | POA: Diagnosis not present

## 2014-12-12 DIAGNOSIS — R946 Abnormal results of thyroid function studies: Secondary | ICD-10-CM | POA: Diagnosis not present

## 2014-12-12 DIAGNOSIS — M65349 Trigger finger, unspecified ring finger: Secondary | ICD-10-CM | POA: Diagnosis not present

## 2014-12-12 DIAGNOSIS — E039 Hypothyroidism, unspecified: Secondary | ICD-10-CM | POA: Diagnosis not present

## 2014-12-12 DIAGNOSIS — R0602 Shortness of breath: Secondary | ICD-10-CM | POA: Diagnosis not present

## 2014-12-27 DIAGNOSIS — M79641 Pain in right hand: Secondary | ICD-10-CM | POA: Diagnosis not present

## 2014-12-27 DIAGNOSIS — M65331 Trigger finger, right middle finger: Secondary | ICD-10-CM | POA: Diagnosis not present

## 2014-12-27 DIAGNOSIS — M25641 Stiffness of right hand, not elsewhere classified: Secondary | ICD-10-CM | POA: Diagnosis not present

## 2014-12-27 DIAGNOSIS — M65341 Trigger finger, right ring finger: Secondary | ICD-10-CM | POA: Diagnosis not present

## 2014-12-29 ENCOUNTER — Encounter (HOSPITAL_COMMUNITY): Payer: Medicare Other

## 2014-12-29 ENCOUNTER — Encounter (HOSPITAL_COMMUNITY): Admission: RE | Admit: 2014-12-29 | Payer: Medicare Other | Source: Ambulatory Visit

## 2015-01-02 ENCOUNTER — Encounter (HOSPITAL_COMMUNITY): Admission: RE | Admit: 2015-01-02 | Payer: Medicare Other | Source: Ambulatory Visit

## 2015-01-02 ENCOUNTER — Encounter (HOSPITAL_COMMUNITY): Payer: Medicare Other

## 2015-01-03 ENCOUNTER — Encounter (HOSPITAL_COMMUNITY)
Admission: RE | Admit: 2015-01-03 | Discharge: 2015-01-03 | Disposition: A | Discharge status: Home / Self Care | Payer: Medicare Other | Source: Ambulatory Visit | Attending: Nephrology | Admitting: Nephrology

## 2015-01-03 DIAGNOSIS — D631 Anemia in chronic kidney disease: Secondary | ICD-10-CM | POA: Diagnosis not present

## 2015-01-03 DIAGNOSIS — N183 Chronic kidney disease, stage 3 (moderate): Secondary | ICD-10-CM | POA: Diagnosis not present

## 2015-01-03 LAB — RENAL FUNCTION PANEL
ALBUMIN: 3.6 g/dL (ref 3.5–5.0)
Anion gap: 11 (ref 5–15)
BUN: 29 mg/dL — AB (ref 6–20)
CALCIUM: 9.6 mg/dL (ref 8.9–10.3)
CHLORIDE: 100 mmol/L — AB (ref 101–111)
CO2: 28 mmol/L (ref 22–32)
CREATININE: 2.22 mg/dL — AB (ref 0.44–1.00)
GFR calc Af Amer: 20 mL/min — ABNORMAL LOW (ref >60)
GFR, EST NON AFRICAN AMERICAN: 17 mL/min — AB (ref >60)
Glucose, Bld: 110 mg/dL — ABNORMAL HIGH (ref 65–99)
Phosphorus: 3.6 mg/dL (ref 2.5–4.6)
Potassium: 4.4 mmol/L (ref 3.5–5.1)
SODIUM: 139 mmol/L (ref 135–145)

## 2015-01-03 LAB — HEMOGLOBIN AND HEMATOCRIT, BLOOD
HEMATOCRIT: 29.2 % — AB (ref 36.0–46.0)
HEMOGLOBIN: 9.6 g/dL — AB (ref 12.0–15.0)

## 2015-01-03 MED ORDER — DARBEPOETIN ALFA 100 MCG/0.5ML IJ SOSY
PREFILLED_SYRINGE | INTRAMUSCULAR | Status: AC
  Filled 2015-01-03: qty 0.5

## 2015-01-03 MED ORDER — DARBEPOETIN ALFA 100 MCG/0.5ML IJ SOSY
100.0000 ug | PREFILLED_SYRINGE | Freq: Once | INTRAMUSCULAR | Status: AC
Start: 2015-01-03 — End: 2015-01-03
  Administered 2015-01-03: 100 ug via SUBCUTANEOUS

## 2015-01-04 DIAGNOSIS — R062 Wheezing: Secondary | ICD-10-CM | POA: Diagnosis not present

## 2015-01-04 DIAGNOSIS — R0981 Nasal congestion: Secondary | ICD-10-CM | POA: Diagnosis not present

## 2015-01-10 DIAGNOSIS — M79641 Pain in right hand: Secondary | ICD-10-CM | POA: Diagnosis not present

## 2015-01-10 DIAGNOSIS — M65331 Trigger finger, right middle finger: Secondary | ICD-10-CM | POA: Diagnosis not present

## 2015-01-10 DIAGNOSIS — M25641 Stiffness of right hand, not elsewhere classified: Secondary | ICD-10-CM | POA: Diagnosis not present

## 2015-01-10 DIAGNOSIS — M65341 Trigger finger, right ring finger: Secondary | ICD-10-CM | POA: Diagnosis not present

## 2015-02-21 DIAGNOSIS — D509 Iron deficiency anemia, unspecified: Secondary | ICD-10-CM | POA: Diagnosis not present

## 2015-02-21 DIAGNOSIS — N189 Chronic kidney disease, unspecified: Secondary | ICD-10-CM | POA: Diagnosis not present

## 2015-02-21 DIAGNOSIS — M810 Age-related osteoporosis without current pathological fracture: Secondary | ICD-10-CM | POA: Diagnosis not present

## 2015-02-26 DIAGNOSIS — N183 Chronic kidney disease, stage 3 (moderate): Secondary | ICD-10-CM | POA: Diagnosis not present

## 2015-02-26 DIAGNOSIS — H35053 Retinal neovascularization, unspecified, bilateral: Secondary | ICD-10-CM | POA: Diagnosis not present

## 2015-02-26 DIAGNOSIS — H353212 Exudative age-related macular degeneration, right eye, with inactive choroidal neovascularization: Secondary | ICD-10-CM | POA: Diagnosis not present

## 2015-02-26 DIAGNOSIS — D649 Anemia, unspecified: Secondary | ICD-10-CM | POA: Diagnosis not present

## 2015-02-26 DIAGNOSIS — R809 Proteinuria, unspecified: Secondary | ICD-10-CM | POA: Diagnosis not present

## 2015-02-26 DIAGNOSIS — H353222 Exudative age-related macular degeneration, left eye, with inactive choroidal neovascularization: Secondary | ICD-10-CM | POA: Diagnosis not present

## 2015-02-26 DIAGNOSIS — H353134 Nonexudative age-related macular degeneration, bilateral, advanced atrophic with subfoveal involvement: Secondary | ICD-10-CM | POA: Diagnosis not present

## 2015-03-05 ENCOUNTER — Encounter (HOSPITAL_COMMUNITY)
Admission: RE | Admit: 2015-03-05 | Discharge: 2015-03-05 | Disposition: A | Discharge status: Home / Self Care | Payer: Medicare Other | Source: Ambulatory Visit | Attending: Nephrology | Admitting: Nephrology

## 2015-03-05 DIAGNOSIS — D631 Anemia in chronic kidney disease: Secondary | ICD-10-CM | POA: Diagnosis not present

## 2015-03-05 DIAGNOSIS — N183 Chronic kidney disease, stage 3 (moderate): Secondary | ICD-10-CM | POA: Insufficient documentation

## 2015-03-05 MED ORDER — DARBEPOETIN ALFA 100 MCG/0.5ML IJ SOSY
100.0000 ug | PREFILLED_SYRINGE | Freq: Once | INTRAMUSCULAR | Status: AC
  Administered 2015-03-05: 100 ug via SUBCUTANEOUS
  Filled 2015-03-05: qty 0.5

## 2015-03-14 ENCOUNTER — Encounter (HOSPITAL_COMMUNITY): Payer: Medicare Other

## 2015-04-13 DIAGNOSIS — Z808 Family history of malignant neoplasm of other organs or systems: Secondary | ICD-10-CM | POA: Diagnosis not present

## 2015-04-13 DIAGNOSIS — L720 Epidermal cyst: Secondary | ICD-10-CM | POA: Diagnosis not present

## 2015-04-13 DIAGNOSIS — L821 Other seborrheic keratosis: Secondary | ICD-10-CM | POA: Diagnosis not present

## 2015-04-13 DIAGNOSIS — Z85828 Personal history of other malignant neoplasm of skin: Secondary | ICD-10-CM | POA: Diagnosis not present

## 2015-04-13 DIAGNOSIS — Z23 Encounter for immunization: Secondary | ICD-10-CM | POA: Diagnosis not present

## 2015-04-28 ENCOUNTER — Other Ambulatory Visit: Payer: Self-pay | Admitting: Cardiology

## 2015-05-14 ENCOUNTER — Encounter (HOSPITAL_COMMUNITY)
Admission: RE | Admit: 2015-05-14 | Discharge: 2015-05-14 | Disposition: A | Discharge status: Home / Self Care | Payer: Medicare Other | Source: Ambulatory Visit | Attending: Nephrology | Admitting: Nephrology

## 2015-05-14 ENCOUNTER — Other Ambulatory Visit (HOSPITAL_COMMUNITY): Payer: Medicare Other

## 2015-05-14 ENCOUNTER — Emergency Department (HOSPITAL_COMMUNITY): Payer: Medicare Other

## 2015-05-14 ENCOUNTER — Ambulatory Visit (HOSPITAL_COMMUNITY): Payer: Medicare Other

## 2015-05-14 ENCOUNTER — Encounter (HOSPITAL_COMMUNITY): Payer: Self-pay | Admitting: *Deleted

## 2015-05-14 ENCOUNTER — Emergency Department (HOSPITAL_COMMUNITY)
Admission: EM | Admit: 2015-05-14 | Discharge: 2015-05-14 | Disposition: A | Discharge status: Home / Self Care | Payer: Medicare Other | Attending: Emergency Medicine | Admitting: Emergency Medicine

## 2015-05-14 DIAGNOSIS — E860 Dehydration: Secondary | ICD-10-CM | POA: Insufficient documentation

## 2015-05-14 DIAGNOSIS — M7989 Other specified soft tissue disorders: Secondary | ICD-10-CM | POA: Diagnosis not present

## 2015-05-14 DIAGNOSIS — Z7982 Long term (current) use of aspirin: Secondary | ICD-10-CM | POA: Diagnosis not present

## 2015-05-14 DIAGNOSIS — I10 Essential (primary) hypertension: Secondary | ICD-10-CM | POA: Diagnosis not present

## 2015-05-14 DIAGNOSIS — R410 Disorientation, unspecified: Secondary | ICD-10-CM | POA: Diagnosis not present

## 2015-05-14 DIAGNOSIS — J449 Chronic obstructive pulmonary disease, unspecified: Secondary | ICD-10-CM | POA: Diagnosis not present

## 2015-05-14 DIAGNOSIS — Z79899 Other long term (current) drug therapy: Secondary | ICD-10-CM | POA: Diagnosis not present

## 2015-05-14 DIAGNOSIS — M79675 Pain in left toe(s): Secondary | ICD-10-CM | POA: Diagnosis not present

## 2015-05-14 DIAGNOSIS — R55 Syncope and collapse: Secondary | ICD-10-CM | POA: Diagnosis not present

## 2015-05-14 DIAGNOSIS — S299XXA Unspecified injury of thorax, initial encounter: Secondary | ICD-10-CM | POA: Diagnosis not present

## 2015-05-14 HISTORY — DX: Age-related osteoporosis without current pathological fracture: M81.0

## 2015-05-14 LAB — BASIC METABOLIC PANEL
ANION GAP: 9 (ref 5–15)
BUN: 28 mg/dL — ABNORMAL HIGH (ref 6–20)
CHLORIDE: 98 mmol/L — AB (ref 101–111)
CO2: 30 mmol/L (ref 22–32)
Calcium: 9.2 mg/dL (ref 8.9–10.3)
Creatinine, Ser: 1.86 mg/dL — ABNORMAL HIGH (ref 0.44–1.00)
GFR, EST AFRICAN AMERICAN: 25 mL/min — AB (ref >60)
GFR, EST NON AFRICAN AMERICAN: 21 mL/min — AB (ref >60)
Glucose, Bld: 128 mg/dL — ABNORMAL HIGH (ref 65–99)
Potassium: 3.7 mmol/L (ref 3.5–5.1)
Sodium: 137 mmol/L (ref 135–145)

## 2015-05-14 LAB — HEPATIC FUNCTION PANEL
ALBUMIN: 3.2 g/dL — AB (ref 3.5–5.0)
ALT: 9 U/L — AB (ref 14–54)
AST: 25 U/L (ref 15–41)
Alkaline Phosphatase: 64 U/L (ref 38–126)
BILIRUBIN TOTAL: 0.6 mg/dL (ref 0.3–1.2)
Total Protein: 7.1 g/dL (ref 6.5–8.1)

## 2015-05-14 LAB — URINALYSIS, ROUTINE W REFLEX MICROSCOPIC
Bilirubin Urine: NEGATIVE
Glucose, UA: NEGATIVE mg/dL
Ketones, ur: NEGATIVE mg/dL
NITRITE: NEGATIVE
PH: 7 (ref 5.0–8.0)
Protein, ur: NEGATIVE mg/dL
SPECIFIC GRAVITY, URINE: 1.01 (ref 1.005–1.030)

## 2015-05-14 LAB — CBC
HEMATOCRIT: 29.8 % — AB (ref 36.0–46.0)
HEMOGLOBIN: 9.9 g/dL — AB (ref 12.0–15.0)
MCH: 30.9 pg (ref 26.0–34.0)
MCHC: 33.2 g/dL (ref 30.0–36.0)
MCV: 93.1 fL (ref 78.0–100.0)
Platelets: 274 10*3/uL (ref 150–400)
RBC: 3.2 MIL/uL — AB (ref 3.87–5.11)
RDW: 13.1 % (ref 11.5–15.5)
WBC: 8.2 10*3/uL (ref 4.0–10.5)

## 2015-05-14 LAB — CBC WITH DIFFERENTIAL/PLATELET
BASOS ABS: 0 10*3/uL (ref 0.0–0.1)
Basophils Relative: 0 %
EOS PCT: 4 %
Eosinophils Absolute: 0.3 10*3/uL (ref 0.0–0.7)
LYMPHS ABS: 0.8 10*3/uL (ref 0.7–4.0)
LYMPHS PCT: 11 %
Monocytes Absolute: 0.9 10*3/uL (ref 0.1–1.0)
Monocytes Relative: 12 %
NEUTROS ABS: 5.5 10*3/uL (ref 1.7–7.7)
NEUTROS PCT: 73 %

## 2015-05-14 LAB — URINE MICROSCOPIC-ADD ON: Squamous Epithelial / LPF: NONE SEEN

## 2015-05-14 LAB — TROPONIN I: Troponin I: 0.03 ng/mL (ref <0.031)

## 2015-05-14 LAB — CBG MONITORING, ED: GLUCOSE-CAPILLARY: 120 mg/dL — AB (ref 65–99)

## 2015-05-14 MED ORDER — DARBEPOETIN ALFA 100 MCG/0.5ML IJ SOSY
100.0000 ug | PREFILLED_SYRINGE | Freq: Once | INTRAMUSCULAR | Status: AC
  Administered 2015-05-14: 100 ug via SUBCUTANEOUS

## 2015-05-14 MED ORDER — DARBEPOETIN ALFA 100 MCG/0.5ML IJ SOSY
PREFILLED_SYRINGE | INTRAMUSCULAR | Status: AC
  Filled 2015-05-14: qty 0.5

## 2015-05-14 MED ORDER — SODIUM CHLORIDE 0.9 % IV BOLUS (SEPSIS)
500.0000 mL | Freq: Once | INTRAVENOUS | Status: AC
  Administered 2015-05-14: 500 mL via INTRAVENOUS

## 2015-05-14 NOTE — ED Provider Notes (Signed)
CSN: [id number]     Arrival date & time 05/14/15  1058 History  By signing my name below, I, Stephania Fragmin, attest that this documentation has been prepared under the direction and in the presence of Noemi Chapel, MD. Electronically Signed: Stephania Fragmin, ED Scribe. 05/14/2015. 12:12 PM.     Chief Complaint  Patient presents with  . Loss of Consciousness   The history is provided by a relative. No language interpreter was used.    HPI Comments: [name] [name] is a 82 y.o. female with a history of aortic stenosis, COPD, and osteoporosis, who presents to the Emergency Department complaining of an unwitnessed episode of loss of consciousness that occurred 4 days ago. Patient's daughter states patient was sitting on the commode that day with fecal urgency, diarrhea, and abdominal cramping when she lost consciousness, falling down into the wedged area between the toilet and wall. Patient's daughter states she had left the house for about 1.5 hours that day and returned to find her in the bathroom. She estimates patient laid there for 1 hour. Her daughter notes patient had struck her left great toe and left ear, with some noted bruising to both areas. However, she states she primarily brought patient in today for unusual confusion since the fall, with short-term memory loss. She states patient is normally able to get up from a bed to ambulate with a walker and feed herself independently, but in the past 4 days she has been unable to do so without assistance. She notes that at one point yesterday, patient had been hallucinating her grandson's pet rabbit in her room eating her shoe and seeing people who were not there. She notes patient last week had a low-grade fever with a Tmax of 99.9. She states she has not had diarrhea since her single episode on the commode 4 days ago.   Past Medical History  Diagnosis Date  . Backache, unspecified   . Dizziness and giddiness   . Thyrotoxicosis without mention of goiter or  other cause, without mention of thyrotoxic crisis or storm   . Unspecified disorder resulting from impaired renal function   . Anemia, unspecified   . Unspecified essential hypertension   . COPD (chronic obstructive pulmonary disease) ([place])   . Osteoporosis    Past Surgical History  Procedure Laterality Date  . Small bowel obst    . Salivary gland surgery Left   . Back surgery      Kyphoplasty   No family history on file. Social History  Substance Use Topics  . Smoking status: Never Smoker   . Smokeless tobacco: Never Used  . Alcohol Use: No   OB History    No data available     Review of Systems  Constitutional: Positive for activity change.  Cardiovascular: Positive for syncope.  Gastrointestinal: Positive for abdominal pain and diarrhea.  Musculoskeletal: Positive for arthralgias.  Skin: Positive for color change.  Neurological: Positive for syncope.  Psychiatric/Behavioral: Positive for confusion.  All other systems reviewed and are negative.   Allergies  Neosporin; Nitrofurantoin monohyd macro; and Penicillins  Home Medications   Prior to Admission medications   Medication Sig Start Date End Date Taking? Authorizing Provider  acetaminophen (TYLENOL) 500 MG tablet Take 500 mg by mouth at bedtime.    Yes Historical Provider, MD  amLODipine (NORVASC) 10 MG tablet TAKE ONE TABLET BY MOUTH ONCE DAILY. 04/30/15  Yes Arnoldo Lenis, MD  aspirin 81 MG tablet Take 81 mg by mouth daily.  Yes Historical Provider, MD  darbepoetin alfa-polysorbate (ARANESP, ALBUMIN FREE,) 100 MCG/ML injection Inject 1 mL (100 mcg total) into the skin once. 08/14/10  Yes Fran Lowes, MD  docusate sodium (COLACE) 100 MG capsule Take 100 mg by mouth 2 (two) times daily.     Yes Historical Provider, MD  hydrochlorothiazide (HYDRODIURIL) 25 MG tablet Take 12.5 mg by mouth daily. 05/07/15  Yes Historical Provider, MD  latanoprost (XALATAN) 0.005 % ophthalmic solution Place 1 drop into both  eyes at bedtime.  07/28/11  Yes Historical Provider, MD  levothyroxine (SYNTHROID, LEVOTHROID) 75 MCG tablet Take 1 tablet by mouth daily. 04/28/15  Yes Historical Provider, MD  metoprolol succinate (TOPROL-XL) 50 MG 24 hr tablet Take 1 tablet (50 mg total) by mouth daily. Take with or immediately following a meal. Patient taking differently: Take 50 mg by mouth every evening. Take with or immediately following a meal. 06/07/14  Yes Arnoldo Lenis, MD  Multiple Vitamins-Minerals (MULTIVITAMIN WITH MINERALS) tablet Take 1 tablet by mouth daily.     Yes Historical Provider, MD  nitroGLYCERIN (NITROSTAT) 0.4 MG SL tablet Place 1 tablet (0.4 mg total) under the tongue every 5 (five) minutes as needed for chest pain. 06/07/14  Yes Arnoldo Lenis, MD  pantoprazole (PROTONIX) 40 MG tablet Take 40 mg by mouth daily.   Yes Historical Provider, MD  polyethylene glycol (MIRALAX / GLYCOLAX) packet Take 17 g by mouth at bedtime.    Yes Historical Provider, MD  sertraline (ZOLOFT) 100 MG tablet Take 100 mg by mouth daily.   Yes Historical Provider, MD  traMADol (ULTRAM) 50 MG tablet Take 50 mg by mouth every 6 (six) hours as needed for moderate pain.    Yes Historical Provider, MD  omeprazole (PRILOSEC) 20 MG capsule TAKE 1-2 CAPSULES BY MOUTH ONCE DAILY. Patient not taking: Reported on 05/14/2015 08/04/14   Arnoldo Lenis, MD   BP 150/84 mmHg  Pulse 69  Temp(Src) 98.1 F (36.7 C) (Oral)  Resp 18  Ht 5' (1.524 m)  Wt 100 lb 12.8 oz (45.723 kg)  BMI 19.69 kg/m2  SpO2 97% Physical Exam  Constitutional: She appears well-developed and well-nourished. No distress.  HENT:  Head: Normocephalic and atraumatic.  Mouth/Throat: Oropharynx is clear and moist. No oropharyngeal exudate.  Eyes: Conjunctivae and EOM are normal. Pupils are equal, round, and reactive to light. Right eye exhibits no discharge. Left eye exhibits no discharge. No scleral icterus.  Neck: Normal range of motion. Neck supple. No JVD present.  No thyromegaly present.  Cardiovascular: Normal rate, regular rhythm and intact distal pulses.  Exam reveals no gallop and no friction rub.   Murmur heard. Loud systolic murmur.   Pulmonary/Chest: Effort normal and breath sounds normal. No respiratory distress. She has no wheezes. She has no rales.  Abdominal: Soft. Bowel sounds are normal. She exhibits no distension and no mass. There is no tenderness.  Musculoskeletal: Normal range of motion. She exhibits no edema or tenderness.  Left great toe swollen and red.  Scoliosis and severe kyphosis of back.  Lymphadenopathy:    She has no cervical adenopathy.  Neurological: She is alert. Coordination normal.  Speaks clearly, normal strength, follows commands  Skin: Skin is warm and dry. No rash noted. No erythema.  Psychiatric: She has a normal mood and affect. Her behavior is normal.  Nursing note and vitals reviewed.   ED Course  Procedures (including critical care time)  DIAGNOSTIC STUDIES: Oxygen Saturation is 97% on RA, normal by  my interpretation.    COORDINATION OF CARE: 11:35 AM - Discussed treatment plan with pt'sdaughter at bedside. Pt's daughter verbalized understanding and agreed to plan.   Labs Review Labs Reviewed  BASIC METABOLIC PANEL - Abnormal; Notable for the following:    Chloride 98 (*)    Glucose, Bld 128 (*)    BUN 28 (*)    Creatinine, Ser 1.86 (*)    GFR calc non Af Amer 21 (*)    GFR calc Af Amer 25 (*)    All other components within normal limits  CBC - Abnormal; Notable for the following:    RBC 3.20 (*)    Hemoglobin 9.9 (*)    HCT 29.8 (*)    All other components within normal limits  URINALYSIS, ROUTINE W REFLEX MICROSCOPIC (NOT AT [facility]) - Abnormal; Notable for the following:    Hgb urine dipstick TRACE (*)    Leukocytes, UA TRACE (*)    All other components within normal limits  HEPATIC FUNCTION PANEL - Abnormal; Notable for the following:    Albumin 3.2 (*)    ALT 9 (*)    Bilirubin, Direct  <0.1 (*)    All other components within normal limits  URINE MICROSCOPIC-ADD ON - Abnormal; Notable for the following:    Bacteria, UA FEW (*)    All other components within normal limits  CBG MONITORING, ED - Abnormal; Notable for the following:    Glucose-Capillary 120 (*)    All other components within normal limits  URINE CULTURE  CBC WITH DIFFERENTIAL/PLATELET  TROPONIN I    Imaging Review No results found. I have personally reviewed and evaluated these images and lab results as part of my medical decision-making.   EKG Interpretation   Date/Time:  Monday May 14 2015 11:19:06 EDT Ventricular Rate:  73 PR Interval:  184 QRS Duration: 86 QT Interval:  489 QTC Calculation: 539 R Axis:   -21 Text Interpretation:  Sinus rhythm LVH with secondary repolarization  abnormality Prolonged QT interval since last tracing no significant change  Confirmed by Cardale Dorer  MD, Loralyn Rachel ([id number]) on 05/14/2015 1:03:02 PM      MDM   Final diagnoses:  Dehydration    *I personally performed the services described in this documentation, which was scribed in my presence. The recorded information has been reviewed and is accurate.      The pt overall did well, her VS remained reassuring, she had no further episodes of syncope and had no events on cardiac monitoring of concern.  UA was normal , culture is normal, Cr is baseline for this patient - she and family were informed of all results.    Noemi Chapel, MD 05/17/15 1000

## 2015-05-14 NOTE — ED Notes (Signed)
Patient with no complaints at this time. Respirations even and unlabored. Skin warm/dry. Discharge instructions reviewed with patient at this time. Patient given opportunity to voice concerns/ask questions. IV removed per policy and band-aid applied to site. Patient discharged at this time and left Emergency Department via wheelchair.  

## 2015-05-14 NOTE — ED Notes (Signed)
Pt states her left clavicle is hurting and is worse with palpation or movement.  Also c/o left foot/toe pain with bruising noted.  Daughter reports pt has been confused at times more than normal for the past week.  Pt is currently alert and oriented.

## 2015-05-14 NOTE — ED Notes (Signed)
Pt lives at home with her daughter.  Alert and oriented and fairly independent, ambulatory with walker. Pt has had congestion and URI with lowgrade fever since last Monday.  On Thursday she had a syncopal episode with fall (stuck head, tailbone and left shoulder).  Pt has had difficulty ambulating on her own since and has had increasing weakness and some confusion since.  Daughter also reports that pt has had poor appetite in last week and has had 5lbs weight loss.

## 2015-05-14 NOTE — ED Notes (Signed)
Daughter tells me that pt was sitting on commode and had abdominal cramping and large amount of diarrhea right before she fainted on Thursday.  Pt was not seen by a doctor after this episode

## 2015-05-16 LAB — URINE CULTURE
CULTURE: NO GROWTH
SPECIAL REQUESTS: NORMAL

## 2015-06-06 ENCOUNTER — Encounter (HOSPITAL_COMMUNITY): Payer: Self-pay | Admitting: Emergency Medicine

## 2015-06-06 ENCOUNTER — Emergency Department (HOSPITAL_COMMUNITY): Payer: Medicare Other

## 2015-06-06 ENCOUNTER — Inpatient Hospital Stay (HOSPITAL_COMMUNITY)
Admission: EM | Admit: 2015-06-06 | Discharge: 2015-06-07 | DRG: 092 | Disposition: A | Discharge status: Home Health | Payer: Medicare Other | Attending: Family Medicine | Admitting: Family Medicine

## 2015-06-06 DIAGNOSIS — Z66 Do not resuscitate: Secondary | ICD-10-CM

## 2015-06-06 DIAGNOSIS — M810 Age-related osteoporosis without current pathological fracture: Secondary | ICD-10-CM | POA: Diagnosis present

## 2015-06-06 DIAGNOSIS — T887XXA Unspecified adverse effect of drug or medicament, initial encounter: Secondary | ICD-10-CM | POA: Diagnosis not present

## 2015-06-06 DIAGNOSIS — R443 Hallucinations, unspecified: Secondary | ICD-10-CM | POA: Diagnosis present

## 2015-06-06 DIAGNOSIS — T7840XA Allergy, unspecified, initial encounter: Secondary | ICD-10-CM | POA: Diagnosis not present

## 2015-06-06 DIAGNOSIS — G92 Toxic encephalopathy: Principal | ICD-10-CM | POA: Diagnosis present

## 2015-06-06 DIAGNOSIS — T375X5A Adverse effect of antiviral drugs, initial encounter: Secondary | ICD-10-CM | POA: Diagnosis present

## 2015-06-06 DIAGNOSIS — R4182 Altered mental status, unspecified: Secondary | ICD-10-CM | POA: Diagnosis not present

## 2015-06-06 DIAGNOSIS — N390 Urinary tract infection, site not specified: Secondary | ICD-10-CM

## 2015-06-06 DIAGNOSIS — G9340 Encephalopathy, unspecified: Secondary | ICD-10-CM | POA: Diagnosis not present

## 2015-06-06 DIAGNOSIS — D649 Anemia, unspecified: Secondary | ICD-10-CM | POA: Diagnosis present

## 2015-06-06 DIAGNOSIS — R531 Weakness: Secondary | ICD-10-CM | POA: Diagnosis not present

## 2015-06-06 DIAGNOSIS — E869 Volume depletion, unspecified: Secondary | ICD-10-CM | POA: Diagnosis present

## 2015-06-06 DIAGNOSIS — B029 Zoster without complications: Secondary | ICD-10-CM | POA: Diagnosis not present

## 2015-06-06 DIAGNOSIS — R21 Rash and other nonspecific skin eruption: Secondary | ICD-10-CM | POA: Diagnosis not present

## 2015-06-06 DIAGNOSIS — H540 Blindness, both eyes: Secondary | ICD-10-CM

## 2015-06-06 DIAGNOSIS — F329 Major depressive disorder, single episode, unspecified: Secondary | ICD-10-CM | POA: Diagnosis present

## 2015-06-06 DIAGNOSIS — Z8673 Personal history of transient ischemic attack (TIA), and cerebral infarction without residual deficits: Secondary | ICD-10-CM

## 2015-06-06 DIAGNOSIS — H547 Unspecified visual loss: Secondary | ICD-10-CM | POA: Diagnosis present

## 2015-06-06 DIAGNOSIS — J449 Chronic obstructive pulmonary disease, unspecified: Secondary | ICD-10-CM | POA: Diagnosis present

## 2015-06-06 DIAGNOSIS — H3530 Unspecified macular degeneration: Secondary | ICD-10-CM | POA: Diagnosis present

## 2015-06-06 DIAGNOSIS — I129 Hypertensive chronic kidney disease with stage 1 through stage 4 chronic kidney disease, or unspecified chronic kidney disease: Secondary | ICD-10-CM | POA: Diagnosis not present

## 2015-06-06 DIAGNOSIS — N189 Chronic kidney disease, unspecified: Secondary | ICD-10-CM | POA: Diagnosis not present

## 2015-06-06 DIAGNOSIS — I10 Essential (primary) hypertension: Secondary | ICD-10-CM

## 2015-06-06 DIAGNOSIS — R8281 Pyuria: Secondary | ICD-10-CM | POA: Diagnosis present

## 2015-06-06 DIAGNOSIS — N184 Chronic kidney disease, stage 4 (severe): Secondary | ICD-10-CM | POA: Diagnosis present

## 2015-06-06 LAB — URINALYSIS, ROUTINE W REFLEX MICROSCOPIC
BILIRUBIN URINE: NEGATIVE
Glucose, UA: NEGATIVE mg/dL
KETONES UR: NEGATIVE mg/dL
Nitrite: NEGATIVE
PROTEIN: NEGATIVE mg/dL
Specific Gravity, Urine: <1.005 — ABNORMAL LOW (ref 1.005–1.030)
pH: 7 (ref 5.0–8.0)

## 2015-06-06 LAB — COMPREHENSIVE METABOLIC PANEL
ALBUMIN: 3.4 g/dL — AB (ref 3.5–5.0)
ALK PHOS: 78 U/L (ref 38–126)
ALT: 11 U/L — AB (ref 14–54)
AST: 26 U/L (ref 15–41)
Anion gap: 8 (ref 5–15)
BUN: 26 mg/dL — ABNORMAL HIGH (ref 6–20)
CO2: 28 mmol/L (ref 22–32)
CREATININE: 1.89 mg/dL — AB (ref 0.44–1.00)
Calcium: 9.5 mg/dL (ref 8.9–10.3)
Chloride: 100 mmol/L — ABNORMAL LOW (ref 101–111)
GFR calc Af Amer: 24 mL/min — ABNORMAL LOW (ref >60)
GFR calc non Af Amer: 21 mL/min — ABNORMAL LOW (ref >60)
GLUCOSE: 113 mg/dL — AB (ref 65–99)
Potassium: 3.7 mmol/L (ref 3.5–5.1)
SODIUM: 136 mmol/L (ref 135–145)
Total Bilirubin: 0.4 mg/dL (ref 0.3–1.2)
Total Protein: 7.1 g/dL (ref 6.5–8.1)

## 2015-06-06 LAB — CBC WITH DIFFERENTIAL/PLATELET
Basophils Absolute: 0 10*3/uL (ref 0.0–0.1)
Basophils Relative: 1 %
EOS ABS: 0.1 10*3/uL (ref 0.0–0.7)
EOS PCT: 3 %
HCT: 34.9 % — ABNORMAL LOW (ref 36.0–46.0)
Hemoglobin: 11.4 g/dL — ABNORMAL LOW (ref 12.0–15.0)
LYMPHS ABS: 1 10*3/uL (ref 0.7–4.0)
Lymphocytes Relative: 23 %
MCH: 30.9 pg (ref 26.0–34.0)
MCHC: 32.7 g/dL (ref 30.0–36.0)
MCV: 94.6 fL (ref 78.0–100.0)
MONO ABS: 0.6 10*3/uL (ref 0.1–1.0)
Monocytes Relative: 15 %
Neutro Abs: 2.4 10*3/uL (ref 1.7–7.7)
Neutrophils Relative %: 58 %
PLATELETS: 240 10*3/uL (ref 150–400)
RBC: 3.69 MIL/uL — AB (ref 3.87–5.11)
RDW: 13.9 % (ref 11.5–15.5)
WBC: 4.2 10*3/uL (ref 4.0–10.5)

## 2015-06-06 LAB — URINE MICROSCOPIC-ADD ON

## 2015-06-06 LAB — TSH: TSH: 0.743 u[IU]/mL (ref 0.350–4.500)

## 2015-06-06 LAB — TROPONIN I: TROPONIN I: 0.05 ng/mL — AB (ref <0.031)

## 2015-06-06 MED ORDER — AMLODIPINE BESYLATE 5 MG PO TABS
10.0000 mg | ORAL_TABLET | Freq: Every day | ORAL | Status: DC
  Administered 2015-06-07: 10 mg via ORAL
  Filled 2015-06-06: qty 2

## 2015-06-06 MED ORDER — ONDANSETRON HCL 4 MG/2ML IJ SOLN: 4.0000 mg | Freq: Four times a day (QID) | INTRAMUSCULAR | Status: DC | PRN

## 2015-06-06 MED ORDER — ACETAMINOPHEN 325 MG PO TABS
650.0000 mg | ORAL_TABLET | Freq: Four times a day (QID) | ORAL | Status: DC | PRN
  Administered 2015-06-06 – 2015-06-07 (×2): 650 mg via ORAL
  Filled 2015-06-06 (×2): qty 2

## 2015-06-06 MED ORDER — SODIUM CHLORIDE 0.9% FLUSH
3.0000 mL | Freq: Two times a day (BID) | INTRAVENOUS | Status: DC
  Administered 2015-06-06 – 2015-06-07 (×2): 3 mL via INTRAVENOUS

## 2015-06-06 MED ORDER — ASPIRIN EC 81 MG PO TBEC
81.0000 mg | DELAYED_RELEASE_TABLET | Freq: Every day | ORAL | Status: DC
  Administered 2015-06-07: 81 mg via ORAL
  Filled 2015-06-06: qty 1

## 2015-06-06 MED ORDER — HEPARIN SODIUM (PORCINE) 5000 UNIT/ML IJ SOLN
5000.0000 [IU] | Freq: Two times a day (BID) | INTRAMUSCULAR | Status: DC
  Administered 2015-06-06 – 2015-06-07 (×2): 5000 [IU] via SUBCUTANEOUS
  Filled 2015-06-06 (×2): qty 1

## 2015-06-06 MED ORDER — POLYETHYLENE GLYCOL 3350 17 G PO PACK
17.0000 g | PACK | Freq: Every day | ORAL | Status: DC
  Administered 2015-06-06: 17 g via ORAL
  Filled 2015-06-06: qty 1

## 2015-06-06 MED ORDER — LEVOTHYROXINE SODIUM 75 MCG PO TABS
75.0000 ug | ORAL_TABLET | Freq: Every day | ORAL | Status: DC
  Administered 2015-06-07: 75 ug via ORAL
  Filled 2015-06-06: qty 1

## 2015-06-06 MED ORDER — SODIUM CHLORIDE 0.9 % IV SOLN
INTRAVENOUS | Status: AC
  Administered 2015-06-06: 17:00:00 via INTRAVENOUS

## 2015-06-06 MED ORDER — SERTRALINE HCL 50 MG PO TABS
100.0000 mg | ORAL_TABLET | Freq: Every day | ORAL | Status: DC
  Administered 2015-06-07: 100 mg via ORAL
  Filled 2015-06-06: qty 2

## 2015-06-06 MED ORDER — LEVOFLOXACIN IN D5W 250 MG/50ML IV SOLN
INTRAVENOUS | Status: AC
  Filled 2015-06-06: qty 50

## 2015-06-06 MED ORDER — METOPROLOL SUCCINATE ER 50 MG PO TB24
50.0000 mg | ORAL_TABLET | Freq: Every day | ORAL | Status: DC
  Administered 2015-06-06 – 2015-06-07 (×2): 50 mg via ORAL
  Filled 2015-06-06 (×2): qty 1

## 2015-06-06 MED ORDER — SODIUM CHLORIDE 0.9 % IV SOLN
INTRAVENOUS | Status: DC
  Administered 2015-06-06: 23:00:00 via INTRAVENOUS

## 2015-06-06 MED ORDER — ACETAMINOPHEN 650 MG RE SUPP: 650.0000 mg | Freq: Four times a day (QID) | RECTAL | Status: DC | PRN

## 2015-06-06 MED ORDER — ONDANSETRON HCL 4 MG PO TABS: 4.0000 mg | ORAL_TABLET | Freq: Four times a day (QID) | ORAL | Status: DC | PRN

## 2015-06-06 MED ORDER — SODIUM CHLORIDE 0.9 % IV BOLUS (SEPSIS)
500.0000 mL | Freq: Once | INTRAVENOUS | Status: AC
  Administered 2015-06-06: 500 mL via INTRAVENOUS

## 2015-06-06 MED ORDER — BISACODYL 10 MG RE SUPP: 10.0000 mg | Freq: Every day | RECTAL | Status: DC | PRN

## 2015-06-06 MED ORDER — ADULT MULTIVITAMIN W/MINERALS CH
1.0000 | ORAL_TABLET | Freq: Every day | ORAL | Status: DC
  Administered 2015-06-06 – 2015-06-07 (×2): 1 via ORAL
  Filled 2015-06-06 (×2): qty 1

## 2015-06-06 MED ORDER — LATANOPROST 0.005 % OP SOLN
1.0000 [drp] | Freq: Every day | OPHTHALMIC | Status: DC
  Filled 2015-06-06: qty 2.5

## 2015-06-06 MED ORDER — ACYCLOVIR SODIUM 50 MG/ML IV SOLN
10.0000 mg/kg | Freq: Three times a day (TID) | INTRAVENOUS | Status: DC
  Administered 2015-06-06: 455 mg via INTRAVENOUS
  Filled 2015-06-06 (×4): qty 9.1

## 2015-06-06 MED ORDER — MULTI-VITAMIN/MINERALS PO TABS: 1.0000 | ORAL_TABLET | Freq: Every day | ORAL | Status: DC

## 2015-06-06 MED ORDER — DEXTROSE 5 % IV SOLN
1.0000 g | Freq: Once | INTRAVENOUS | Status: AC
  Administered 2015-06-06: 1 g via INTRAVENOUS
  Filled 2015-06-06: qty 10

## 2015-06-06 MED ORDER — LEVOFLOXACIN IN D5W 250 MG/50ML IV SOLN
250.0000 mg | INTRAVENOUS | Status: DC
  Administered 2015-06-06: 250 mg via INTRAVENOUS
  Filled 2015-06-06: qty 50

## 2015-06-06 NOTE — Progress Notes (Signed)
Patients daughter is questioning why she can't have anything by mouth, paged on call MD, will follow any orders given and continue to monitor the patient.

## 2015-06-06 NOTE — ED Notes (Signed)
Negative pressure room on floor is malfunctioning. AC alerted and maintenance is working on issue. Patient to be held in ED until room available.

## 2015-06-06 NOTE — ED Provider Notes (Signed)
CSN: [id number]     Arrival date & time 06/06/15  1252 History   First MD Initiated Contact with Patient 06/06/15 1320     Chief Complaint  Patient presents with  . Altered Mental Status  . Herpes Zoster     (Consider location/radiation/quality/duration/timing/severity/associated sxs/prior Treatment) HPI Comments: 82 year old female with history of hyperthyroid, anemia, valve disease, TIA presents with altered mental status. Patient started with a rash diagnosed as shingles left flank on Saturday and was started on antiviral oral therapy that weekend. On Monday patient started having confusion which was intermittent and then worsened tablet hallucinations yesterday. Patient's daughter held her oral antiviral since last night mild improvement in symptoms however patient still significantly altered and generally weak on exam. Patient has not had fever but does feel warm per daughter. Patient is deathly weaker than her baseline.  Patient is a 82 y.o. female presenting with altered mental status. The history is provided by a relative and medical records.  Altered Mental Status   Past Medical History  Diagnosis Date  . Backache, unspecified   . Dizziness and giddiness   . Thyrotoxicosis without mention of goiter or other cause, without mention of thyrotoxic crisis or storm   . Unspecified disorder resulting from impaired renal function   . Anemia, unspecified   . Unspecified essential hypertension   . COPD (chronic obstructive pulmonary disease) ([place])   . Osteoporosis    Past Surgical History  Procedure Laterality Date  . Small bowel obst    . Salivary gland surgery Left   . Back surgery      Kyphoplasty   No family history on file. Social History  Substance Use Topics  . Smoking status: Never Smoker   . Smokeless tobacco: Never Used  . Alcohol Use: No   OB History    No data available     Review of Systems  Unable to perform ROS: Mental status change      Allergies   Neosporin; Nitrofurantoin monohyd macro; and Penicillins  Home Medications   Prior to Admission medications   Medication Sig Start Date End Date Taking? Authorizing Provider  acetaminophen (TYLENOL) 500 MG tablet Take 500 mg by mouth at bedtime as needed for mild pain or moderate pain.    Yes Historical Provider, MD  amLODipine (NORVASC) 10 MG tablet TAKE ONE TABLET BY MOUTH ONCE DAILY. 04/30/15  Yes Arnoldo Lenis, MD  aspirin 81 MG tablet Take 81 mg by mouth daily.     Yes Historical Provider, MD  darbepoetin alfa-polysorbate (ARANESP, ALBUMIN FREE,) 100 MCG/ML injection Inject 1 mL (100 mcg total) into the skin once. 08/14/10  Yes Fran Lowes, MD  docusate sodium (COLACE) 100 MG capsule Take 100 mg by mouth 2 (two) times daily.     Yes Historical Provider, MD  hydrochlorothiazide (HYDRODIURIL) 25 MG tablet Take 12.5 mg by mouth daily. 05/07/15  Yes Historical Provider, MD  latanoprost (XALATAN) 0.005 % ophthalmic solution Place 1 drop into both eyes at bedtime.  07/28/11  Yes Historical Provider, MD  levothyroxine (SYNTHROID, LEVOTHROID) 75 MCG tablet Take 1 tablet by mouth daily. 04/28/15  Yes Historical Provider, MD  metoprolol succinate (TOPROL-XL) 50 MG 24 hr tablet Take 1 tablet (50 mg total) by mouth daily. Take with or immediately following a meal. Patient taking differently: Take 100 mg by mouth every evening. Take with or immediately following a meal. 06/07/14  Yes Arnoldo Lenis, MD  Multiple Vitamins-Minerals (MULTIVITAMIN WITH MINERALS) tablet Take 1 tablet by  mouth daily.     Yes Historical Provider, MD  nitroGLYCERIN (NITROSTAT) 0.4 MG SL tablet Place 1 tablet (0.4 mg total) under the tongue every 5 (five) minutes as needed for chest pain. 06/07/14  Yes Arnoldo Lenis, MD  pantoprazole (PROTONIX) 40 MG tablet Take 40 mg by mouth daily.   Yes Historical Provider, MD  polyethylene glycol (MIRALAX / GLYCOLAX) packet Take 17 g by mouth at bedtime.    Yes Historical Provider, MD   sertraline (ZOLOFT) 100 MG tablet Take 100 mg by mouth daily.   Yes Historical Provider, MD  valACYclovir (VALTREX) 1000 MG tablet Take 1,000 mg by mouth 3 (three) times daily. [place] ON 06/04/2015   Yes Historical Provider, MD  omeprazole (PRILOSEC) 20 MG capsule TAKE 1-2 CAPSULES BY MOUTH ONCE DAILY. Patient not taking: Reported on 05/14/2015 08/04/14   Arnoldo Lenis, MD  traMADol (ULTRAM) 50 MG tablet Take 50 mg by mouth every 6 (six) hours as needed for moderate pain.     Historical Provider, MD   BP 171/74 mmHg  Pulse 76  Temp(Src) 98.1 F (36.7 C) (Oral)  Resp 22  Ht 5\' 1"  (1.549 m)  Wt 100 lb (45.36 kg)  BMI 18.90 kg/m2  SpO2 98% Physical Exam  Constitutional: She appears well-developed.  HENT:  Head: Normocephalic and atraumatic.  Dry mucous membranes  Eyes: Right eye exhibits no discharge. Left eye exhibits no discharge.  Neck: Normal range of motion. Neck supple. No tracheal deviation present.  Cardiovascular: Normal rate and regular rhythm.   Pulmonary/Chest: Effort normal and breath sounds normal.  Abdominal: Soft. She exhibits no distension. There is no tenderness. There is no guarding.  Musculoskeletal: She exhibits no edema.  Neurological: She is alert. GCS eye subscore is 3. GCS verbal subscore is 4. GCS motor subscore is 6.  Patient confused and general weakness on exam. Patient can move all extremities bilateral. Gross sensation intact. Pupils equal horizontal eye movements intact. No obvious facial droop or arm drift. Neck supple no meningismus.  Skin: Skin is warm. Rash noted.  Patient has macular rash with vesicular component left flank from anterior abdomen across to back does not cross midline. Consistent with shingles.  Psychiatric:  Confused  Nursing note and vitals reviewed.   ED Course  Procedures (including critical care time) Labs Review Labs Reviewed  COMPREHENSIVE METABOLIC PANEL - Abnormal; Notable for the following:    Chloride  100 (*)    Glucose, Bld 113 (*)    BUN 26 (*)    Creatinine, Ser 1.89 (*)    Albumin 3.4 (*)    ALT 11 (*)    GFR calc non Af Amer 21 (*)    GFR calc Af Amer 24 (*)    All other components within normal limits  CBC WITH DIFFERENTIAL/PLATELET - Abnormal; Notable for the following:    RBC 3.69 (*)    Hemoglobin 11.4 (*)    HCT 34.9 (*)    All other components within normal limits  URINALYSIS, ROUTINE W REFLEX MICROSCOPIC (NOT AT [facility]) - Abnormal; Notable for the following:    Specific Gravity, Urine <1.005 (*)    Hgb urine dipstick MODERATE (*)    Leukocytes, UA LARGE (*)    All other components within normal limits  URINE MICROSCOPIC-ADD ON - Abnormal; Notable for the following:    Squamous Epithelial / LPF 6-30 (*)    Bacteria, UA MANY (*)    All other components within normal limits  CULTURE, BLOOD (ROUTINE X 2)  CULTURE, BLOOD (ROUTINE X 2)  URINE CULTURE  TROPONIN I  TSH    Imaging Review Ct Head Wo Contrast  06/06/2015  CLINICAL DATA:  81 year old female with altered mental status and hallucinations since yesterday. Initial encounter. EXAM: CT HEAD WITHOUT CONTRAST TECHNIQUE: Contiguous axial images were obtained from the base of the skull through the vertex without intravenous contrast. COMPARISON:  05/04/2015 and earlier. FINDINGS: Chronic sphenoid sinusitis. Increased left sphenoid sinus fluid level since April. Mild maxillary sinus mucosal thickening appears stable. Other Visualized paranasal sinuses and mastoids are clear. Osteopenia No acute osseous abnormality identified. No acute orbit or scalp soft tissue finding. Calcified atherosclerosis at the skull base. Stable cerebral volume. Small chronic lacunar infarct in the superior left cerebellum is unchanged. Elsewhere gray-Heine matter differentiation is normal for age. No midline shift, ventriculomegaly, mass effect, evidence of mass lesion, intracranial hemorrhage or evidence of cortically based acute infarction.  IMPRESSION: No acute intracranial abnormality. Stable mild for age chronic small vessel disease. Electronically Signed   By: Genevie Ann M.D.   On: 06/06/2015 15:17   I have personally reviewed and evaluated these images and lab results as part of my medical decision-making.   EKG Interpretation   Date/Time:  Wednesday Jun 06 2015 12:59:43 EDT Ventricular Rate:  84 PR Interval:  186 QRS Duration: 84 QT Interval:  451 QTC Calculation: 533 R Axis:   -33 Text Interpretation:  Sinus rhythm Abnormal R-wave progression, early  transition LVH with secondary repolarization abnormality Prolonged QT  interval Baseline wander in lead(s) V4 Confirmed by Ronni Osterberg MD, Audi Conover  [id number]) on 06/06/2015 1:07:35 PM      MDM   Final diagnoses:  Acute encephalopathy  Shingles  UTI CRF  Patient presents with confusion/acute encephalopathy for the past 2 days. Discussed differential diagnosis with daughter including herpes encephalitis, metabolic/dehydration from recent infection, urine infection, other. We discussed lumbar puncture and daughter agrees with holding on lumbar puncture but treating with IV acyclovir and monitoring patient's progress in the hospital.  Concern for SE from medicines vs UTI at this time.  Antiviral and abx ordered.  The patients results and plan were reviewed and discussed.   Any x-rays performed were independently reviewed by myself.   Differential diagnosis were considered with the presenting HPI.  Medications  acyclovir (ZOVIRAX) 455 mg in dextrose 5 % 100 mL IVPB (455 mg Intravenous New Bag/Given 06/06/15 1416)  cefTRIAXone (ROCEPHIN) 1 g in dextrose 5 % 50 mL IVPB (not administered)  0.9 %  sodium chloride infusion (not administered)  sodium chloride 0.9 % bolus 500 mL (500 mLs Intravenous New Bag/Given 06/06/15 1415)    Filed Vitals:   06/06/15 1256 06/06/15 1303 06/06/15 1330 06/06/15 1400  BP:  149/75 158/73 171/74  Pulse: 88 85 80 76  Temp: 98.1 F (36.7 C)      TempSrc: Oral     Resp:  23 23 22   Height: 5\' 1"  (1.549 m)     Weight: 100 lb (45.36 kg)     SpO2:  99% 96% 98%    Final diagnoses:  Acute encephalopathy  Shingles    Admission/ observation were discussed with the admitting physician, patient and/or family and they are comfortable with the plan.     Elnora Morrison, MD 06/06/15 [phone]

## 2015-06-06 NOTE — ED Notes (Signed)
Per EMS patient is from home. Daughter states that patient was being treated for Shingles and started having hallucinations yesterday.

## 2015-06-06 NOTE — H&P (Signed)
[facility] History and Physical  [name] [name] WYO:[id number] DOB: 1914-07-11 DOA: 06/06/2015  Referring physician: Dr Reather Converse PCP: Wende Neighbors, MD   Chief Complaint: Confusion, shingles  HPI: [name] [name] is a 82 y.o. female with hx of HTN, low T4, depression presenting to ED with confusion in setting of recent herpes zoster infection.  Pt's daughter provides hist that patient last week developed a rash over her left chest going under the breast and around to the spine.  Daughter had Valtrex laying around and gave her 2.5 gm on Sat and 2.0 gm on Sunday, took her in to see PCP on Monday and ^'d to 3gm on Monday.  Monday night pt became confused, was hallucinating, seeing " all her dead relatives" w slurred speech and gen weakness, unable to get up like usual  She spoke w PCP on Tuesday and last valtrex was given at 3 pm yest which is about 24 hrs ago.  Today patient has been more alert and not hallucinating.  Daughter denies fever, chills, rash elsewhere, cough/ SOB, no n/v/ d.  Has been reporting L sided headache and L ear pain but no rash. 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  Walk w a walker and feeds herself at home.  Lives w daughter and son-in-law.  Takes medication for HTN, low thyroid and depression.   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  Her husband died 69 at age 83 and she cared for him before his death.  Pt cared for her parents and a sister also before their passing.  Daughter notes that no heroic measures are desired, +DNR status.      ROS  no joint pain  no diarrhea  no nausea/ vomiting  no dysuria  no difficulty voiding  no change in urine color    Where does patient live *as above Can patient participate in ADLs? some  Past Medical History  Past Medical History  Diagnosis Date  . Backache, unspecified   . Dizziness and giddiness   . Thyrotoxicosis without mention of goiter or other cause,  without mention of thyrotoxic crisis or storm   . Unspecified disorder resulting from impaired renal function   . Anemia, unspecified   . Unspecified essential hypertension   . COPD (chronic obstructive pulmonary disease) ([place])   . Osteoporosis    Past Surgical History  Past Surgical History  Procedure Laterality Date  . Small bowel obst    . Salivary gland surgery Left   . Back surgery      Kyphoplasty   Family History No family history on file. Social History  reports that she has never smoked. She has never used smokeless tobacco. She reports that she does not drink alcohol or use illicit drugs. Allergies  Allergies  Allergen Reactions  . Neosporin [Neomycin-Bacitracin Zn-Polymyx] Rash  . Nitrofurantoin Monohyd Macro Nausea And Vomiting and Other (See Comments)    Fever, chills  . Penicillins Rash    Has patient had a PCN reaction causing immediate rash, facial/tongue/throat swelling, SOB or lightheadedness with hypotension: Yes Has patient had a PCN reaction causing severe rash involving mucus membranes or skin necrosis: No Has patient had a PCN reaction that required hospitalization No Has patient had a PCN reaction occurring within the last 10 years: No If all of the above answers are "NO", then may proceed with Cephalosporin use.    Home medications Prior to Admission medications  Medication Sig Start Date End Date Taking? Authorizing Provider  acetaminophen (TYLENOL) 500 MG tablet Take 500 mg by mouth at bedtime as needed for mild pain or moderate pain.    Yes Historical Provider, MD  amLODipine (NORVASC) 10 MG tablet TAKE ONE TABLET BY MOUTH ONCE DAILY. 04/30/15  Yes Arnoldo Lenis, MD  aspirin 81 MG tablet Take 81 mg by mouth daily.     Yes Historical Provider, MD  darbepoetin alfa-polysorbate (ARANESP, ALBUMIN FREE,) 100 MCG/ML injection Inject 1 mL (100 mcg total) into the skin once. 08/14/10  Yes Fran Lowes, MD  docusate sodium (COLACE) 100 MG capsule Take  100 mg by mouth 2 (two) times daily.     Yes Historical Provider, MD  hydrochlorothiazide (HYDRODIURIL) 25 MG tablet Take 12.5 mg by mouth daily. 05/07/15  Yes Historical Provider, MD  latanoprost (XALATAN) 0.005 % ophthalmic solution Place 1 drop into both eyes at bedtime.  07/28/11  Yes Historical Provider, MD  levothyroxine (SYNTHROID, LEVOTHROID) 75 MCG tablet Take 1 tablet by mouth daily. 04/28/15  Yes Historical Provider, MD  metoprolol succinate (TOPROL-XL) 50 MG 24 hr tablet Take 1 tablet (50 mg total) by mouth daily. Take with or immediately following a meal. Patient taking differently: Take 100 mg by mouth every evening. Take with or immediately following a meal. 06/07/14  Yes Arnoldo Lenis, MD  Multiple Vitamins-Minerals (MULTIVITAMIN WITH MINERALS) tablet Take 1 tablet by mouth daily.     Yes Historical Provider, MD  nitroGLYCERIN (NITROSTAT) 0.4 MG SL tablet Place 1 tablet (0.4 mg total) under the tongue every 5 (five) minutes as needed for chest pain. 06/07/14  Yes Arnoldo Lenis, MD  pantoprazole (PROTONIX) 40 MG tablet Take 40 mg by mouth daily.   Yes Historical Provider, MD  polyethylene glycol (MIRALAX / GLYCOLAX) packet Take 17 g by mouth at bedtime.    Yes Historical Provider, MD  sertraline (ZOLOFT) 100 MG tablet Take 100 mg by mouth daily.   Yes Historical Provider, MD  valACYclovir (VALTREX) 1000 MG tablet Take 1,000 mg by mouth 3 (three) times daily. [place] ON 06/04/2015   Yes Historical Provider, MD  omeprazole (PRILOSEC) 20 MG capsule TAKE 1-2 CAPSULES BY MOUTH ONCE DAILY. Patient not taking: Reported on 05/14/2015 08/04/14   Arnoldo Lenis, MD  traMADol (ULTRAM) 50 MG tablet Take 50 mg by mouth every 6 (six) hours as needed for moderate pain.     Historical Provider, MD   Liver Function Tests  Recent Labs Lab 06/06/15 1404  AST 26  ALT 11*  ALKPHOS 78  BILITOT 0.4  PROT 7.1  ALBUMIN 3.4*   No results for input(s): LIPASE, AMYLASE in the last 168  hours. CBC  Recent Labs Lab 06/06/15 1404  WBC 4.2  NEUTROABS 2.4  HGB 11.4*  HCT 34.9*  MCV 94.6  PLT 294   Basic Metabolic Panel  Recent Labs Lab 06/06/15 1404  NA 136  K 3.7  CL 100*  CO2 28  GLUCOSE 113*  BUN 26*  CREATININE 1.89*  CALCIUM 9.5     Filed Vitals:   06/06/15 1430 06/06/15 1500 06/06/15 1530 06/06/15 1600  BP: 179/77 190/82 159/75 171/74  Pulse: 74 88 82 81  Temp:      TempSrc:      Resp: 19 18 19 20   Height:      Weight:      SpO2: 96% 98%  97%   Exam: VSS Gen weak and frail, very  weak, opens eyes and is O x 3 , cachectic No rash, cyanosis or gangrene Sclera anicteric, throat clear and dry  No jvd or bruits  Chest clear bilat  Macular and vesicular rash L chest from sternum to spine wrapping around RRR no MRG Abd soft ntnd no mass or ascites +bs  GU defer MS no joint effusions or deformity Ext no LE edema / no wounds or ulcers Neuro is sleepy, Ox 3, very little strength in arms or legs 1-2/5 bilat , nonfocal  CT HEad - unremarkable   Creat 1.89  Na 136  CO2 28  BUN 26  Glu 113  Abl 3.4  LFT's ok WBC 4  Hb 11  plt l240 UA > +many bact, large LE, 6-30 rbc/ wbc per hpf, 6-30 epi's   Assessment: 1. Altered mental status / hallucinations - resolving w holding of Valtrex.  Valtrex neurotoxicity well-described in patient w CKD.  Pt's eGFR is only 22 ml/min.  Would not expect herpes encephalitis to be improving now off Rx for last 24 hrs.  Will hold antiviral's altogether for now.  If MS remains stable will cont supportive care w/o antivirals, or possibly reinstitute at low renal dosing.  If MS worsens from here will have to reinstitute antivirals.  Airborne precautions w blisters.  2. Poss UTI - vs contam specimen, plan IV abx (PCN allergic), f/u cx results 3. Vol depletion - looks dry 4. CKD stage IV 5. HTN cont po meds 6. Blind sec mac degenration 7. DNR  Plan - as above   DVT Prophylaxis SQ hep  Code Status: DNR  Family  Communication: daughter  Disposition Plan: noen yet    Sol Blazing [facility] Pager [phone]  Cell [phone]  If 7PM-7AM, please contact night-coverage [email or web address] Password [facility] 06/06/2015, 4:27 PM

## 2015-06-07 DIAGNOSIS — T887XXA Unspecified adverse effect of drug or medicament, initial encounter: Secondary | ICD-10-CM

## 2015-06-07 LAB — CBC
HCT: 31 % — ABNORMAL LOW (ref 36.0–46.0)
HEMOGLOBIN: 10 g/dL — AB (ref 12.0–15.0)
MCH: 30.7 pg (ref 26.0–34.0)
MCHC: 32.3 g/dL (ref 30.0–36.0)
MCV: 95.1 fL (ref 78.0–100.0)
PLATELETS: 203 10*3/uL (ref 150–400)
RBC: 3.26 MIL/uL — AB (ref 3.87–5.11)
RDW: 13.8 % (ref 11.5–15.5)
WBC: 3.9 10*3/uL — AB (ref 4.0–10.5)

## 2015-06-07 LAB — BASIC METABOLIC PANEL
ANION GAP: 7 (ref 5–15)
BUN: 20 mg/dL (ref 6–20)
CALCIUM: 8.6 mg/dL — AB (ref 8.9–10.3)
CHLORIDE: 106 mmol/L (ref 101–111)
CO2: 26 mmol/L (ref 22–32)
CREATININE: 1.58 mg/dL — AB (ref 0.44–1.00)
GFR calc non Af Amer: 26 mL/min — ABNORMAL LOW (ref >60)
GFR, EST AFRICAN AMERICAN: 30 mL/min — AB (ref >60)
Glucose, Bld: 84 mg/dL (ref 65–99)
Potassium: 3.4 mmol/L — ABNORMAL LOW (ref 3.5–5.1)
SODIUM: 139 mmol/L (ref 135–145)

## 2015-06-07 MED ORDER — CEFUROXIME AXETIL 250 MG PO TABS
250.0000 mg | ORAL_TABLET | Freq: Every day | ORAL | Status: DC
  Administered 2015-06-07: 250 mg via ORAL
  Filled 2015-06-07: qty 1

## 2015-06-07 MED ORDER — ACYCLOVIR 800 MG PO TABS: 800.0000 mg | ORAL_TABLET | Freq: Three times a day (TID) | ORAL | Status: DC

## 2015-06-07 MED ORDER — ACYCLOVIR 800 MG PO TABS
800.0000 mg | ORAL_TABLET | Freq: Three times a day (TID) | ORAL | Status: DC
  Filled 2015-06-07 (×4): qty 1

## 2015-06-07 MED ORDER — CEFUROXIME AXETIL 250 MG PO TABS: 250.0000 mg | ORAL_TABLET | Freq: Every day | ORAL | Status: DC
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
## 2015-06-07 NOTE — Progress Notes (Signed)
Patient discharged home with daughter.  IV removed - WNL.  Reviewed medications and DC instructions with daughter.   Instructed to cover any open shingles areas with dry dressing. Verbalizes understanding.  No questions at this time.  Assisted off unit via WC.

## 2015-06-07 NOTE — Progress Notes (Signed)
PROGRESS NOTE  CLORINE MOHABIR [id number] DOB: [birth year] DOA: 06/06/2015 PCP: Wende Neighbors, MD  Brief Narrative: 82 year old woman presented with history of shingles, started on valacyclovir subsequent hallucinations, confusion and generalized weakness. Medication was stopped with improvement in mental status but not resolution therefore came to emergency department. CT head was negative, exam nonfocal, altered mental status was thought to be secondary to undigested valacyclovir dose in the setting of chronic kidney disease stage IV. Possibly complicated by UTI. Admitted for observation.   Assessment/Plan: Acute encephalopathy with hallucination, resolved. Secondary to undigested valacyclovir dosing in the setting of chronic kidney disease. Possibly contributed to by urinary tract infection although less likely. CT head no acute disease. UTI, urine culture pending. Daughter reported difficulty urinating at home. CKD Stage IV, appears to be at baseline. Chronic normocytic anemia at baseline. Minimal troponin elevation in setting of CKD. Asymptomatic. No further evaluation suggested. 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    Patient has returned to baseline per daughter. She has some mild memory loss. No evidence of stroke. Her history and clinical improvement off valacyclovir highly suggestive of toxicity from this medication. Symptomatic UTI but not clearly causing any mental status issues. There are short course of treatment. Discussed further treatment versus withholding of medications for shingles. Daughter favors treatment. Dosing discussed with pharmacy. Daughter desires discharge home today which I think is quite reasonable.  She has tolerated a cephalosporin here.  Murray Hodgkins, MD  [facility] Direct contact:  --Via amion app OR  --[email or web address]; password [email or web address] and click  [email or web address] contact night coverage as above 06/07/2015, 7:03 AM  LOS: 1 day    Consultants:    Procedures:    Antimicrobials:  Levaquin 5/10>>  HPI/Subjective: Better today. No particular problems. Daughter reports mental status much improved, no hallucinations, confusion appears resolved. Rash no change.  Objective: Filed Vitals:   06/06/15 1600 06/06/15 1847 06/06/15 2218 06/07/15 0639  BP: 171/74 189/66 200/83 144/71  Pulse: 81  84 80  Temp:  98 F (36.7 C) 97.6 F (36.4 C) 98.1 F (36.7 C)  TempSrc:  Oral Oral Oral  Resp: 20 20 20 18   Height:  5\' 1"  (1.549 m)    Weight:  47.764 kg (105 lb 4.8 oz)  47.61 kg (104 lb 15.4 oz)  SpO2: 97% 100% 98% 97%   No intake or output data in the 24 hours ending 06/07/15 0703   Filed Weights   06/06/15 1256 06/06/15 1847 06/07/15 0639  Weight: 45.36 kg (100 lb) 47.764 kg (105 lb 4.8 oz) 47.61 kg (104 lb 15.4 oz)    Exam: Constitutional:  . Appears calm and comfortable Eyes:  . PERRL and irises appear normal ENMT:  . external ears, nose appear normal . Hard of hearing Respiratory:  . CTA bilaterally, no w/r/r.  . Respiratory effort normal. No retractions or accessory muscle use Cardiovascular:  . RRR, no m/r/g . No LE extremity edema   Musculoskeletal:  . Normal RUE, LUE, RLE, LLE   o strength and tone normal, no atrophy, no abnormal movements o No tenderness, masses Skin:  . Erythematous, confluent rash under left breast with some blistering, vesicles, wrapping around to back, within one dermatome, does not cross midline. . Nontender to palpation. Neurologic:  . Face appears symmetric Psychiatric:  . Mental status o Mood, affect appropriate o Orientation to person, place, daughter  I have personally reviewed following labs and imaging studies:  BUN has normalized, creatinine improved to baseline 1.58  Troponin was minimally elevated  Hemoglobin stable 10.9, no leukocytosis.  Urinalysis was positive, daughter reported difficulty urinating at home, therefore will treat  TSH  within normal limits  Urine culture pending  CT head no acute disease  Scheduled Meds: . amLODipine  10 mg Oral Daily  . aspirin EC  81 mg Oral Daily  . heparin  5,000 Units Subcutaneous BID  . latanoprost  1 drop Both Eyes QHS  . levofloxacin (LEVAQUIN) IV  250 mg Intravenous Q24H  . levothyroxine  75 mcg Oral Daily  . metoprolol succinate  50 mg Oral Daily  . multivitamin with minerals  1 tablet Oral Daily  . polyethylene glycol  17 g Oral QHS  . sertraline  100 mg Oral Daily  . sodium chloride flush  3 mL Intravenous Q12H   Continuous Infusions: . sodium chloride 75 mL/hr at 06/06/15 2232    Principal Problem:   Acute encephalopathy Active Problems:   Herpes zoster   CKD stage 4 secondary to hypertension ([place])   Volume depletion   Essential hypertension   Blindness due to macular degeneration   DNR (do not resuscitate)   Pyuria   LOS: 1 day    By signing my name below, I, Rennis Harding attest that this documentation has been prepared under the direction and in the presence of Murray Hodgkins, MD Electronically signed: Rennis Harding  06/07/2015   I personally performed the services described in this documentation. All medical record entries made by the scribe were at my direction. I have reviewed the chart and agree that the record reflects my personal performance and is accurate and complete. Murray Hodgkins, MD

## 2015-06-07 NOTE — Care Management Note (Signed)
Case Management Note  Patient Details  Name: LAJUNE BROCKBANK MRN: [id number] Date of Birth: [birth year]  Subjective/Objective:Spoke with Patient and Daughter at the bedside. Patient is from home with family and has been living with the son and daughter for last 10 years. Daughter stated that the patient  Has multiple walkers, BSC ,elevated toilet seat and other DME . Not on Home O2. Has had HH with [facility] before and would like to continue. HH referrall placed with Romualdo Bolk at [place].                Action/Plan: Home with Home Health and family support,   Expected Discharge Date:  06/09/15               Expected Discharge Plan:  [place]  In-House Referral:     Discharge planning Services  CM Consult  Post Acute Care Choice:    Choice offered to:  Adult Children  DME Arranged:    DME Agency:     HH Arranged:  PT HH Agency:  [facility]  Status of Service:  Completed, signed off  Medicare Important Message Given:    Date Medicare IM Given:    Medicare IM give by:    Date Additional Medicare IM Given:    Additional Medicare Important Message give by:     If discussed at [place] of Stay Meetings, dates discussed:    Additional Comments:  Alvie Heidelberg, RN 06/07/2015, 1:43 PM

## 2015-06-07 NOTE — Progress Notes (Signed)
Pharmacy Antibiotic Note  [name] [name] is a 82 y.o. female admitted on [id number] with uncomplicated shingles.  Pharmacy has been consulted for ACYCLOVIR dosing, renal adjustment.  Plan: The dose of ACYCLOVIR will be adjusted to 800MG  PO Q8HRS based on renal function.  Height: 5\' 1"  (154.9 cm) Weight: 104 lb 15.4 oz (47.61 kg) IBW/kg (Calculated) : 47.8  Temp (24hrs), Avg:98 F (36.7 C), Min:97.6 F (36.4 C), Max:98.1 F (36.7 C)   Recent Labs Lab 06/06/15 1404 06/07/15 0528  WBC 4.2 3.9*  CREATININE 1.89* 1.58*    Estimated Creatinine Clearance: 14.9 mL/min (by C-G formula based on Cr of 1.58).    Allergies  Allergen Reactions  . Neosporin [Neomycin-Bacitracin Zn-Polymyx] Rash  . Nitrofurantoin Monohyd Macro Nausea And Vomiting and Other (See Comments)    Fever, chills  . Penicillins Rash    Has patient had a PCN reaction causing immediate rash, facial/tongue/throat swelling, SOB or lightheadedness with hypotension: Yes Has patient had a PCN reaction causing severe rash involving mucus membranes or skin necrosis: No Has patient had a PCN reaction that required hospitalization No Has patient had a PCN reaction occurring within the last 10 years: No If all of the above answers are "NO", then may proceed with Cephalosporin use.    Antimicrobials this admission: 5/11 acyclovir >>  5/11 cefuroxime >>  5/10 Rocephin x 1 5/10 Levaquin x 1  Microbiology results: 5/10 BCx: pending 10/10 UCx: pending   Thank you for allowing pharmacy to be a part of this patient's care.  Hart Robinsons A 06/07/2015 12:32 PM

## 2015-06-07 NOTE — Discharge Summary (Signed)
Physician Discharge Summary  [name] [name] [id number] DOB: [birth year] DOA: 06/06/2015  PCP: Wende Neighbors, MD  Admit date: 06/06/2015 Discharge date: 06/07/2015  Recommendations for Outpatient Follow-up:   Follow-up resolution of shingles left chest wall  Follow-up Information    Follow up with Wende Neighbors, MD. Schedule an appointment as soon as possible for a visit in 1 week.   Specialty:  Internal Medicine   Contact information:   [place] [address] [phone]      Discharge Diagnoses:  1. Acute encephalopathy with hallucination. 2. Shingles left chest wall without apparent complicating features 3. UTI.  4. CKD Stage IV. 5. Chronic normocytic anemia.  Discharge Condition: Improved.  Disposition: Discharge home with HHPT  Diet recommendation: heart healthy   Filed Weights   06/06/15 1256 06/06/15 1847 06/07/15 0639  Weight: 45.36 kg (100 lb) 47.764 kg (105 lb 4.8 oz) 47.61 kg (104 lb 15.4 oz)    History of present illness:  82 year old woman presented with history of shingles, started on valacyclovir subsequent hallucinations, confusion and generalized weakness. Medication was stopped with improvement in mental status but not resolution therefore came to emergency department. CT head was negative, exam nonfocal, altered mental status was thought to be secondary to undigested valacyclovir dose in the setting of chronic kidney disease stage IV. Possibly complicated by UTI. Admitted for observation.   Hospital Course:  Patient was observed and had rapid improvement in mental status, returning essentially to baseline consistent with adverse drug reaction to valacyclovir. She also had a history of difficulty urinating at home so urinary tract infection felt to be playing a role. After further discussion with her daughter, requested discharge home given her significant improvement, this appears reasonable. Hospitalization was complicated. Individual issues as  below.  1. Acute encephalopathy with hallucination, resolved. Secondary to undigested valacyclovir dosing in the setting of chronic kidney disease. Possibly contributed to by urinary tract infection although less likely. CT head no acute disease. 2. UTI, urine culture pending. Daughter reported difficulty urinating at home. 3. CKD Stage IV, appears to be at baseline. 4. Chronic normocytic anemia at baseline. 5. Minimal troponin elevation in setting of CKD. Asymptomatic. No further evaluation suggested. 6. 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   Patient has returned to baseline per daughter. She has some mild memory loss. No evidence of stroke. Her history and clinical improvement off valacyclovir highly suggestive of toxicity from this medication. Symptomatic UTI but not clearly causing any mental status issues. Therefore short course of treatment. Discussed further treatment versus withholding of medications for shingles. Daughter favors treatment. Dosing discussed with pharmacy. Daughter desires discharge home today which I think is quite reasonable.  She has tolerated a cephalosporin here.  Discharge Instructions  Discharge Instructions    Diet general    Complete by:  As directed      Discharge instructions    Complete by:  As directed   Call your physician or seek immediate medical attention for confusion, fever, worsening of rash, hallucinations or worsening of condition. Keep wounds covered, dry, avoid touching with bare hands. If hallucinations or confusion worsens, stop acyclovir.     Increase activity slowly    Complete by:  As directed           Discharge Medication List as of 06/07/2015  1:20 PM    START taking these medications   Details  acyclovir (ZOVIRAX) 800 MG tablet Take 1 tablet (800 mg total) by mouth 3 (three) times daily., Starting  06/07/2015, Until Discontinued, Normal    cefUROXime (CEFTIN) 250 MG tablet Take 1 tablet (250 mg total) by mouth daily. Take 5/12  at lunch., Starting 06/07/2015, Until Discontinued, Normal      CONTINUE these medications which have NOT CHANGED   Details  acetaminophen (TYLENOL) 500 MG tablet Take 500 mg by mouth at bedtime as needed for mild pain or moderate pain. , Until Discontinued, Historical Med    amLODipine (NORVASC) 10 MG tablet TAKE ONE TABLET BY MOUTH ONCE DAILY., Normal    aspirin 81 MG tablet Take 81 mg by mouth daily.  , Until Discontinued, Historical Med    darbepoetin alfa-polysorbate (ARANESP, ALBUMIN FREE,) 100 MCG/ML injection Inject 1 mL (100 mcg total) into the skin once., Starting 08/14/2010, Print    docusate sodium (COLACE) 100 MG capsule Take 100 mg by mouth 2 (two) times daily.  , Until Discontinued, Historical Med    hydrochlorothiazide (HYDRODIURIL) 25 MG tablet Take 12.5 mg by mouth daily., Starting 05/07/2015, Until Discontinued, Historical Med    latanoprost (XALATAN) 0.005 % ophthalmic solution Place 1 drop into both eyes at bedtime. , Starting 07/28/2011, Until Discontinued, Historical Med    levothyroxine (SYNTHROID, LEVOTHROID) 75 MCG tablet Take 1 tablet by mouth daily., Starting 04/28/2015, Until Discontinued, Historical Med    metoprolol succinate (TOPROL-XL) 50 MG 24 hr tablet Take 1 tablet (50 mg total) by mouth daily. Take with or immediately following a meal., Starting 06/07/2014, Until Discontinued, Normal    Multiple Vitamins-Minerals (MULTIVITAMIN WITH MINERALS) tablet Take 1 tablet by mouth daily.  , Until Discontinued, Historical Med    nitroGLYCERIN (NITROSTAT) 0.4 MG SL tablet Place 1 tablet (0.4 mg total) under the tongue every 5 (five) minutes as needed for chest pain., Starting 06/07/2014, Until Discontinued, Normal    pantoprazole (PROTONIX) 40 MG tablet Take 40 mg by mouth daily., Until Discontinued, Historical Med    polyethylene glycol (MIRALAX / GLYCOLAX) packet Take 17 g by mouth at bedtime. , Until Discontinued, Historical Med    sertraline (ZOLOFT) 100 MG tablet  Take 100 mg by mouth daily., Until Discontinued, Historical Med    traMADol (ULTRAM) 50 MG tablet Take 50 mg by mouth every 6 (six) hours as needed for moderate pain. , Until Discontinued, Historical Med      STOP taking these medications     valACYclovir (VALTREX) 1000 MG tablet      omeprazole (PRILOSEC) 20 MG capsule        Allergies  Allergen Reactions  . Neosporin [Neomycin-Bacitracin Zn-Polymyx] Rash  . Nitrofurantoin Monohyd Macro Nausea And Vomiting and Other (See Comments)    Fever, chills  . Penicillins Rash    Has patient had a PCN reaction causing immediate rash, facial/tongue/throat swelling, SOB or lightheadedness with hypotension: Yes Has patient had a PCN reaction causing severe rash involving mucus membranes or skin necrosis: No Has patient had a PCN reaction that required hospitalization No Has patient had a PCN reaction occurring within the last 10 years: No If all of the above answers are "NO", then may proceed with Cephalosporin use.     The results of significant diagnostics from this hospitalization (including imaging, microbiology, ancillary and laboratory) are listed below for reference.    Significant Diagnostic Studies: Ct Head Wo Contrast  06/06/2015  CLINICAL DATA:  82 year old female with altered mental status and hallucinations since yesterday. Initial encounter. EXAM: CT HEAD WITHOUT CONTRAST TECHNIQUE: Contiguous axial images were obtained from the base of the skull through the vertex  without intravenous contrast. COMPARISON:  05/04/2015 and earlier. FINDINGS: Chronic sphenoid sinusitis. Increased left sphenoid sinus fluid level since April. Mild maxillary sinus mucosal thickening appears stable. Other Visualized paranasal sinuses and mastoids are clear. Osteopenia No acute osseous abnormality identified. No acute orbit or scalp soft tissue finding. Calcified atherosclerosis at the skull base. Stable cerebral volume. Small chronic lacunar infarct in the  superior left cerebellum is unchanged. Elsewhere gray-Grigorian matter differentiation is normal for age. No midline shift, ventriculomegaly, mass effect, evidence of mass lesion, intracranial hemorrhage or evidence of cortically based acute infarction. IMPRESSION: No acute intracranial abnormality. Stable mild for age chronic small vessel disease. Electronically Signed   By: Genevie Ann M.D.   On: 06/06/2015 15:17   Microbiology: Recent Results (from the past 240 hour(s))  Blood culture (routine x 2)     Status: None (Preliminary result)   Collection Time: 06/06/15  1:52 PM  Result Value Ref Range Status   Specimen Description BLOOD RIGHT ANTECUBITAL  Final   Special Requests BOTTLES DRAWN AEROBIC AND ANAEROBIC 10CC EACH  Final   Culture NO GROWTH < 24 HOURS  Final   Report Status PENDING  Incomplete  Blood culture (routine x 2)     Status: None (Preliminary result)   Collection Time: 06/06/15  1:58 PM  Result Value Ref Range Status   Specimen Description BLOOD LEFT ANTECUBITAL  Final   Special Requests   Final    BOTTLES DRAWN AEROBIC AND ANAEROBIC AEB=10CC ANA=7CC   Culture NO GROWTH < 24 HOURS  Final   Report Status PENDING  Incomplete     Labs: Basic Metabolic Panel:  Recent Labs Lab 06/06/15 1404 06/07/15 0528  NA 136 139  K 3.7 3.4*  CL 100* 106  CO2 28 26  GLUCOSE 113* 84  BUN 26* 20  CREATININE 1.89* 1.58*  CALCIUM 9.5 8.6*   Liver Function Tests:  Recent Labs Lab 06/06/15 1404  AST 26  ALT 11*  ALKPHOS 78  BILITOT 0.4  PROT 7.1  ALBUMIN 3.4*   CBC:  Recent Labs Lab 06/06/15 1404 06/07/15 0528  WBC 4.2 3.9*  NEUTROABS 2.4  --   HGB 11.4* 10.0*  HCT 34.9* 31.0*  MCV 94.6 95.1  PLT 240 203   Cardiac Enzymes:  Recent Labs Lab 06/06/15 1440  TROPONINI 0.05*    Principal Problem:   Acute encephalopathy Active Problems:   Herpes zoster   CKD stage 4 secondary to hypertension (HCC)   Volume depletion   Essential hypertension   Blindness due to  macular degeneration   DNR (do not resuscitate)   Pyuria   Time coordinating discharge: 35 minutes  Signed:  Murray Hodgkins, MD [facility] 06/07/2015, 5:04 PM    By signing my name below, I, Rennis Harding attest that this documentation has been prepared under the direction and in the presence of Murray Hodgkins, MD Electronically signed: Rennis Harding  06/07/2015   I personally performed the services described in this documentation. All medical record entries made by the scribe were at my direction. I have reviewed the chart and agree that the record reflects my personal performance and is accurate and complete. Murray Hodgkins, MD

## 2015-06-09 DIAGNOSIS — H3530 Unspecified macular degeneration: Secondary | ICD-10-CM | POA: Diagnosis not present

## 2015-06-09 DIAGNOSIS — B029 Zoster without complications: Secondary | ICD-10-CM | POA: Diagnosis not present

## 2015-06-09 DIAGNOSIS — N390 Urinary tract infection, site not specified: Secondary | ICD-10-CM | POA: Diagnosis not present

## 2015-06-09 DIAGNOSIS — T375X5D Adverse effect of antiviral drugs, subsequent encounter: Secondary | ICD-10-CM | POA: Diagnosis not present

## 2015-06-09 DIAGNOSIS — D500 Iron deficiency anemia secondary to blood loss (chronic): Secondary | ICD-10-CM | POA: Diagnosis not present

## 2015-06-09 DIAGNOSIS — I129 Hypertensive chronic kidney disease with stage 1 through stage 4 chronic kidney disease, or unspecified chronic kidney disease: Secondary | ICD-10-CM | POA: Diagnosis not present

## 2015-06-09 DIAGNOSIS — H540 Blindness, both eyes: Secondary | ICD-10-CM | POA: Diagnosis not present

## 2015-06-09 DIAGNOSIS — N184 Chronic kidney disease, stage 4 (severe): Secondary | ICD-10-CM | POA: Diagnosis not present

## 2015-06-09 DIAGNOSIS — R443 Hallucinations, unspecified: Secondary | ICD-10-CM | POA: Diagnosis not present

## 2015-06-09 DIAGNOSIS — G9340 Encephalopathy, unspecified: Secondary | ICD-10-CM | POA: Diagnosis not present

## 2015-06-10 LAB — URINE CULTURE: Culture: 40000 — AB

## 2015-06-11 ENCOUNTER — Telehealth: Payer: Self-pay | Admitting: Family Medicine

## 2015-06-11 LAB — CULTURE, BLOOD (ROUTINE X 2)
CULTURE: NO GROWTH
Culture: NO GROWTH

## 2015-06-11 NOTE — Telephone Encounter (Signed)
Urine culture with Pseudomonas.  Discussed with daughter by telephone today, patient is doing well, "back to herself mentally... Urinating the best she has in a long time".  Still struggling with pain from shingles.  Discussed with daughter. Given that the patient appears to be asymptomatic would consider this asymptomatic bacteriuria and not treat at this time.  Murray Hodgkins, MD

## 2015-06-16 ENCOUNTER — Observation Stay (HOSPITAL_COMMUNITY)
Admission: EM | Admit: 2015-06-16 | Discharge: 2015-06-18 | Disposition: A | Discharge status: Home / Self Care | Payer: Medicare Other | Attending: Internal Medicine | Admitting: Internal Medicine

## 2015-06-16 ENCOUNTER — Encounter (HOSPITAL_COMMUNITY): Payer: Self-pay | Admitting: Emergency Medicine

## 2015-06-16 ENCOUNTER — Emergency Department (HOSPITAL_COMMUNITY): Payer: Medicare Other

## 2015-06-16 DIAGNOSIS — Z79899 Other long term (current) drug therapy: Secondary | ICD-10-CM | POA: Diagnosis not present

## 2015-06-16 DIAGNOSIS — I129 Hypertensive chronic kidney disease with stage 1 through stage 4 chronic kidney disease, or unspecified chronic kidney disease: Secondary | ICD-10-CM | POA: Diagnosis not present

## 2015-06-16 DIAGNOSIS — Z7982 Long term (current) use of aspirin: Secondary | ICD-10-CM | POA: Diagnosis not present

## 2015-06-16 DIAGNOSIS — H540 Blindness, both eyes: Secondary | ICD-10-CM

## 2015-06-16 DIAGNOSIS — R4182 Altered mental status, unspecified: Secondary | ICD-10-CM | POA: Diagnosis not present

## 2015-06-16 DIAGNOSIS — M810 Age-related osteoporosis without current pathological fracture: Secondary | ICD-10-CM | POA: Diagnosis not present

## 2015-06-16 DIAGNOSIS — J449 Chronic obstructive pulmonary disease, unspecified: Secondary | ICD-10-CM | POA: Insufficient documentation

## 2015-06-16 DIAGNOSIS — I6789 Other cerebrovascular disease: Secondary | ICD-10-CM | POA: Diagnosis not present

## 2015-06-16 DIAGNOSIS — N184 Chronic kidney disease, stage 4 (severe): Secondary | ICD-10-CM

## 2015-06-16 DIAGNOSIS — G459 Transient cerebral ischemic attack, unspecified: Secondary | ICD-10-CM | POA: Diagnosis not present

## 2015-06-16 DIAGNOSIS — I10 Essential (primary) hypertension: Secondary | ICD-10-CM | POA: Diagnosis not present

## 2015-06-16 DIAGNOSIS — I359 Nonrheumatic aortic valve disorder, unspecified: Secondary | ICD-10-CM | POA: Diagnosis not present

## 2015-06-16 DIAGNOSIS — H547 Unspecified visual loss: Secondary | ICD-10-CM | POA: Diagnosis present

## 2015-06-16 DIAGNOSIS — B029 Zoster without complications: Secondary | ICD-10-CM | POA: Diagnosis present

## 2015-06-16 DIAGNOSIS — F4489 Other dissociative and conversion disorders: Secondary | ICD-10-CM | POA: Diagnosis not present

## 2015-06-16 DIAGNOSIS — R531 Weakness: Secondary | ICD-10-CM | POA: Diagnosis not present

## 2015-06-16 DIAGNOSIS — R4781 Slurred speech: Secondary | ICD-10-CM | POA: Diagnosis not present

## 2015-06-16 DIAGNOSIS — R29810 Facial weakness: Secondary | ICD-10-CM | POA: Diagnosis not present

## 2015-06-16 HISTORY — DX: Transient cerebral ischemic attack, unspecified: G45.9

## 2015-06-16 HISTORY — DX: Cerebral infarction, unspecified: I63.9

## 2015-06-16 HISTORY — DX: Nonrheumatic aortic (valve) stenosis: I35.0

## 2015-06-16 LAB — COMPREHENSIVE METABOLIC PANEL
ALK PHOS: 62 U/L (ref 38–126)
ALT: 11 U/L — ABNORMAL LOW (ref 14–54)
ANION GAP: 9 (ref 5–15)
AST: 25 U/L (ref 15–41)
Albumin: 3.4 g/dL — ABNORMAL LOW (ref 3.5–5.0)
BILIRUBIN TOTAL: 0.3 mg/dL (ref 0.3–1.2)
BUN: 26 mg/dL — ABNORMAL HIGH (ref 6–20)
CALCIUM: 9.7 mg/dL (ref 8.9–10.3)
CO2: 29 mmol/L (ref 22–32)
CREATININE: 1.71 mg/dL — AB (ref 0.44–1.00)
Chloride: 98 mmol/L — ABNORMAL LOW (ref 101–111)
GFR calc Af Amer: 27 mL/min — ABNORMAL LOW (ref >60)
GFR, EST NON AFRICAN AMERICAN: 24 mL/min — AB (ref >60)
GLUCOSE: 138 mg/dL — AB (ref 65–99)
Potassium: 3.5 mmol/L (ref 3.5–5.1)
Sodium: 136 mmol/L (ref 135–145)
Total Protein: 6.6 g/dL (ref 6.5–8.1)

## 2015-06-16 LAB — DIFFERENTIAL
Basophils Absolute: 0 10*3/uL (ref 0.0–0.1)
Basophils Relative: 0 %
EOS ABS: 0.1 10*3/uL (ref 0.0–0.7)
EOS PCT: 2 %
LYMPHS ABS: 1.1 10*3/uL (ref 0.7–4.0)
LYMPHS PCT: 19 %
MONO ABS: 0.3 10*3/uL (ref 0.1–1.0)
MONOS PCT: 6 %
Neutro Abs: 4.3 10*3/uL (ref 1.7–7.7)
Neutrophils Relative %: 73 %

## 2015-06-16 LAB — URINALYSIS, ROUTINE W REFLEX MICROSCOPIC
Bilirubin Urine: NEGATIVE
Glucose, UA: NEGATIVE mg/dL
Ketones, ur: NEGATIVE mg/dL
Nitrite: NEGATIVE
SPECIFIC GRAVITY, URINE: 1.02 (ref 1.005–1.030)
pH: 6 (ref 5.0–8.0)

## 2015-06-16 LAB — I-STAT CHEM 8, ED
BUN: 24 mg/dL — AB (ref 6–20)
CALCIUM ION: 1.32 mmol/L — AB (ref 1.13–1.30)
CREATININE: 1.7 mg/dL — AB (ref 0.44–1.00)
Chloride: 97 mmol/L — ABNORMAL LOW (ref 101–111)
Glucose, Bld: 132 mg/dL — ABNORMAL HIGH (ref 65–99)
HEMATOCRIT: 35 % — AB (ref 36.0–46.0)
HEMOGLOBIN: 11.9 g/dL — AB (ref 12.0–15.0)
Potassium: 3.6 mmol/L (ref 3.5–5.1)
SODIUM: 139 mmol/L (ref 135–145)
TCO2: 28 mmol/L (ref 0–100)

## 2015-06-16 LAB — URINE MICROSCOPIC-ADD ON

## 2015-06-16 LAB — APTT: aPTT: 24 seconds (ref 24–37)

## 2015-06-16 LAB — I-STAT TROPONIN, ED: Troponin i, poc: 0.02 ng/mL (ref 0.00–0.08)

## 2015-06-16 LAB — CBC
HCT: 33.2 % — ABNORMAL LOW (ref 36.0–46.0)
HEMOGLOBIN: 10.8 g/dL — AB (ref 12.0–15.0)
MCH: 30.9 pg (ref 26.0–34.0)
MCHC: 32.5 g/dL (ref 30.0–36.0)
MCV: 94.9 fL (ref 78.0–100.0)
Platelets: 284 10*3/uL (ref 150–400)
RBC: 3.5 MIL/uL — AB (ref 3.87–5.11)
RDW: 14.3 % (ref 11.5–15.5)
WBC: 5.9 10*3/uL (ref 4.0–10.5)

## 2015-06-16 LAB — ETHANOL: Alcohol, Ethyl (B): <5 mg/dL (ref <5)

## 2015-06-16 LAB — PROTIME-INR
INR: 0.97 (ref 0.00–1.49)
Prothrombin Time: 13.1 seconds (ref 11.6–15.2)

## 2015-06-16 MED ORDER — SERTRALINE HCL 50 MG PO TABS
100.0000 mg | ORAL_TABLET | Freq: Every day | ORAL | Status: DC
  Administered 2015-06-17 – 2015-06-18 (×2): 100 mg via ORAL
  Filled 2015-06-16 (×3): qty 2

## 2015-06-16 MED ORDER — ENSURE ENLIVE PO LIQD
237.0000 mL | Freq: Two times a day (BID) | ORAL | Status: DC
  Administered 2015-06-17 (×2): 237 mL via ORAL

## 2015-06-16 MED ORDER — LEVOTHYROXINE SODIUM 75 MCG PO TABS
75.0000 ug | ORAL_TABLET | Freq: Every day | ORAL | Status: DC
  Administered 2015-06-17 – 2015-06-18 (×2): 75 ug via ORAL
  Filled 2015-06-16 (×3): qty 1

## 2015-06-16 MED ORDER — POLYETHYLENE GLYCOL 3350 17 G PO PACK
17.0000 g | PACK | Freq: Every day | ORAL | Status: DC
  Administered 2015-06-16 – 2015-06-17 (×2): 17 g via ORAL
  Filled 2015-06-16 (×2): qty 1

## 2015-06-16 MED ORDER — STROKE: EARLY STAGES OF RECOVERY BOOK
Freq: Once | Status: AC
  Administered 2015-06-18: 13:00:00
  Filled 2015-06-16: qty 1

## 2015-06-16 MED ORDER — ACETAMINOPHEN 500 MG PO TABS
500.0000 mg | ORAL_TABLET | Freq: Every evening | ORAL | Status: DC | PRN
  Administered 2015-06-17: 500 mg via ORAL
  Filled 2015-06-16 (×2): qty 1

## 2015-06-16 MED ORDER — DOCUSATE SODIUM 100 MG PO CAPS
100.0000 mg | ORAL_CAPSULE | Freq: Two times a day (BID) | ORAL | Status: DC
  Administered 2015-06-16 – 2015-06-18 (×4): 100 mg via ORAL
  Filled 2015-06-16 (×4): qty 1

## 2015-06-16 MED ORDER — LATANOPROST 0.005 % OP SOLN
OPHTHALMIC | Status: AC
  Filled 2015-06-16: qty 2.5

## 2015-06-16 MED ORDER — HEPARIN SODIUM (PORCINE) 5000 UNIT/ML IJ SOLN
5000.0000 [IU] | Freq: Three times a day (TID) | INTRAMUSCULAR | Status: DC
  Administered 2015-06-16 – 2015-06-18 (×5): 5000 [IU] via SUBCUTANEOUS
  Filled 2015-06-16 (×6): qty 1

## 2015-06-16 MED ORDER — DARBEPOETIN ALFA-POLYSORBATE 100 MCG/ML IJ SOLN: 100.0000 ug | Freq: Once | INTRAMUSCULAR | Status: DC

## 2015-06-16 MED ORDER — LATANOPROST 0.005 % OP SOLN
1.0000 [drp] | Freq: Every day | OPHTHALMIC | Status: DC
  Administered 2015-06-16 – 2015-06-17 (×2): 1 [drp] via OPHTHALMIC
  Filled 2015-06-16: qty 2.5

## 2015-06-16 MED ORDER — PANTOPRAZOLE SODIUM 40 MG PO TBEC
40.0000 mg | DELAYED_RELEASE_TABLET | Freq: Every day | ORAL | Status: DC
  Administered 2015-06-17 – 2015-06-18 (×2): 40 mg via ORAL
  Filled 2015-06-16 (×3): qty 1

## 2015-06-16 MED ORDER — AMLODIPINE BESYLATE 5 MG PO TABS
10.0000 mg | ORAL_TABLET | Freq: Every day | ORAL | Status: DC
  Administered 2015-06-17 – 2015-06-18 (×2): 10 mg via ORAL
  Filled 2015-06-16 (×3): qty 2

## 2015-06-16 MED ORDER — METOPROLOL SUCCINATE ER 50 MG PO TB24
100.0000 mg | ORAL_TABLET | Freq: Every evening | ORAL | Status: DC
  Administered 2015-06-16 – 2015-06-17 (×2): 100 mg via ORAL
  Filled 2015-06-16 (×2): qty 2

## 2015-06-16 MED ORDER — PREGABALIN 25 MG PO CAPS
25.0000 mg | ORAL_CAPSULE | Freq: Every day | ORAL | Status: DC
Start: 2015-06-16 — End: 2015-06-18
  Administered 2015-06-16 – 2015-06-18 (×3): 25 mg via ORAL
  Filled 2015-06-16 (×4): qty 1

## 2015-06-16 MED ORDER — ADULT MULTIVITAMIN W/MINERALS CH
1.0000 | ORAL_TABLET | Freq: Every day | ORAL | Status: DC
  Administered 2015-06-16 – 2015-06-18 (×3): 1 via ORAL
  Filled 2015-06-16 (×3): qty 1

## 2015-06-16 MED ORDER — ASPIRIN EC 81 MG PO TBEC
81.0000 mg | DELAYED_RELEASE_TABLET | Freq: Every day | ORAL | Status: DC
  Administered 2015-06-16 – 2015-06-18 (×3): 81 mg via ORAL
  Filled 2015-06-16 (×5): qty 1

## 2015-06-16 MED ORDER — MULTI-VITAMIN/MINERALS PO TABS: 1.0000 | ORAL_TABLET | Freq: Every day | ORAL | Status: DC

## 2015-06-16 NOTE — ED Notes (Signed)
This RN and two techs attempted to in and out cath patient. Unable to obtain a urine specimen after 3 attempts. Family requested to wait. Pt states she will notify staff when she has to urinate. States that when she had her episode on the toilet, she urinated a lot.

## 2015-06-16 NOTE — ED Notes (Signed)
Phlebotomy at bedside.

## 2015-06-16 NOTE — H&P (Signed)
[facility] History and Physical  [name] [name] [id number] DOB: July 31, 1914    PCP:   Wende Neighbors, MD   Chief Complaint: aphasia and left sided weakness for 30 minutes.   HPI: [name] [name] is an left handed 82 y.o. female with hx prior TIA, COPD, moderate AS, HTN, hx of carotid stenosis, lives at home with her daughter and family, developed shingle about 3 weeks ago, took Acyclovir with reaction (delirum and confusion), developed post herpetic neuralgia, given one pill of Neurontin at 100mg  last night, and brought to the ER with 30 minutes of aphasia and left sided weakness for about 30 minutes.  Her symptoms resolved in the ER and TPA was not given.  Code stroke cancelled.  Work up included a head CT showing no acute process, EKG showed NSR, and serology showed normal WBC with Cr of 1.7.  Hospitalist was asked to admit her for TIA work up.   Rewiew of Systems:  Constitutional: Negative for malaise, fever and chills. No significant weight loss or weight gain Eyes: Negative for eye pain, redness and discharge, diplopia, visual changes, or flashes of light. ENMT: Negative for ear pain, hoarseness, nasal congestion, sinus pressure and sore throat. No headaches; tinnitus, drooling, or problem swallowing. Cardiovascular: Negative for chest pain, palpitations, diaphoresis, dyspnea and peripheral edema. ; No orthopnea, PND Respiratory: Negative for cough, hemoptysis, wheezing and stridor. No pleuritic chestpain. Gastrointestinal: Negative for nausea, vomiting, diarrhea, constipation, abdominal pain, melena, blood in stool, hematemesis, jaundice and rectal bleeding.    Genitourinary: Negative for frequency, dysuria, incontinence,flank pain and hematuria; Musculoskeletal: Negative for back pain and neck pain. Negative for swelling and trauma.;  Skin: . Negative for pruritus, rash, abrasions, bruising and skin lesion.; ulcerations Neuro: Negative for headache, lightheadedness and neck stiffness.  Negative for weakness, altered level of consciousness , altered mental status, extremity weakness, burning feet, involuntary movement, seizure and syncope.  Psych: negative for anxiety, depression, insomnia, tearfulness, panic attacks, hallucinations, paranoia, suicidal or homicidal ideation    Past Medical History  Diagnosis Date  . Backache, unspecified   . Dizziness and giddiness   . Thyrotoxicosis without mention of goiter or other cause, without mention of thyrotoxic crisis or storm   . Unspecified disorder resulting from impaired renal function   . Anemia, unspecified   . Unspecified essential hypertension   . COPD (chronic obstructive pulmonary disease) ([place])   . Osteoporosis   . TIA (transient ischemic attack)   . Stroke ([place])     Tia  . Aortic stenosis     Past Surgical History  Procedure Laterality Date  . Small bowel obst    . Salivary gland surgery Left   . Back surgery      Kyphoplasty    Medications:  HOME MEDS: Prior to Admission medications   Medication Sig Start Date End Date Taking? Authorizing Provider  acetaminophen (TYLENOL) 500 MG tablet Take 500 mg by mouth at bedtime as needed for mild pain or moderate pain.    Yes Historical Provider, MD  amLODipine (NORVASC) 10 MG tablet TAKE ONE TABLET BY MOUTH ONCE DAILY. 04/30/15  Yes Arnoldo Lenis, MD  aspirin 81 MG tablet Take 81 mg by mouth daily.     Yes Historical Provider, MD  darbepoetin alfa-polysorbate (ARANESP, ALBUMIN FREE,) 100 MCG/ML injection Inject 1 mL (100 mcg total) into the skin once. 08/14/10  Yes Fran Lowes, MD  docusate sodium (COLACE) 100 MG capsule Take 100 mg by mouth 2 (two) times daily.  Yes Historical Provider, MD  gabapentin (NEURONTIN) 100 MG capsule Take 1 capsule by mouth at bedtime. 06/15/15  Yes Historical Provider, MD  hydrochlorothiazide (HYDRODIURIL) 25 MG tablet Take 12.5 mg by mouth daily. 05/07/15  Yes Historical Provider, MD  latanoprost (XALATAN) 0.005 % ophthalmic  solution Place 1 drop into both eyes at bedtime.  07/28/11  Yes Historical Provider, MD  levothyroxine (SYNTHROID, LEVOTHROID) 75 MCG tablet Take 1 tablet by mouth daily. 04/28/15  Yes Historical Provider, MD  metoprolol succinate (TOPROL-XL) 50 MG 24 hr tablet Take 1 tablet (50 mg total) by mouth daily. Take with or immediately following a meal. Patient taking differently: Take 100 mg by mouth every evening. Take with or immediately following a meal. 06/07/14  Yes Arnoldo Lenis, MD  Multiple Vitamins-Minerals (MULTIVITAMIN WITH MINERALS) tablet Take 1 tablet by mouth daily.     Yes Historical Provider, MD  nitroGLYCERIN (NITROSTAT) 0.4 MG SL tablet Place 1 tablet (0.4 mg total) under the tongue every 5 (five) minutes as needed for chest pain. 06/07/14  Yes Arnoldo Lenis, MD  pantoprazole (PROTONIX) 40 MG tablet Take 40 mg by mouth daily.   Yes Historical Provider, MD  polyethylene glycol (MIRALAX / GLYCOLAX) packet Take 17 g by mouth at bedtime.    Yes Historical Provider, MD  sertraline (ZOLOFT) 100 MG tablet Take 100 mg by mouth daily.   Yes Historical Provider, MD  acyclovir (ZOVIRAX) 800 MG tablet Take 1 tablet (800 mg total) by mouth 3 (three) times daily. Patient not taking: Reported on 06/16/2015 06/07/15   [name], MD  cefUROXime (CEFTIN) 250 MG tablet Take 1 tablet (250 mg total) by mouth daily. Take 5/12 at lunch. Patient not taking: Reported on 06/16/2015 06/07/15   [name], MD     Allergies:  Allergies  Allergen Reactions  . Neosporin [Neomycin-Bacitracin Zn-Polymyx] Rash  . Nitrofurantoin Monohyd Macro Nausea And Vomiting and Other (See Comments)    Fever, chills  . Valtrex [Valacyclovir Hcl] Other (See Comments)    Confusion, leg weakness, hallucinations.  . Penicillins Rash    Has patient had a PCN reaction causing immediate rash, facial/tongue/throat swelling, SOB or lightheadedness with hypotension: Yes Has patient had a PCN reaction causing severe rash  involving mucus membranes or skin necrosis: No Has patient had a PCN reaction that required hospitalization No Has patient had a PCN reaction occurring within the last 10 years: No If all of the above answers are "NO", then may proceed with Cephalosporin use.     Social History:   reports that she has never smoked. She has never used smokeless tobacco. She reports that she does not drink alcohol or use illicit drugs.  Family History: History reviewed. No pertinent family history.   Physical Exam: Filed Vitals:   06/16/15 1600 06/16/15 1630 06/16/15 1700 06/16/15 1730  BP: 136/74 149/73 140/72 145/74  Pulse: 70 72 73 74  Temp:      TempSrc:      Resp: 25 24 17 23   SpO2: 100% 100% 99% 99%   Blood pressure 145/74, pulse 74, temperature 97.8 F (36.6 C), temperature source Oral, resp. rate 23, SpO2 99 %.  GEN:  Pleasant  patient lying in the stretcher in no acute distress; cooperative with exam. PSYCH:  alert and oriented x4; does not appear anxious or depressed; affect is appropriate. HEENT: Mucous membranes pink and anicteric; PERRLA; EOM intact; no cervical lymphadenopathy nor thyromegaly or carotid bruit; no JVD; There were no stridor.  Neck is very supple. Breasts:: Not examined CHEST WALL: No tenderness CHEST: Normal respiration, clear to auscultation bilaterally.  HEART: Regular rate and rhythm.  There are no murmur, rub, or gallops.   BACK: No kyphosis or scoliosis; no CVA tenderness ABDOMEN: soft and non-tender; no masses, no organomegaly, normal abdominal bowel sounds; no pannus; no intertriginous candida. There is no rebound and no distention. Rectal Exam: Not done EXTREMITIES: No bone or joint deformity; age-appropriate arthropathy of the hands and knees; no edema; no ulcerations.  There is no calf tenderness. Genitalia: not examined PULSES: 2+ and symmetric SKIN: Normal hydration no rash or ulceration CNS: Cranial nerves 2-12 grossly intact no focal lateralizing  neurologic deficit.  Speech is fluent; uvula elevated with phonation, facial symmetry and tongue midline. DTR are normal bilaterally, cerebella exam is intact, barbinski is negative and strengths are equaled bilaterally.  No sensory loss.   Labs on Admission:  Basic Metabolic Panel:  Recent Labs Lab 06/16/15 1143 06/16/15 1159  NA 136 139  K 3.5 3.6  CL 98* 97*  CO2 29  --   GLUCOSE 138* 132*  BUN 26* 24*  CREATININE 1.71* 1.70*  CALCIUM 9.7  --    Liver Function Tests:  Recent Labs Lab 06/16/15 1143  AST 25  ALT 11*  ALKPHOS 62  BILITOT 0.3  PROT 6.6  ALBUMIN 3.4*   CBC:  Recent Labs Lab 06/16/15 1143 06/16/15 1159  WBC 5.9  --   NEUTROABS 4.3  --   HGB 10.8* 11.9*  HCT 33.2* 35.0*  MCV 94.9  --   PLT 284  --     Radiological Exams on Admission: Ct Head Wo Contrast  06/16/2015  CLINICAL DATA:  Acute mental status change. EXAM: CT HEAD WITHOUT CONTRAST TECHNIQUE: Contiguous axial images were obtained from the base of the skull through the vertex without intravenous contrast. COMPARISON:  Jun 06, 2015 FINDINGS: There is decreased fluid in the sphenoid sinus compared to the previous study. Paranasal sinuses and mastoid air cells are otherwise within normal limits. Visualized bones are normal. Extracranial soft tissues are normal. No subdural, epidural, or subarachnoid hemorrhage. The cerebellum, brainstem, and basal cisterns are stable with no acute abnormalities. Ventricles and sulci are prominent but unchanged. Scattered Kertz matter changes are identified, also unchanged. No acute cortical ischemia or infarct identified on today's study. No mass, mass effect, or midline shift. IMPRESSION: 1. No acute abnormalities. Findings called to Dr. Alvino Chapel at 11:45 a.m. Electronically Signed   By: Dorise Bullion III M.D   On: 06/16/2015 11:51   Dg Chest Portable 1 View  06/16/2015  CLINICAL DATA:  Left-sided weakness. EXAM: PORTABLE CHEST 1 VIEW COMPARISON:  May 14, 2015  FINDINGS: Cardiomegaly. Tortuous thoracic aorta. The increased prominence of the torturous thoracic aorta could be technical given kyphotic positioning and portable technique. No pneumothorax. A vertically oriented density over the right upper chest may be something on the patient, not seen 1 month ago. No pulmonary nodules or masses. No acute infiltrates. No other acute abnormalities. IMPRESSION: Increased tortuosity of the thoracic aorta is probably technical in nature. A vertically oriented density over the right upper chest is probably something on the patient, not seen 1 month ago. A PA and lateral chest x-ray could better evaluate if concern persists. Electronically Signed   By: Dorise Bullion III M.D   On: 06/16/2015 12:33    EKG: Independently reviewed.    Assessment/Plan Present on Admission:  . TIA (transient ischemic attack) . Aortic  valve disorder . Blindness due to macular degeneration . CKD stage 4 secondary to hypertension ([place]) . Herpes zoster . Essential hypertension . Brain TIA  PLAN:  TIA:  I discussed with her daughter and expressed that it is unlikely any intervention will be of benefit to her, but she would like to be admitted for TIA work up.  Will obtain a brain MRI along with ECHO and carotids, though I told family that it is unlikely that she would be a candidate for any intervention.  Will continue with ASA and consider DUAT.  MRI will not be available until Monday, and option for transfer her to [facility] was discussed, but family is agreeable to be admitted and stay until Monday for her tests.    Zoster:  I don't think her Neurontin was the culprit of her symptoms, but will change her to low dose Lyrica.  AS:  Will obtain ECHO.  HTN:  Will continue with her meds.   Other plans as per orders. Code Status: DNR.   Orvan Falconer, MD. FACP [facility] Pager [phone] 7pm to 7am.  06/16/2015, 5:47 PM

## 2015-06-16 NOTE — ED Notes (Signed)
Reports given to Caryl Asp, [place]

## 2015-06-16 NOTE — ED Notes (Signed)
X-ray at bedside

## 2015-06-16 NOTE — ED Notes (Signed)
Hospitalist at bedside 

## 2015-06-16 NOTE — ED Notes (Signed)
Family arrived and is at bedside.  

## 2015-06-16 NOTE — ED Provider Notes (Signed)
CSN: [id number]     Arrival date & time 06/16/15  1109 History  By signing my name below, I, [name] [name], attest that this documentation has been prepared under the direction and in the presence of [name] Belling, MD.  Electronically Signed: Thea [name], ED Scribe. 06/16/2015. 1:52 PM.   Level 5 caveat due to somewhat altered mental status. Chief Complaint  Patient presents with  . Altered Mental Status   The history is provided by the EMS personnel. No language interpreter was used.   HPI Comments:  [name] [name] is a 82 y.o. female with hx of TIA who present to the Emergency Department complaining of change in mental status. Pt was brought from home by EMS. Per EMS, pt was using the bathroom when she had a change in mental status. Pt reports pain to left arm, worse with movement. EMS notes left arm pain is possibly due to shingles. Patient reportedly could not speak and would not move her left side. Has had TIAs in the past but not like this. Poorly sent doing well otherwise.  Past Medical History  Diagnosis Date  . Backache, unspecified   . Dizziness and giddiness   . Thyrotoxicosis without mention of goiter or other cause, without mention of thyrotoxic crisis or storm   . Unspecified disorder resulting from impaired renal function   . Anemia, unspecified   . Unspecified essential hypertension   . COPD (chronic obstructive pulmonary disease) ([place])   . Osteoporosis   . TIA (transient ischemic attack)   . Stroke ([place])     Tia  . Aortic stenosis    Past Surgical History  Procedure Laterality Date  . [name] bowel obst    . Salivary gland surgery Left   . Back surgery      Kyphoplasty   No family history on file. Social History  Substance Use Topics  . Smoking status: Never Smoker   . Smokeless tobacco: Never Used  . Alcohol Use: No   OB History    No data available     Review of Systems  Unable to perform ROS: Mental status change      Allergies  Neosporin;  Nitrofurantoin monohyd macro; Valtrex; and Penicillins  Home Medications   Prior to Admission medications   Medication Sig Start Date End Date Taking? Authorizing Provider  acetaminophen (TYLENOL) 500 MG tablet Take 500 mg by mouth at bedtime as needed for mild pain or moderate pain.    Yes Historical Provider, MD  amLODipine (NORVASC) 10 MG tablet TAKE ONE TABLET BY MOUTH ONCE DAILY. 04/30/15  Yes Arnoldo Lenis, MD  aspirin 81 MG tablet Take 81 mg by mouth daily.     Yes Historical Provider, MD  darbepoetin alfa-polysorbate (ARANESP, ALBUMIN FREE,) 100 MCG/ML injection Inject 1 mL (100 mcg total) into the skin once. 08/14/10  Yes Fran Lowes, MD  docusate sodium (COLACE) 100 MG capsule Take 100 mg by mouth 2 (two) times daily.     Yes Historical Provider, MD  gabapentin (NEURONTIN) 100 MG capsule Take 1 capsule by mouth at bedtime. 06/15/15  Yes Historical Provider, MD  hydrochlorothiazide (HYDRODIURIL) 25 MG tablet Take 12.5 mg by mouth daily. 05/07/15  Yes Historical Provider, MD  latanoprost (XALATAN) 0.005 % ophthalmic solution Place 1 drop into both eyes at bedtime.  07/28/11  Yes Historical Provider, MD  levothyroxine (SYNTHROID, LEVOTHROID) 75 MCG tablet Take 1 tablet by mouth daily. 04/28/15  Yes Historical Provider, MD  metoprolol succinate (TOPROL-XL) 50 MG  24 hr tablet Take 1 tablet (50 mg total) by mouth daily. Take with or immediately following a meal. Patient taking differently: Take 100 mg by mouth every evening. Take with or immediately following a meal. 06/07/14  Yes Arnoldo Lenis, MD  Multiple Vitamins-Minerals (MULTIVITAMIN WITH MINERALS) tablet Take 1 tablet by mouth daily.     Yes Historical Provider, MD  nitroGLYCERIN (NITROSTAT) 0.4 MG SL tablet Place 1 tablet (0.4 mg total) under the tongue every 5 (five) minutes as needed for chest pain. 06/07/14  Yes Arnoldo Lenis, MD  pantoprazole (PROTONIX) 40 MG tablet Take 40 mg by mouth daily.   Yes Historical Provider, MD   polyethylene glycol (MIRALAX / GLYCOLAX) packet Take 17 g by mouth at bedtime.    Yes Historical Provider, MD  sertraline (ZOLOFT) 100 MG tablet Take 100 mg by mouth daily.   Yes Historical Provider, MD  acyclovir (ZOVIRAX) 800 MG tablet Take 1 tablet (800 mg total) by mouth 3 (three) times daily. Patient not taking: Reported on 06/16/2015 06/07/15   [name] Cota, MD  cefUROXime (CEFTIN) 250 MG tablet Take 1 tablet (250 mg total) by mouth daily. Take 5/12 at lunch. Patient not taking: Reported on 06/16/2015 06/07/15   [name] Cota, MD   BP 121/75 mmHg  Pulse 81  Temp(Src) 97.5 F (36.4 C) (Oral)  Resp 19  SpO2 99% Physical Exam  Constitutional: She appears well-developed and well-nourished.  HENT:  Left Ear: External ear normal.  Neck: Neck supple.  Cardiovascular: Normal rate.   Pulmonary/Chest: Effort normal.  Abdominal: Soft.  Neurological: She is alert.  Patient with mild confusion. Good grip strength bilaterally. Move all extremities. Able answer questions. Face symmetric.  Skin: Skin is warm.    ED Course  Procedures (including critical care time) DIAGNOSTIC STUDIES: Oxygen Saturation is 100% on RA, normal by my interpretation.    COORDINATION OF CARE: 1:52 PM- Pt advised of plan for treatment including and pt agrees.   Labs Review Labs Reviewed  CBC - Abnormal; Notable for the following:    RBC 3.50 (*)    Hemoglobin 10.8 (*)    HCT 33.2 (*)    All other components within normal limits  COMPREHENSIVE METABOLIC PANEL - Abnormal; Notable for the following:    Chloride 98 (*)    Glucose, Bld 138 (*)    BUN 26 (*)    Creatinine, Ser 1.71 (*)    Albumin 3.4 (*)    ALT 11 (*)    GFR calc non Af Amer 24 (*)    GFR calc Af Amer 27 (*)    All other components within normal limits  I-STAT CHEM 8, ED - Abnormal; Notable for the following:    Chloride 97 (*)    BUN 24 (*)    Creatinine, Ser 1.70 (*)    Glucose, Bld 132 (*)    Calcium, Ion 1.32 (*)     Hemoglobin 11.9 (*)    HCT 35.0 (*)    All other components within normal limits  ETHANOL  PROTIME-INR  APTT  DIFFERENTIAL  URINE RAPID DRUG SCREEN, HOSP PERFORMED  URINALYSIS, ROUTINE W REFLEX MICROSCOPIC (NOT AT [facility])  I-STAT TROPOININ, ED    Imaging Review Ct Head Wo Contrast  06/16/2015  CLINICAL DATA:  Acute mental status change. EXAM: CT HEAD WITHOUT CONTRAST TECHNIQUE: Contiguous axial images were obtained from the base of the skull through the vertex without intravenous contrast. COMPARISON:  Jun 06, 2015 FINDINGS: There is  decreased fluid in the sphenoid sinus compared to the previous study. Paranasal sinuses and mastoid air cells are otherwise within normal limits. Visualized bones are normal. Extracranial soft tissues are normal. No subdural, epidural, or subarachnoid hemorrhage. The cerebellum, brainstem, and basal cisterns are stable with no acute abnormalities. Ventricles and sulci are prominent but unchanged. Scattered Dech matter changes are identified, also unchanged. No acute cortical ischemia or infarct identified on today's study. No mass, mass effect, or midline shift. IMPRESSION: 1. No acute abnormalities. Findings called to Dr. Alvino Chapel at 11:45 a.m. Electronically Signed   By: Dorise Bullion III M.D   On: 06/16/2015 11:51   Dg Chest Portable 1 View  06/16/2015  CLINICAL DATA:  Left-sided weakness. EXAM: PORTABLE CHEST 1 VIEW COMPARISON:  May 14, 2015 FINDINGS: Cardiomegaly. Tortuous thoracic aorta. The increased prominence of the torturous thoracic aorta could be technical given kyphotic positioning and portable technique. No pneumothorax. A vertically oriented density over the right upper chest may be something on the patient, not seen 1 month ago. No pulmonary nodules or masses. No acute infiltrates. No other acute abnormalities. IMPRESSION: Increased tortuosity of the thoracic aorta is probably technical in nature. A vertically oriented density over the right upper  chest is probably something on the patient, not seen 1 month ago. A [name] and lateral chest x-ray could better evaluate if concern persists. Electronically Signed   By: Dorise Bullion III M.D   On: 06/16/2015 12:33   I have personally reviewed and evaluated these images and lab results as part of my medical decision-making.   EKG Interpretation   Date/Time:  Saturday Jun 16 2015 11:38:12 EDT Ventricular Rate:  72 PR Interval:  193 QRS Duration: 86 QT Interval:  452 QTC Calculation: 495 R Axis:   -29 Text Interpretation:  Sinus rhythm Abnormal R-wave progression, early  transition LVH with secondary repolarization abnormality Borderline  prolonged QT interval Confirmed by Alvino Chapel  MD, Ramla Hase [phone]) on  06/16/2015 11:50:59 AM       12:00 PM-  I talked to patients family. They noted pt took her first dose of Neurontin last night. Pt has vesicular lesion to left chest that wrap around to left back  MDM   Final diagnoses:  Altered mental status, unspecified altered mental status type  Transient cerebral ischemia, unspecified transient cerebral ischemia type    Patient with altered mental status. Had been somewhat focal with difficulty speaking and apparently not moving left side. Upon arrival to the ER patient was moving her left side a code stroke was canceled. Discussed with patient's family later stated that she had had Neurontin for the first time last night for the shingles. She has not had this medicine before. Mental status improved somewhat, however with the focal deficits will admit to internal medicine.    [name] Belling, MD 06/16/15 [phone]

## 2015-06-16 NOTE — ED Notes (Signed)
Called floor since bed had not been approved by charge and had been assigned for 22 min. Secretary states she will notify charge.

## 2015-06-16 NOTE — ED Notes (Signed)
Per EMS, pt from home. Family called out for decrease in level of consciousness. Pt found on toilet with LT sided arm weakness, drooling, and possible slurred speech. EMS reports that upon arrival, pt was alert, but was not verbal. EDP evaluated pt and Code Stroke cancelled. Pt recently treated for shingles on LT shoulder/arm. Pt only complaints of pain on LT arm. Pt has no slurred speech, no weakness, and no facial droop upon assessment.

## 2015-06-16 NOTE — Progress Notes (Signed)
CODE STROKE BEEPER 1105 EXAM BEGUN 1124  ON MOBILE UNIT COMPLETE OFF MOBILE UNIT 11:29 CALLED [facility] TALKED TO STACEY 11:32 SENT FILMS TO SOC 11:38

## 2015-06-17 ENCOUNTER — Observation Stay (HOSPITAL_BASED_OUTPATIENT_CLINIC_OR_DEPARTMENT_OTHER): Payer: Medicare Other

## 2015-06-17 ENCOUNTER — Observation Stay (HOSPITAL_COMMUNITY): Payer: Medicare Other

## 2015-06-17 DIAGNOSIS — G459 Transient cerebral ischemic attack, unspecified: Principal | ICD-10-CM

## 2015-06-17 DIAGNOSIS — I6523 Occlusion and stenosis of bilateral carotid arteries: Secondary | ICD-10-CM | POA: Diagnosis not present

## 2015-06-17 LAB — LIPID PANEL
CHOL/HDL RATIO: 3.6 ratio
Cholesterol: 191 mg/dL (ref 0–200)
HDL: 53 mg/dL (ref >40)
LDL Cholesterol: 116 mg/dL — ABNORMAL HIGH (ref 0–99)
Triglycerides: 112 mg/dL (ref <150)
VLDL: 22 mg/dL (ref 0–40)

## 2015-06-17 LAB — ECHOCARDIOGRAM COMPLETE
Height: 60 in
WEIGHTICAEL: 1594.37 [oz_av]

## 2015-06-17 NOTE — Progress Notes (Signed)
CM communicated with doctor and patient to have evaluation by PT.

## 2015-06-17 NOTE — Care Management Obs Status (Signed)
[facility] NOTIFICATION   Patient Details  Name: [name] [name] MRN: [id number] Date of Birth: [birth year]   Medicare Observation Status Notification Given:  Yes Patient not able to sign for self. Family was able to sign for patient.    Briant Sites, RN 06/17/2015, 6:25 PM

## 2015-06-17 NOTE — Progress Notes (Signed)
PROGRESS NOTE    [name] [name]  [id number] DOB: 1915-01-10 DOA: 06/16/2015 PCP: Wende Neighbors, MD     Brief Narrative:  82 year old woman admitted on 5/20 admitted for aphasia, slurred speech and left-sided weakness. Symptoms resolved after 30 minutes. Admitted for TIA workup.   Assessment & Plan:   Principal Problem:   TIA (transient ischemic attack) Active Problems:   Aortic valve disorder   Herpes zoster   CKD stage 4 secondary to hypertension (HCC)   Essential hypertension   Blindness due to macular degeneration   Brain TIA   TIA -Workup including MRI, echo, Dopplers at this time -Has been started on aspirin for secondary stroke prevention.  Hypertension -Fair control, continue current management.  Chronic kidney disease stage IV -At baseline.   DVT prophylaxis: Heparin Code Status: DO NOT RESUSCITATE Family Communication: Daughter and granddaughter at bedside updated on plan of care and all questions answered Disposition Plan: [facility] for discharge home in 24 hours  Consultants:   None  Procedures:   Echo, Dopplers pending  Antimicrobials:   None    Subjective: Hard of hearing, pleasant, cooperative.  Objective: Filed Vitals:   06/17/15 0700 06/17/15 1100 06/17/15 1327 06/17/15 1500  BP: 143/63 128/55 118/54 120/54  Pulse: 70 70 69   Temp: 97.5 F (36.4 C) 97.7 F (36.5 C) 97.5 F (36.4 C) 97.5 F (36.4 C)  TempSrc: Oral Oral Oral Oral  Resp: 20 18 20 18   Height:      Weight:      SpO2: 96% 98% 98% 97%    Intake/Output Summary (Last 24 hours) at 06/17/15 1917 Last data filed at 06/17/15 1800  Gross per 24 hour  Intake    720 ml  Output      1 ml  Net    719 ml   Filed Weights   06/16/15 1900  Weight: 45.2 kg (99 lb 10.4 oz)    Examination:  General exam: Alert, awake, oriented x 3 Respiratory system: Clear to auscultation. Respiratory effort normal. Cardiovascular system:RRR. Positive systolic ejection murmur best heard at  the right upper sternal border. Gastrointestinal system: Abdomen is nondistended, soft and nontender. No organomegaly or masses felt. Normal bowel sounds heard. Central nervous system: Alert and oriented. No focal neurological deficits. Extremities: No C/C/E, +pedal pulses Skin: No rashes, lesions or ulcers Psychiatry: Judgement and insight appear normal. Mood & affect appropriate.     Data Reviewed: I have personally reviewed following labs and imaging studies  CBC:  Recent Labs Lab 06/16/15 1143 06/16/15 1159  WBC 5.9  --   NEUTROABS 4.3  --   HGB 10.8* 11.9*  HCT 33.2* 35.0*  MCV 94.9  --   PLT 284  --    Basic Metabolic Panel:  Recent Labs Lab 06/16/15 1143 06/16/15 1159  NA 136 139  K 3.5 3.6  CL 98* 97*  CO2 29  --   GLUCOSE 138* 132*  BUN 26* 24*  CREATININE 1.71* 1.70*  CALCIUM 9.7  --    GFR: Estimated Creatinine Clearance: 13.2 mL/min (by C-G formula based on Cr of 1.7). Liver Function Tests:  Recent Labs Lab 06/16/15 1143  AST 25  ALT 11*  ALKPHOS 62  BILITOT 0.3  PROT 6.6  ALBUMIN 3.4*   No results for input(s): LIPASE, AMYLASE in the last 168 hours. No results for input(s): AMMONIA in the last 168 hours. Coagulation Profile:  Recent Labs Lab 06/16/15 1143  INR 0.97   Cardiac Enzymes:  No results for input(s): CKTOTAL, CKMB, CKMBINDEX, TROPONINI in the last 168 hours. BNP (last 3 results) No results for input(s): PROBNP in the last 8760 hours. HbA1C: No results for input(s): HGBA1C in the last 72 hours. CBG: No results for input(s): GLUCAP in the last 168 hours. Lipid Profile:  Recent Labs  06/17/15 0632  CHOL 191  HDL 53  LDLCALC 116*  TRIG 112  CHOLHDL 3.6   Thyroid Function Tests: No results for input(s): TSH, T4TOTAL, FREET4, T3FREE, THYROIDAB in the last 72 hours. Anemia Panel: No results for input(s): VITAMINB12, FOLATE, FERRITIN, TIBC, IRON, RETICCTPCT in the last 72 hours. Urine analysis:    Component Value  Date/Time   COLORURINE YELLOW 06/16/2015 2100   APPEARANCEUR CLEAR 06/16/2015 2100   LABSPEC 1.020 06/16/2015 2100   PHURINE 6.0 06/16/2015 2100   GLUCOSEU NEGATIVE 06/16/2015 2100   HGBUR MODERATE* 06/16/2015 2100   [place] NEGATIVE 06/16/2015 2100   [place] NEGATIVE 06/16/2015 2100   PROTEINUR TRACE* 06/16/2015 2100   UROBILINOGEN 0.2 03/16/2014 1510   NITRITE NEGATIVE 06/16/2015 2100   LEUKOCYTESUR MODERATE* 06/16/2015 2100   Sepsis Labs: @LABRCNTIP (procalcitonin:4,lacticidven:4)  )No results found for this or any previous visit (from the past 240 hour(s)).       Radiology Studies: Ct Head Wo Contrast  06/16/2015  CLINICAL DATA:  Acute mental status change. EXAM: CT HEAD WITHOUT CONTRAST TECHNIQUE: Contiguous axial images were obtained from the base of the skull through the vertex without intravenous contrast. COMPARISON:  Jun 06, 2015 FINDINGS: There is decreased fluid in the sphenoid sinus compared to the previous study. Paranasal sinuses and mastoid air cells are otherwise within normal limits. Visualized bones are normal. Extracranial soft tissues are normal. No subdural, epidural, or subarachnoid hemorrhage. The cerebellum, brainstem, and basal cisterns are stable with no acute abnormalities. Ventricles and sulci are prominent but unchanged. Scattered Gerardo matter changes are identified, also unchanged. No acute cortical ischemia or infarct identified on today's study. No mass, mass effect, or midline shift. IMPRESSION: 1. No acute abnormalities. Findings called to Dr. Alvino Chapel at 11:45 a.m. Electronically Signed   By: Dorise Bullion III M.D   On: 06/16/2015 11:51   US Carotid Bilateral  06/17/2015  CLINICAL DATA:  TIA. EXAM: BILATERAL CAROTID DUPLEX ULTRASOUND TECHNIQUE: Pearline Cables scale imaging, color Doppler and duplex ultrasound were performed of bilateral carotid and vertebral arteries in the neck. COMPARISON:  04/01/2011 carotid Doppler scan. FINDINGS: Criteria:  Quantification of carotid stenosis is based on velocity parameters that correlate the residual internal carotid diameter with NASCET-based stenosis levels, using the diameter of the distal internal carotid lumen as the denominator for stenosis measurement. The following velocity measurements were obtained: RIGHT ICA:  58 cm/sec CCA:  50 cm/sec SYSTOLIC ICA/CCA RATIO:  1.2 DIASTOLIC ICA/CCA RATIO:  0.7 ECA:  89 cm/sec LEFT ICA:  38 cm/sec CCA:  38 cm/sec SYSTOLIC ICA/CCA RATIO:  1.0 DIASTOLIC ICA/CCA RATIO:  1.0 ECA:  33 cm/sec RIGHT CAROTID ARTERY: Mild calcified plaque at the right carotid bulb and bifurcation, with less than 20% proximal right internal carotid artery stenosis by grayscale imaging. No hemodynamically significant right internal carotid artery stenosis by Doppler criteria. RIGHT VERTEBRAL ARTERY:  Normal antegrade flow. LEFT CAROTID ARTERY: There is at least moderate calcified plaque in the visualized proximal left common carotid artery. There is mild calcified plaque at the left carotid bulb and bifurcation with approximately 20% proximal left internal carotid artery stenosis by grayscale imaging. No hemodynamically significant left internal carotid artery stenosis by grayscale  imaging. LEFT VERTEBRAL ARTERY:  Normal antegrade flow. IMPRESSION: 1. Calcified bilateral carotid plaque, with no hemodynamically significant stenosis (0 - 49%). 2. Normal antegrade flow both vertebral arteries. Electronically Signed   By: Ilona Sorrel M.D.   On: 06/17/2015 10:05   Dg Chest Portable 1 View  06/16/2015  CLINICAL DATA:  Left-sided weakness. EXAM: PORTABLE CHEST 1 VIEW COMPARISON:  May 14, 2015 FINDINGS: Cardiomegaly. Tortuous thoracic aorta. The increased prominence of the torturous thoracic aorta could be technical given kyphotic positioning and portable technique. No pneumothorax. A vertically oriented density over the right upper chest may be something on the patient, not seen 1 month ago. No pulmonary  nodules or masses. No acute infiltrates. No other acute abnormalities. IMPRESSION: Increased tortuosity of the thoracic aorta is probably technical in nature. A vertically oriented density over the right upper chest is probably something on the patient, not seen 1 month ago. A PA and lateral chest x-ray could better evaluate if concern persists. Electronically Signed   By: Dorise Bullion III M.D   On: 06/16/2015 12:33        Scheduled Meds: .  stroke: mapping our early stages of recovery book   Does not apply Once  . amLODipine  10 mg Oral Daily  . aspirin EC  81 mg Oral Daily  . docusate sodium  100 mg Oral BID  . feeding supplement (ENSURE ENLIVE)  237 mL Oral BID BM  . heparin  5,000 Units Subcutaneous Q8H  . latanoprost  1 drop Both Eyes QHS  . levothyroxine  75 mcg Oral QAC breakfast  . metoprolol succinate  100 mg Oral QPM  . multivitamin with minerals  1 tablet Oral Daily  . pantoprazole  40 mg Oral Daily  . polyethylene glycol  17 g Oral QHS  . pregabalin  25 mg Oral Daily  . sertraline  100 mg Oral Daily   Continuous Infusions:       Time spent: 25 minutes. Greater than 50% of this time was spent in direct contact with the patient coordinating care.     Lelon Frohlich, MD [facility] Pager [phone]  If 7PM-7AM, please contact night-coverage [email or web address] Password [facility] 06/17/2015, 7:17 PM

## 2015-06-18 ENCOUNTER — Observation Stay (HOSPITAL_COMMUNITY): Payer: Medicare Other

## 2015-06-18 ENCOUNTER — Encounter (HOSPITAL_COMMUNITY): Payer: Self-pay | Admitting: *Deleted

## 2015-06-18 DIAGNOSIS — G459 Transient cerebral ischemic attack, unspecified: Secondary | ICD-10-CM | POA: Diagnosis not present

## 2015-06-18 DIAGNOSIS — R4182 Altered mental status, unspecified: Secondary | ICD-10-CM | POA: Diagnosis not present

## 2015-06-18 LAB — GLUCOSE, CAPILLARY: Glucose-Capillary: 131 mg/dL — ABNORMAL HIGH (ref 65–99)

## 2015-06-18 LAB — HEMOGLOBIN A1C
Hgb A1c MFr Bld: 5.3 % (ref 4.8–5.6)
Mean Plasma Glucose: 105 mg/dL

## 2015-06-18 MED ORDER — ROSUVASTATIN CALCIUM 5 MG PO TABS: 5.0000 mg | ORAL_TABLET | Freq: Every day | ORAL | Status: DC

## 2015-06-18 MED ORDER — PREGABALIN 25 MG PO CAPS: 25.0000 mg | ORAL_CAPSULE | Freq: Every day | ORAL | Status: DC

## 2015-06-18 MED ORDER — CLOPIDOGREL BISULFATE 75 MG PO TABS: 75.0000 mg | ORAL_TABLET | Freq: Every day | ORAL | Status: DC

## 2015-06-18 MED ORDER — ROSUVASTATIN CALCIUM 10 MG PO TABS: 5.0000 mg | ORAL_TABLET | Freq: Every day | ORAL | Status: DC

## 2015-06-18 MED ORDER — CLOPIDOGREL BISULFATE 75 MG PO TABS: 75.0000 mg | ORAL_TABLET | Freq: Every day | ORAL | Status: AC

## 2015-06-18 NOTE — Discharge Summary (Signed)
Physician Discharge Summary  [name] [name] [id number] DOB: 1914-07-01 DOA: 06/16/2015  PCP: Wende Neighbors, MD  Admit date: 06/16/2015 Discharge date: 06/18/2015  Time spent: 45 minutes  Recommendations for Outpatient Follow-up:  -We'll be discharged home today. -Advised to follow-up with primary care provider in 2 weeks.   Discharge Diagnoses:  Principal Problem:   TIA (transient ischemic attack) Active Problems:   Aortic valve disorder   Herpes zoster   CKD stage 4 secondary to hypertension (HCC)   Essential hypertension   Blindness due to macular degeneration   Brain TIA   Discharge Condition: Stable and improved  Filed Weights   06/16/15 1900  Weight: 45.2 kg (99 lb 10.4 oz)    History of present illness:  As per Dr. Marin Comment on 5/20: [name] [name] is an left handed 82 y.o. female with hx prior TIA, COPD, moderate AS, HTN, hx of carotid stenosis, lives at home with her daughter and family, developed shingle about 3 weeks ago, took Acyclovir with reaction (delirum and confusion), developed post herpetic neuralgia, given one pill of Neurontin at 100mg  last night, and brought to the ER with 30 minutes of aphasia and left sided weakness for about 30 minutes. Her symptoms resolved in the ER and TPA was not given. Code stroke cancelled. Work up included a head CT showing no acute process, EKG showed NSR, and serology showed normal WBC with Cr of 1.7. Hospitalist was asked to admit her for TIA work up.   Hospital Course:   TIA -Workup including MRI, echo, Dopplers at this time have been essentially negative for acute event. -Aspirin has been upgraded to Plavix for secondary stroke prevention. -She has also been initiated on a statin given her LDL of 116.  Hypertension -Fair control, continue current management.  Chronic kidney disease stage IV -At baseline.  Procedures: 2-D echo: Left ventricle: Wall thickness was increased in a pattern of  moderate LVH. Systolic  function was normal. The estimated  ejection fraction was in the range of 55% to 60%. Doppler  parameters are consistent with abnormal left ventricular   relaxation (grade 1 diastolic dysfunction). Carotid Dopplers: IMPRESSION: 1. Calcified bilateral carotid plaque, with no hemodynamically significant stenosis (0 - 49%).  2. Normal antegrade flow both vertebral arteries.  Consultations:  None  Discharge Instructions  Discharge Instructions    Diet - low sodium heart healthy    Complete by:  As directed      Increase activity slowly    Complete by:  As directed             Medication List    STOP taking these medications        acyclovir 800 MG tablet  Commonly known as:  ZOVIRAX     aspirin 81 MG tablet     cefUROXime 250 MG tablet  Commonly known as:  CEFTIN     gabapentin 100 MG capsule  Commonly known as:  NEURONTIN      TAKE these medications        acetaminophen 500 MG tablet  Commonly known as:  TYLENOL  Take 500 mg by mouth at bedtime as needed for mild pain or moderate pain.     amLODipine 10 MG tablet  Commonly known as:  NORVASC  TAKE ONE TABLET BY MOUTH ONCE DAILY.     clopidogrel 75 MG tablet  Commonly known as:  PLAVIX  Take 1 tablet (75 mg total) by mouth daily.     darbepoetin  alfa-polysorbate 100 MCG/ML injection  Commonly known as:  ARANESP (ALBUMIN FREE)  Inject 1 mL (100 mcg total) into the skin once.     docusate sodium 100 MG capsule  Commonly known as:  COLACE  Take 100 mg by mouth 2 (two) times daily.     hydrochlorothiazide 25 MG tablet  Commonly known as:  HYDRODIURIL  Take 12.5 mg by mouth daily.     latanoprost 0.005 % ophthalmic solution  Commonly known as:  XALATAN  Place 1 drop into both eyes at bedtime.     levothyroxine 75 MCG tablet  Commonly known as:  SYNTHROID, LEVOTHROID  Take 1 tablet by mouth daily.     metoprolol succinate 50 MG 24 hr tablet  Commonly known as:  TOPROL-XL  Take 1 tablet (50 mg  total) by mouth daily. Take with or immediately following a meal.     multivitamin with minerals tablet  Take 1 tablet by mouth daily.     nitroGLYCERIN 0.4 MG SL tablet  Commonly known as:  NITROSTAT  Place 1 tablet (0.4 mg total) under the tongue every 5 (five) minutes as needed for chest pain.     pantoprazole 40 MG tablet  Commonly known as:  PROTONIX  Take 40 mg by mouth daily.     polyethylene glycol packet  Commonly known as:  MIRALAX / GLYCOLAX  Take 17 g by mouth at bedtime.     pregabalin 25 MG capsule  Commonly known as:  LYRICA  Take 1 capsule (25 mg total) by mouth at bedtime.     rosuvastatin 5 MG tablet  Commonly known as:  CRESTOR  Take 1 tablet (5 mg total) by mouth daily at 6 PM.     sertraline 100 MG tablet  Commonly known as:  ZOLOFT  Take 100 mg by mouth daily.       Allergies  Allergen Reactions  . Neosporin [Neomycin-Bacitracin Zn-Polymyx] Rash  . Nitrofurantoin Monohyd Macro Nausea And Vomiting and Other (See Comments)    Fever, chills  . Valtrex [Valacyclovir Hcl] Other (See Comments)    Confusion, leg weakness, hallucinations.  . Penicillins Rash    Has patient had a PCN reaction causing immediate rash, facial/tongue/throat swelling, SOB or lightheadedness with hypotension: Yes Has patient had a PCN reaction causing severe rash involving mucus membranes or skin necrosis: No Has patient had a PCN reaction that required hospitalization No Has patient had a PCN reaction occurring within the last 10 years: No If all of the above answers are "NO", then may proceed with Cephalosporin use.        Follow-up Information    Follow up with Wende Neighbors, MD. Schedule an appointment as soon as possible for a visit in 2 weeks.   Specialty:  Internal Medicine   Contact information:   [place] [address] [phone]        The results of significant diagnostics from this hospitalization (including imaging, microbiology, ancillary  and laboratory) are listed below for reference.    Significant Diagnostic Studies: Ct Head Wo Contrast  06/16/2015  CLINICAL DATA:  Acute mental status change. EXAM: CT HEAD WITHOUT CONTRAST TECHNIQUE: Contiguous axial images were obtained from the base of the skull through the vertex without intravenous contrast. COMPARISON:  Jun 06, 2015 FINDINGS: There is decreased fluid in the sphenoid sinus compared to the previous study. Paranasal sinuses and mastoid air cells are otherwise within normal limits. Visualized bones are normal. Extracranial soft tissues are  normal. No subdural, epidural, or subarachnoid hemorrhage. The cerebellum, brainstem, and basal cisterns are stable with no acute abnormalities. Ventricles and sulci are prominent but unchanged. Scattered Choyce matter changes are identified, also unchanged. No acute cortical ischemia or infarct identified on today's study. No mass, mass effect, or midline shift. IMPRESSION: 1. No acute abnormalities. Findings called to Dr. Alvino Chapel at 11:45 a.m. Electronically Signed   By: Dorise Bullion III M.D   On: 06/16/2015 11:51   Ct Head Wo Contrast  06/06/2015  CLINICAL DATA:  82 year old female with altered mental status and hallucinations since yesterday. Initial encounter. EXAM: CT HEAD WITHOUT CONTRAST TECHNIQUE: Contiguous axial images were obtained from the base of the skull through the vertex without intravenous contrast. COMPARISON:  05/04/2015 and earlier. FINDINGS: Chronic sphenoid sinusitis. Increased left sphenoid sinus fluid level since April. Mild maxillary sinus mucosal thickening appears stable. Other Visualized paranasal sinuses and mastoids are clear. Osteopenia No acute osseous abnormality identified. No acute orbit or scalp soft tissue finding. Calcified atherosclerosis at the skull base. Stable cerebral volume. Small chronic lacunar infarct in the superior left cerebellum is unchanged. Elsewhere gray-Pettway matter differentiation is normal  for age. No midline shift, ventriculomegaly, mass effect, evidence of mass lesion, intracranial hemorrhage or evidence of cortically based acute infarction. IMPRESSION: No acute intracranial abnormality. Stable mild for age chronic small vessel disease. Electronically Signed   By: Genevie Ann M.D.   On: 06/06/2015 15:17   Mr Brain Wo Contrast  06/18/2015  CLINICAL DATA:  Altered mental status and weakness for 4 days. EXAM: MRI HEAD WITHOUT CONTRAST TECHNIQUE: Multiplanar, multiecho pulse sequences of the brain and surrounding structures were obtained without intravenous contrast. COMPARISON:  Head CT 2 days ago FINDINGS: Motion degraded study, best obtainable, which could obscure pathology. Calvarium and upper cervical spine: No focal marrow signal abnormality. Cervical facet arthropathy. Orbits: Bilateral cataract resection. Sinuses and Mastoids: Fluid again noted layering within the left sphenoid sinus, chronic based on head CTs dating back to 2014. Brain: No acute infarct, hemorrhage, hydrocephalus, or swelling. No evidence of large vessel occlusion. Generalized atrophy with ventriculomegaly. Chronic small-vessel disease with ischemic gliosis in the deep and periventricular Luscher matter. Remote small vessel infarcts bilaterally in the cerebellum. IMPRESSION: 1. Motion degraded study without acute finding. 2. Atrophy and moderate chronic small vessel disease. Electronically Signed   By: Monte Fantasia M.D.   On: 06/18/2015 08:12   US Carotid Bilateral  06/17/2015  CLINICAL DATA:  TIA. EXAM: BILATERAL CAROTID DUPLEX ULTRASOUND TECHNIQUE: Pearline Cables scale imaging, color Doppler and duplex ultrasound were performed of bilateral carotid and vertebral arteries in the neck. COMPARISON:  04/01/2011 carotid Doppler scan. FINDINGS: Criteria: Quantification of carotid stenosis is based on velocity parameters that correlate the residual internal carotid diameter with NASCET-based stenosis levels, using the diameter of the  distal internal carotid lumen as the denominator for stenosis measurement. The following velocity measurements were obtained: RIGHT ICA:  58 cm/sec CCA:  50 cm/sec SYSTOLIC ICA/CCA RATIO:  1.2 DIASTOLIC ICA/CCA RATIO:  0.7 ECA:  89 cm/sec LEFT ICA:  38 cm/sec CCA:  38 cm/sec SYSTOLIC ICA/CCA RATIO:  1.0 DIASTOLIC ICA/CCA RATIO:  1.0 ECA:  33 cm/sec RIGHT CAROTID ARTERY: Mild calcified plaque at the right carotid bulb and bifurcation, with less than 20% proximal right internal carotid artery stenosis by grayscale imaging. No hemodynamically significant right internal carotid artery stenosis by Doppler criteria. RIGHT VERTEBRAL ARTERY:  Normal antegrade flow. LEFT CAROTID ARTERY: There is at least moderate calcified plaque in  the visualized proximal left common carotid artery. There is mild calcified plaque at the left carotid bulb and bifurcation with approximately 20% proximal left internal carotid artery stenosis by grayscale imaging. No hemodynamically significant left internal carotid artery stenosis by grayscale imaging. LEFT VERTEBRAL ARTERY:  Normal antegrade flow. IMPRESSION: 1. Calcified bilateral carotid plaque, with no hemodynamically significant stenosis (0 - 49%). 2. Normal antegrade flow both vertebral arteries. Electronically Signed   By: Ilona Sorrel M.D.   On: 06/17/2015 10:05   Dg Chest Portable 1 View  06/16/2015  CLINICAL DATA:  Left-sided weakness. EXAM: PORTABLE CHEST 1 VIEW COMPARISON:  May 14, 2015 FINDINGS: Cardiomegaly. Tortuous thoracic aorta. The increased prominence of the torturous thoracic aorta could be technical given kyphotic positioning and portable technique. No pneumothorax. A vertically oriented density over the right upper chest may be something on the patient, not seen 1 month ago. No pulmonary nodules or masses. No acute infiltrates. No other acute abnormalities. IMPRESSION: Increased tortuosity of the thoracic aorta is probably technical in nature. A vertically oriented  density over the right upper chest is probably something on the patient, not seen 1 month ago. A PA and lateral chest x-ray could better evaluate if concern persists. Electronically Signed   By: Dorise Bullion III M.D   On: 06/16/2015 12:33    Microbiology: No results found for this or any previous visit (from the past 240 hour(s)).   Labs: Basic Metabolic Panel:  Recent Labs Lab 06/16/15 1143 06/16/15 1159  NA 136 139  K 3.5 3.6  CL 98* 97*  CO2 29  --   GLUCOSE 138* 132*  BUN 26* 24*  CREATININE 1.71* 1.70*  CALCIUM 9.7  --    Liver Function Tests:  Recent Labs Lab 06/16/15 1143  AST 25  ALT 11*  ALKPHOS 62  BILITOT 0.3  PROT 6.6  ALBUMIN 3.4*   No results for input(s): LIPASE, AMYLASE in the last 168 hours. No results for input(s): AMMONIA in the last 168 hours. CBC:  Recent Labs Lab 06/16/15 1143 06/16/15 1159  WBC 5.9  --   NEUTROABS 4.3  --   HGB 10.8* 11.9*  HCT 33.2* 35.0*  MCV 94.9  --   PLT 284  --    Cardiac Enzymes: No results for input(s): CKTOTAL, CKMB, CKMBINDEX, TROPONINI in the last 168 hours. BNP: BNP (last 3 results) No results for input(s): BNP in the last 8760 hours.  ProBNP (last 3 results) No results for input(s): PROBNP in the last 8760 hours.  CBG:  Recent Labs Lab 06/16/15 1110  GLUCAP 131*       Signed:  HERNANDEZ ACOSTA,Vishwa Dais  [facility] Pager: [phone] 06/18/2015, 3:48 PM

## 2015-06-18 NOTE — Care Management Note (Signed)
Case Management Note  Patient Details  Name: [name] [name] MRN: [id number] Date of Birth: [birth year]  Subjective/Objective:                  Pt is from home, lives with her son and DIL. Pt is ind with ADL's at baseline. Pt has cane, walker, WC and BSC if needed. Pt is active with [facility] for PT services and DIL wishes to resume those services at DC. Pt discharging today. Pt's DIL understands that [facility] has 48 hours to resume services. DIL not interested in hospital bed at this time. Romualdo Bolk, of [facility], made aware of DC today and plan and will obtain pt info from chart.   Action/Plan: Anticipate DC today.   Expected Discharge Date:    06/18/2015              Expected Discharge Plan:  [place]  In-House Referral:  NA  Discharge planning Services  CM Consult  Post Acute Care Choice:  Home Health, Resumption of Svcs/PTA Provider Choice offered to:  Patient  DME Arranged:    DME Agency:     HH Arranged:  RN, PT [place] Agency:  [place]  Status of Service:  Completed, signed off  Medicare Important Message Given:    Date Medicare IM Given:    Medicare IM give by:    Date Additional Medicare IM Given:    Additional Medicare Important Message give by:     If discussed at [place] of Stay Meetings, dates discussed:    Additional Comments:  Sherald Barge, RN 06/18/2015, 3:03 PM

## 2015-06-18 NOTE — Evaluation (Signed)
Physical Therapy Evaluation Patient Details Name: [name] [name] MRN: [id number] DOB: 1915-01-24 Today's Date: 06/18/2015   History of Present Illness  82 yo F admitted due to confusion with recent herpes zoster infection with L chest and breast rash, placed on Valtrex and began hallucinating.  Dx: Valtrex neurotoxicity, and possible UTI.  PMH: bachache, s/p kyphoplasty, dizziness/giddiness, thyrotoxicosis, anemia, essential HTN, COPD, SBO.    Clinical Impression  Pt received in bed, Dtr present, and pt was agreeable to PT evaluation.  Pt normally lived with her dtr in a 2nd floor bedroom.  Dtr provides 24/7 supervision/assistance.  Pt requires assist for ambulation with RW, transfers, dressing, and bathing.  Today, pt required Min A for bed mobility, Min/Mod A for transfer sit<>stand, and Min A for ambulation x 93ft with posterior lean during transfers and gait.  Pt will need continued 24/7 supervision/assistance, as well as HHPT upon d/c home.  Pt wishes to stay in her 2nd floor bedroom, and dtr has a plan to use the stretcher sheet from the ambulance, and have her 2 son-in-laws assist with carrying her up the steps.      Follow Up Recommendations Home health PT (they have used [facility] in the past.)    Equipment Recommendations  None recommended by PT    Recommendations for Other Services       Precautions / Restrictions Precautions Precautions: Fall;Other (comment) Precaution Comments: Had a fall around Easter, where she was sitting on the toilet, and passed out and fell - no major injuries.       Mobility  Bed Mobility Overal bed mobility: Needs Assistance Bed Mobility: Supine to Sit     Supine to sit: Min assist        Transfers Overall transfer level: Needs assistance Equipment used: Rolling walker (2 wheeled) Transfers: Sit to/from Stand Sit to Stand: Min assist;Mod assist         General transfer comment: Pt pulled up on the RW, therefore RW braced.  Pt  demonstrates posterior lean upon standing.   Ambulation/Gait Ambulation/Gait assistance: Min assist Ambulation Distance (Feet): 20 Feet Assistive device: Rolling walker (2 wheeled) Gait Pattern/deviations: Step-to pattern;Trunk flexed;Decreased stride length     General Gait Details: Pt demonstrates significantly shorter stride length on the left, as well as posterior lean.  Gait distance limited due to fatigue.   Stairs            Wheelchair Mobility    Modified Rankin (Stroke Patients Only)       Balance Overall balance assessment: Needs assistance Sitting-balance support: Bilateral upper extremity supported Sitting balance-Leahy Scale: Fair     Standing balance support: Bilateral upper extremity supported Standing balance-Leahy Scale: Fair                               Pertinent Vitals/Pain Pain Assessment: Faces Faces Pain Scale: Hurts little more Pain Location: L breast area - where the shingles were located.  Pain Intervention(s): Limited activity within patient's tolerance    Home Living Family/patient expects to be discharged to:: Private residence Living Arrangements: Children (dtr) Available Help at Discharge: Family;Home health Type of Home: House       Home Layout: Two level Home Equipment: Environmental consultant - 2 wheels;Walker - 4 wheels;Shower seat;Bedside commode Additional Comments: Caregiver 3days/wk    Prior Function Level of Independence: Needs assistance   Gait / Transfers Assistance Needed: Dtr states that since last admission, pt was receiving  24/7 supervision/assistance, and there is someone there all the time when pt is ambulating, and negotiating steps.  Pt also has assistance for all transfers including bed level, and sit<>stand transfers.   ADL's / Homemaking Assistance Needed: Dtr takes care of household responsibilities, Pt requires assistance for both dressing and bathing.         Hand Dominance        Extremity/Trunk  Assessment   Upper Extremity Assessment: Generalized weakness           Lower Extremity Assessment: Generalized weakness         Communication   Communication: HOH  Cognition Arousal/Alertness: Awake/alert Behavior During Therapy: WFL for tasks assessed/performed Overall Cognitive Status: Within Functional Limits for tasks assessed                      General Comments      Exercises        Assessment/Plan    PT Assessment Patient needs continued PT services  PT Diagnosis Difficulty walking;Abnormality of gait;Generalized weakness   PT Problem List Decreased strength;Decreased activity tolerance;Decreased balance;Decreased mobility;Decreased safety awareness;Decreased knowledge of precautions;Cardiopulmonary status limiting activity;Decreased skin integrity;Pain;Decreased knowledge of use of DME  PT Treatment Interventions Gait training;DME instruction;Functional mobility training;Therapeutic activities;Therapeutic exercise;Balance training;Patient/family education   PT Goals (Current goals can be found in the Care Plan section) Acute Rehab PT Goals Patient Stated Goal: To go home PT Goal Formulation: With patient/family Time For Goal Achievement: 06/25/15 Potential to Achieve Goals: Good    Frequency Min 3X/week   Barriers to discharge Inaccessible home environment Pt wants to stay in her bedroom, which is up 1 flight of steps.  Dtr states that they plan to use a lift sheet that they got from the ambulance when she was brought in, and have her 2 son in laws assist with carrying the pt up the steps to her bedroom.  Pt also informed pt's dtr of option for an ambulance to take her home and up the steps.     Co-evaluation               End of Session Equipment Utilized During Treatment: Gait belt Activity Tolerance: Patient tolerated treatment well Patient left: in chair;with call bell/phone within reach;with family/visitor present Nurse Communication:  Mobility status    Functional Assessment Tool Used: [employer] "6-clicks."  Functional Limitation: Mobility: Walking and moving around Mobility: Walking and Moving Around Current Status [phone]): At least 40 percent but less than 60 percent impaired, limited or restricted Mobility: Walking and Moving Around Goal Status [phone]): At least 20 percent but less than 40 percent impaired, limited or restricted    Time: [id number] PT Time Calculation (min) (ACUTE ONLY): 22 min   Charges:   PT Evaluation $PT Eval Low Complexity: 1 Procedure     PT G Codes:   PT G-Codes **NOT FOR INPATIENT CLASS** Functional Assessment Tool Used: [employer] "6-clicks."  Functional Limitation: Mobility: Walking and moving around Mobility: Walking and Moving Around Current Status [phone]): At least 40 percent but less than 60 percent impaired, limited or restricted Mobility: Walking and Moving Around Goal Status [phone]): At least 20 percent but less than 40 percent impaired, limited or restricted    Tacy Learn, PT, DPT X: [id number]   06/18/2015, 3:19 PM

## 2015-06-26 ENCOUNTER — Emergency Department (HOSPITAL_COMMUNITY): Payer: Medicare Other

## 2015-06-26 ENCOUNTER — Encounter (HOSPITAL_COMMUNITY): Payer: Self-pay

## 2015-06-26 ENCOUNTER — Emergency Department (HOSPITAL_COMMUNITY)
Admission: EM | Admit: 2015-06-26 | Discharge: 2015-06-26 | Disposition: A | Discharge status: Home / Self Care | Payer: Medicare Other | Attending: Emergency Medicine | Admitting: Emergency Medicine

## 2015-06-26 DIAGNOSIS — J449 Chronic obstructive pulmonary disease, unspecified: Secondary | ICD-10-CM | POA: Diagnosis not present

## 2015-06-26 DIAGNOSIS — Z862 Personal history of diseases of the blood and blood-forming organs and certain disorders involving the immune mechanism: Secondary | ICD-10-CM | POA: Insufficient documentation

## 2015-06-26 DIAGNOSIS — I10 Essential (primary) hypertension: Secondary | ICD-10-CM | POA: Diagnosis not present

## 2015-06-26 DIAGNOSIS — R402421 Glasgow coma scale score 9-12, in the field [EMT or ambulance]: Secondary | ICD-10-CM | POA: Diagnosis not present

## 2015-06-26 DIAGNOSIS — Z880 Allergy status to penicillin: Secondary | ICD-10-CM | POA: Diagnosis not present

## 2015-06-26 DIAGNOSIS — T50905A Adverse effect of unspecified drugs, medicaments and biological substances, initial encounter: Secondary | ICD-10-CM | POA: Insufficient documentation

## 2015-06-26 DIAGNOSIS — E0590 Thyrotoxicosis, unspecified without thyrotoxic crisis or storm: Secondary | ICD-10-CM | POA: Insufficient documentation

## 2015-06-26 DIAGNOSIS — R55 Syncope and collapse: Secondary | ICD-10-CM | POA: Insufficient documentation

## 2015-06-26 DIAGNOSIS — Z8673 Personal history of transient ischemic attack (TIA), and cerebral infarction without residual deficits: Secondary | ICD-10-CM | POA: Insufficient documentation

## 2015-06-26 DIAGNOSIS — T887XXA Unspecified adverse effect of drug or medicament, initial encounter: Secondary | ICD-10-CM | POA: Diagnosis not present

## 2015-06-26 DIAGNOSIS — Z8739 Personal history of other diseases of the musculoskeletal system and connective tissue: Secondary | ICD-10-CM | POA: Insufficient documentation

## 2015-06-26 DIAGNOSIS — Z79899 Other long term (current) drug therapy: Secondary | ICD-10-CM | POA: Diagnosis not present

## 2015-06-26 DIAGNOSIS — R404 Transient alteration of awareness: Secondary | ICD-10-CM | POA: Diagnosis not present

## 2015-06-26 LAB — RAPID URINE DRUG SCREEN, HOSP PERFORMED
Amphetamines: NOT DETECTED
BARBITURATES: NOT DETECTED
Benzodiazepines: NOT DETECTED
COCAINE: NOT DETECTED
Opiates: NOT DETECTED
Tetrahydrocannabinol: NOT DETECTED

## 2015-06-26 LAB — URINE MICROSCOPIC-ADD ON: BACTERIA UA: NONE SEEN

## 2015-06-26 LAB — CBC WITH DIFFERENTIAL/PLATELET
Basophils Absolute: 0 10*3/uL (ref 0.0–0.1)
Basophils Relative: 1 %
EOS PCT: 6 %
Eosinophils Absolute: 0.3 10*3/uL (ref 0.0–0.7)
HCT: 32.6 % — ABNORMAL LOW (ref 36.0–46.0)
Hemoglobin: 10.3 g/dL — ABNORMAL LOW (ref 12.0–15.0)
LYMPHS ABS: 1.1 10*3/uL (ref 0.7–4.0)
LYMPHS PCT: 25 %
MCH: 29.7 pg (ref 26.0–34.0)
MCHC: 31.6 g/dL (ref 30.0–36.0)
MCV: 93.9 fL (ref 78.0–100.0)
MONO ABS: 0.5 10*3/uL (ref 0.1–1.0)
Monocytes Relative: 10 %
Neutro Abs: 2.7 10*3/uL (ref 1.7–7.7)
Neutrophils Relative %: 58 %
PLATELETS: 274 10*3/uL (ref 150–400)
RBC: 3.47 MIL/uL — AB (ref 3.87–5.11)
RDW: 14.1 % (ref 11.5–15.5)
WBC: 4.6 10*3/uL (ref 4.0–10.5)

## 2015-06-26 LAB — URINALYSIS, ROUTINE W REFLEX MICROSCOPIC
BILIRUBIN URINE: NEGATIVE
Glucose, UA: NEGATIVE mg/dL
Hgb urine dipstick: NEGATIVE
Ketones, ur: NEGATIVE mg/dL
Nitrite: NEGATIVE
PROTEIN: NEGATIVE mg/dL
SPECIFIC GRAVITY, URINE: 1.018 (ref 1.005–1.030)
pH: 7 (ref 5.0–8.0)

## 2015-06-26 LAB — COMPREHENSIVE METABOLIC PANEL
ALT: 10 U/L — AB (ref 14–54)
AST: 23 U/L (ref 15–41)
Albumin: 3.3 g/dL — ABNORMAL LOW (ref 3.5–5.0)
Alkaline Phosphatase: 59 U/L (ref 38–126)
Anion gap: 6 (ref 5–15)
BUN: 24 mg/dL — ABNORMAL HIGH (ref 6–20)
CHLORIDE: 100 mmol/L — AB (ref 101–111)
CO2: 32 mmol/L (ref 22–32)
CREATININE: 1.86 mg/dL — AB (ref 0.44–1.00)
Calcium: 10.2 mg/dL (ref 8.9–10.3)
GFR, EST AFRICAN AMERICAN: 25 mL/min — AB (ref >60)
GFR, EST NON AFRICAN AMERICAN: 21 mL/min — AB (ref >60)
Glucose, Bld: 95 mg/dL (ref 65–99)
Potassium: 4.5 mmol/L (ref 3.5–5.1)
Sodium: 138 mmol/L (ref 135–145)
TOTAL PROTEIN: 6.4 g/dL — AB (ref 6.5–8.1)
Total Bilirubin: 0.4 mg/dL (ref 0.3–1.2)

## 2015-06-26 LAB — I-STAT CHEM 8, ED
BUN: 26 mg/dL — ABNORMAL HIGH (ref 6–20)
CALCIUM ION: 1.26 mmol/L (ref 1.13–1.30)
CREATININE: 1.9 mg/dL — AB (ref 0.44–1.00)
Chloride: 98 mmol/L — ABNORMAL LOW (ref 101–111)
GLUCOSE: 93 mg/dL (ref 65–99)
HEMATOCRIT: 30 % — AB (ref 36.0–46.0)
HEMOGLOBIN: 10.2 g/dL — AB (ref 12.0–15.0)
Potassium: 4.4 mmol/L (ref 3.5–5.1)
Sodium: 139 mmol/L (ref 135–145)
TCO2: 30 mmol/L (ref 0–100)

## 2015-06-26 LAB — CBG MONITORING, ED: GLUCOSE-CAPILLARY: 110 mg/dL — AB (ref 65–99)

## 2015-06-26 LAB — PROTIME-INR
INR: 0.98 (ref 0.00–1.49)
PROTHROMBIN TIME: 13.2 s (ref 11.6–15.2)

## 2015-06-26 LAB — I-STAT TROPONIN, ED: TROPONIN I, POC: 0.02 ng/mL (ref 0.00–0.08)

## 2015-06-26 LAB — ETHANOL: Alcohol, Ethyl (B): <5 mg/dL (ref <5)

## 2015-06-26 LAB — APTT: APTT: 28 s (ref 24–37)

## 2015-06-26 NOTE — ED Provider Notes (Signed)
CSN: [id number]     Arrival date & time 06/26/15  1527 History   First MD Initiated Contact with Patient 06/26/15 1533     Chief Complaint  Patient presents with  . Loss of Consciousness    (Consider location/radiation/quality/duration/timing/severity/associated sxs/prior Treatment) Patient is a 82 y.o. female presenting with syncope.  Loss of Consciousness   [name] [name] is a 82 y.o. female  with a PMH of TIA, COPD, aortic stenosis who presents to the Emergency Department with her daughter for a possible syncopal episode. Per daughter at bedside, she awoke at 10 am this morning at her baseline mental status, lucid and ambulating at baseline. At 11:00 this morning, she was using her walker to ambulate to the restroom when her "legs fell out from under her" - daughter helped her to a sitting position and states she did not hit her head. Since this episode, daughter states she has not been at her baseline mental status - she has been more somnolent and having difficulty with her speech. Daughter states that she appears to have difficulty getting her words out, but still answering questions appropriately. Daughter denies extremity weakness, fever, difficulty breathing. Patient does have known zoster flare, but reports no other pain.   Of note, patient was recently admitted on 5/20 for somewhat similar episode where she experienced a change in mental status, could not speak, and would not move her left side. She had a TIA workup while admitted which was reassuring. MRI, echo with EF of 55-60%, and doppler all essentially negative for an acute event.   Past Medical History  Diagnosis Date  . Backache, unspecified   . Dizziness and giddiness   . Thyrotoxicosis without mention of goiter or other cause, without mention of thyrotoxic crisis or storm   . Unspecified disorder resulting from impaired renal function   . Anemia, unspecified   . Unspecified essential hypertension   . COPD (chronic  obstructive pulmonary disease) ([place])   . Osteoporosis   . TIA (transient ischemic attack)   . Stroke ([place])     Tia  . Aortic stenosis    Past Surgical History  Procedure Laterality Date  . Small bowel obst    . Salivary gland surgery Left   . Back surgery      Kyphoplasty   No family history on file. Social History  Substance Use Topics  . Smoking status: Never Smoker   . Smokeless tobacco: Never Used  . Alcohol Use: No   OB History    No data available     Review of Systems  Unable to perform ROS: Mental status change  Cardiovascular: Positive for syncope.     Allergies  Neosporin; Nitrofurantoin monohyd macro; Valtrex; and Penicillins  Home Medications   Prior to Admission medications   Medication Sig Start Date End Date Taking? Authorizing Provider  acetaminophen (TYLENOL) 500 MG tablet Take 500 mg by mouth at bedtime as needed for mild pain or moderate pain.     Historical Provider, MD  amLODipine (NORVASC) 10 MG tablet TAKE ONE TABLET BY MOUTH ONCE DAILY. 04/30/15   Arnoldo Lenis, MD  clopidogrel (PLAVIX) 75 MG tablet Take 1 tablet (75 mg total) by mouth daily. 06/18/15   Erline Hau, MD  darbepoetin alfa-polysorbate (ARANESP, ALBUMIN FREE,) 100 MCG/ML injection Inject 1 mL (100 mcg total) into the skin once. 08/14/10   Fran Lowes, MD  docusate sodium (COLACE) 100 MG capsule Take 100 mg by mouth 2 (two) times  daily.      Historical Provider, MD  hydrochlorothiazide (HYDRODIURIL) 25 MG tablet Take 12.5 mg by mouth daily. 05/07/15   Historical Provider, MD  latanoprost (XALATAN) 0.005 % ophthalmic solution Place 1 drop into both eyes at bedtime.  07/28/11   Historical Provider, MD  levothyroxine (SYNTHROID, LEVOTHROID) 75 MCG tablet Take 1 tablet by mouth daily. 04/28/15   Historical Provider, MD  metoprolol succinate (TOPROL-XL) 50 MG 24 hr tablet Take 1 tablet (50 mg total) by mouth daily. Take with or immediately following a meal. Patient taking  differently: Take 100 mg by mouth every evening. Take with or immediately following a meal. 06/07/14   Arnoldo Lenis, MD  Multiple Vitamins-Minerals (MULTIVITAMIN WITH MINERALS) tablet Take 1 tablet by mouth daily.      Historical Provider, MD  nitroGLYCERIN (NITROSTAT) 0.4 MG SL tablet Place 1 tablet (0.4 mg total) under the tongue every 5 (five) minutes as needed for chest pain. 06/07/14   Arnoldo Lenis, MD  pantoprazole (PROTONIX) 40 MG tablet Take 40 mg by mouth daily.    Historical Provider, MD  polyethylene glycol (MIRALAX / GLYCOLAX) packet Take 17 g by mouth at bedtime.     Historical Provider, MD  pregabalin (LYRICA) 25 MG capsule Take 1 capsule (25 mg total) by mouth at bedtime. 06/18/15   Erline Hau, MD  rosuvastatin (CRESTOR) 5 MG tablet Take 1 tablet (5 mg total) by mouth daily at 6 PM. 06/18/15   Erline Hau, MD  sertraline (ZOLOFT) 100 MG tablet Take 100 mg by mouth daily.    Historical Provider, MD   BP 168/73 mmHg  Pulse 73  Temp(Src) 97.8 F (36.6 C) (Oral)  Resp 18  Ht 4\' 11"  (1.499 m)  Wt 44.906 kg  BMI 19.98 kg/m2  SpO2 99% Physical Exam  Constitutional: She appears well-developed.  HENT:  Head: Normocephalic and atraumatic.  Eyes:  Pupils equal.  Neck: Neck supple.  No meningeal signs.  Cardiovascular: Normal rate and regular rhythm.   Pulmonary/Chest: Effort normal and breath sounds normal. No respiratory distress.  Abdominal: Soft. She exhibits no distension. There is no tenderness.  Musculoskeletal: She exhibits no edema.  Neurological: She is alert.  Alert to person and place. No facial droop. Sensation grossly intact in all four extremities. Good muscle strength including grip strength in all four extremities.     ED Course  Procedures (including critical care time) Labs Review Labs Reviewed  CBC WITH DIFFERENTIAL/PLATELET - Abnormal; Notable for the following:    RBC 3.47 (*)    Hemoglobin 10.3 (*)    HCT 32.6 (*)     All other components within normal limits  COMPREHENSIVE METABOLIC PANEL - Abnormal; Notable for the following:    Chloride 100 (*)    BUN 24 (*)    Creatinine, Ser 1.86 (*)    Total Protein 6.4 (*)    Albumin 3.3 (*)    ALT 10 (*)    GFR calc non Af Amer 21 (*)    GFR calc Af Amer 25 (*)    All other components within normal limits  URINALYSIS, ROUTINE W REFLEX MICROSCOPIC (NOT AT [facility]) - Abnormal; Notable for the following:    APPearance CLOUDY (*)    Leukocytes, UA SMALL (*)    All other components within normal limits  URINE MICROSCOPIC-ADD ON - Abnormal; Notable for the following:    Squamous Epithelial / LPF 0-5 (*)    Casts HYALINE CASTS (*)  All other components within normal limits  I-STAT CHEM 8, ED - Abnormal; Notable for the following:    Chloride 98 (*)    BUN 26 (*)    Creatinine, Ser 1.90 (*)    Hemoglobin 10.2 (*)    HCT 30.0 (*)    All other components within normal limits  URINE CULTURE  APTT  PROTIME-INR  ETHANOL  URINE RAPID DRUG SCREEN, HOSP PERFORMED  I-STAT TROPOININ, ED  POCT CBG (FASTING - GLUCOSE)-MANUAL ENTRY    Imaging Review Ct Head Wo Contrast  06/26/2015  CLINICAL DATA:  Syncopal episode today.  Code stroke. EXAM: CT HEAD WITHOUT CONTRAST TECHNIQUE: Contiguous axial images were obtained from the base of the skull through the vertex without intravenous contrast. COMPARISON:  Brain MRI 06/18/2015 FINDINGS: Skull and Sinuses:Negative for fracture or destructive process. Chronic sphenoid sinusitis with right more than left opacification and sclerotic wall thickening. Visualized orbits: Bilateral cataract resection. Brain: No evidence of acute infarction, hemorrhage, hydrocephalus, or mass lesion/mass effect. There is generalized atrophy. Chronic small-vessel disease with moderate ischemic gliosis in the cerebral Wojcicki matter. Remote small vessel infarcts in the inferior right and superior left cerebellum. These results were called by telephone at  the time of interpretation on 06/26/2015 at 4:42 pm to Dr. Silverio Decamp , who verbally acknowledged these results. IMPRESSION: 1. No acute finding or change from prior. 2. Atrophy and chronic small vessel disease. Electronically Signed   By: Monte Fantasia M.D.   On: 06/26/2015 16:45   Dg Chest Port 1 View  06/26/2015  CLINICAL DATA:  82 year old who had a syncopal episode at 11 o'clock a.m. this morning. Patient still confused. EXAM: PORTABLE CHEST 1 VIEW COMPARISON:  06/16/2015 and earlier, including CT chest 04/26/2012. FINDINGS: Cardiac silhouette moderately to markedly enlarged, unchanged. Thoracic aorta tortuous, atherosclerotic and ectatic, unchanged. Hilar and mediastinal contours otherwise unremarkable. Low lung volumes. Lungs clear. Bronchovascular markings normal. Pulmonary vascularity normal. No visible pleural effusions. IMPRESSION: Stable cardiomegaly.  No acute cardiopulmonary disease. Electronically Signed   By: Evangeline Dakin M.D.   On: 06/26/2015 16:14   I have personally reviewed and evaluated these images and lab results as part of my medical decision-making.   EKG Interpretation None      MDM   Final diagnoses:  Syncope  Medication reaction, initial encounter   DNAJA SLEPPY presents with daughter for change in mental status this morning at 11:00. Patient became weak and her legs went out from under her, no head injury. Since that time, she has been more somnolent than baseline and having difficulty with her speech. Patient was seen by Dr. Roderic Palau and code stroke protocol initiated.   Labs: See above. All labs reviewed and reassuring. Urine sent for culture.  Imaging: CXR with no acute findings, CT head with no acute finding or change from previous.  Consults: Neurology who believes symptoms are 2/2 starting lyrica which she began last night.   A&P: Patient re-evaluated and she feels much improved. Daughter at bedside state speech has improved a great deal since they have  been in ED. Daughter feels mental status is back to baseline and feels "very comfortable" with discharge to home. Daughter states patient lives with her, sitter during day and she is home at night. Home health, RN, and PT already come to home.   Evaluation does not show pathology that would require ongoing emergent intervention or inpatient treatment. Patient is hemodynamically stable and mentating appropriately. Discussed findings and plan with patient and daughter who  agree with treatment plan as dictated. PCP follow up strongly encourage. Return precautions discussed and all questions answered.   Patient seen by and discussed with Dr. Roderic Palau who agrees with treatment plan.   [facility], PA-C 06/26/15 1823  Milton Ferguson, MD 06/26/15 [phone]

## 2015-06-26 NOTE — Discharge Instructions (Signed)
Follow up with your primary care physician within one week for discussion of your diagnoses and further evaluation after today's visit; Please return to the ER for slurred speech, muscle weakness, new or worsening symptoms, any additional concerns.

## 2015-06-26 NOTE — ED Notes (Signed)
Pt arrives EMS with c/o syncope at 1100 without "returning to normal " per family. Pt had similar event on the 20 and seen at [facility] with DX of TIA.

## 2015-06-26 NOTE — ED Notes (Signed)
Pt and family state they understand instructions.

## 2015-06-28 LAB — URINE CULTURE

## 2015-07-18 ENCOUNTER — Ambulatory Visit: Payer: Medicare Other | Admitting: Cardiology

## 2015-07-18 ENCOUNTER — Other Ambulatory Visit (HOSPITAL_COMMUNITY)
Admission: RE | Admit: 2015-07-18 | Discharge: 2015-07-18 | Disposition: A | Discharge status: Home / Self Care | Payer: Medicare Other | Source: Other Acute Inpatient Hospital | Attending: Internal Medicine | Admitting: Internal Medicine

## 2015-07-18 DIAGNOSIS — Z029 Encounter for administrative examinations, unspecified: Secondary | ICD-10-CM | POA: Insufficient documentation

## 2015-07-18 LAB — CBC
HCT: 27.5 % — ABNORMAL LOW (ref 36.0–46.0)
HEMOGLOBIN: 9 g/dL — AB (ref 12.0–15.0)
MCH: 31.1 pg (ref 26.0–34.0)
MCHC: 32.7 g/dL (ref 30.0–36.0)
MCV: 95.2 fL (ref 78.0–100.0)
Platelets: 265 10*3/uL (ref 150–400)
RBC: 2.89 MIL/uL — AB (ref 3.87–5.11)
RDW: 13.6 % (ref 11.5–15.5)
WBC: 6.3 10*3/uL (ref 4.0–10.5)

## 2015-07-26 ENCOUNTER — Encounter (HOSPITAL_COMMUNITY)
Admission: RE | Admit: 2015-07-26 | Discharge: 2015-07-26 | Disposition: A | Discharge status: Home / Self Care | Payer: Medicare Other | Source: Ambulatory Visit | Attending: Nephrology | Admitting: Nephrology

## 2015-07-26 DIAGNOSIS — N183 Chronic kidney disease, stage 3 (moderate): Secondary | ICD-10-CM | POA: Diagnosis not present

## 2015-07-26 DIAGNOSIS — D631 Anemia in chronic kidney disease: Secondary | ICD-10-CM | POA: Insufficient documentation

## 2015-07-26 LAB — RENAL FUNCTION PANEL
ALBUMIN: 3.4 g/dL — AB (ref 3.5–5.0)
Anion gap: 9 (ref 5–15)
BUN: 32 mg/dL — AB (ref 6–20)
CO2: 30 mmol/L (ref 22–32)
CREATININE: 1.86 mg/dL — AB (ref 0.44–1.00)
Calcium: 9.7 mg/dL (ref 8.9–10.3)
Chloride: 102 mmol/L (ref 101–111)
GFR calc Af Amer: 25 mL/min — ABNORMAL LOW (ref >60)
GFR calc non Af Amer: 21 mL/min — ABNORMAL LOW (ref >60)
GLUCOSE: 115 mg/dL — AB (ref 65–99)
PHOSPHORUS: 3.5 mg/dL (ref 2.5–4.6)
POTASSIUM: 3.5 mmol/L (ref 3.5–5.1)
Sodium: 141 mmol/L (ref 135–145)

## 2015-07-26 MED ORDER — DARBEPOETIN ALFA 100 MCG/0.5ML IJ SOSY
100.0000 ug | PREFILLED_SYRINGE | Freq: Once | INTRAMUSCULAR | Status: AC
  Administered 2015-07-26: 100 ug via SUBCUTANEOUS

## 2015-07-26 MED ORDER — DARBEPOETIN ALFA 100 MCG/0.5ML IJ SOSY
PREFILLED_SYRINGE | INTRAMUSCULAR | Status: AC
  Filled 2015-07-26: qty 0.5

## 2015-08-08 DIAGNOSIS — B029 Zoster without complications: Secondary | ICD-10-CM | POA: Diagnosis not present

## 2015-08-08 DIAGNOSIS — D500 Iron deficiency anemia secondary to blood loss (chronic): Secondary | ICD-10-CM | POA: Diagnosis not present

## 2015-08-08 DIAGNOSIS — R443 Hallucinations, unspecified: Secondary | ICD-10-CM | POA: Diagnosis not present

## 2015-08-08 DIAGNOSIS — H540 Blindness, both eyes: Secondary | ICD-10-CM | POA: Diagnosis not present

## 2015-08-08 DIAGNOSIS — N184 Chronic kidney disease, stage 4 (severe): Secondary | ICD-10-CM | POA: Diagnosis not present

## 2015-08-08 DIAGNOSIS — T375X5D Adverse effect of antiviral drugs, subsequent encounter: Secondary | ICD-10-CM | POA: Diagnosis not present

## 2015-08-08 DIAGNOSIS — I129 Hypertensive chronic kidney disease with stage 1 through stage 4 chronic kidney disease, or unspecified chronic kidney disease: Secondary | ICD-10-CM | POA: Diagnosis not present

## 2015-08-08 DIAGNOSIS — H3530 Unspecified macular degeneration: Secondary | ICD-10-CM | POA: Diagnosis not present

## 2015-08-08 DIAGNOSIS — Z87440 Personal history of urinary (tract) infections: Secondary | ICD-10-CM | POA: Diagnosis not present

## 2015-08-10 DIAGNOSIS — N184 Chronic kidney disease, stage 4 (severe): Secondary | ICD-10-CM | POA: Diagnosis not present

## 2015-08-10 DIAGNOSIS — I129 Hypertensive chronic kidney disease with stage 1 through stage 4 chronic kidney disease, or unspecified chronic kidney disease: Secondary | ICD-10-CM | POA: Diagnosis not present

## 2015-08-10 DIAGNOSIS — T375X5D Adverse effect of antiviral drugs, subsequent encounter: Secondary | ICD-10-CM | POA: Diagnosis not present

## 2015-08-10 DIAGNOSIS — B029 Zoster without complications: Secondary | ICD-10-CM | POA: Diagnosis not present

## 2015-08-10 DIAGNOSIS — R443 Hallucinations, unspecified: Secondary | ICD-10-CM | POA: Diagnosis not present

## 2015-08-10 DIAGNOSIS — D500 Iron deficiency anemia secondary to blood loss (chronic): Secondary | ICD-10-CM | POA: Diagnosis not present

## 2015-08-14 DIAGNOSIS — D500 Iron deficiency anemia secondary to blood loss (chronic): Secondary | ICD-10-CM | POA: Diagnosis not present

## 2015-08-14 DIAGNOSIS — I129 Hypertensive chronic kidney disease with stage 1 through stage 4 chronic kidney disease, or unspecified chronic kidney disease: Secondary | ICD-10-CM | POA: Diagnosis not present

## 2015-08-14 DIAGNOSIS — R443 Hallucinations, unspecified: Secondary | ICD-10-CM | POA: Diagnosis not present

## 2015-08-14 DIAGNOSIS — B029 Zoster without complications: Secondary | ICD-10-CM | POA: Diagnosis not present

## 2015-08-14 DIAGNOSIS — T375X5D Adverse effect of antiviral drugs, subsequent encounter: Secondary | ICD-10-CM | POA: Diagnosis not present

## 2015-08-14 DIAGNOSIS — N184 Chronic kidney disease, stage 4 (severe): Secondary | ICD-10-CM | POA: Diagnosis not present

## 2015-08-16 DIAGNOSIS — I129 Hypertensive chronic kidney disease with stage 1 through stage 4 chronic kidney disease, or unspecified chronic kidney disease: Secondary | ICD-10-CM | POA: Diagnosis not present

## 2015-08-16 DIAGNOSIS — B029 Zoster without complications: Secondary | ICD-10-CM | POA: Diagnosis not present

## 2015-08-16 DIAGNOSIS — T375X5D Adverse effect of antiviral drugs, subsequent encounter: Secondary | ICD-10-CM | POA: Diagnosis not present

## 2015-08-16 DIAGNOSIS — R443 Hallucinations, unspecified: Secondary | ICD-10-CM | POA: Diagnosis not present

## 2015-08-16 DIAGNOSIS — D500 Iron deficiency anemia secondary to blood loss (chronic): Secondary | ICD-10-CM | POA: Diagnosis not present

## 2015-08-16 DIAGNOSIS — N184 Chronic kidney disease, stage 4 (severe): Secondary | ICD-10-CM | POA: Diagnosis not present

## 2015-08-20 DIAGNOSIS — R443 Hallucinations, unspecified: Secondary | ICD-10-CM | POA: Diagnosis not present

## 2015-08-20 DIAGNOSIS — I129 Hypertensive chronic kidney disease with stage 1 through stage 4 chronic kidney disease, or unspecified chronic kidney disease: Secondary | ICD-10-CM | POA: Diagnosis not present

## 2015-08-20 DIAGNOSIS — B029 Zoster without complications: Secondary | ICD-10-CM | POA: Diagnosis not present

## 2015-08-20 DIAGNOSIS — T375X5D Adverse effect of antiviral drugs, subsequent encounter: Secondary | ICD-10-CM | POA: Diagnosis not present

## 2015-08-20 DIAGNOSIS — D500 Iron deficiency anemia secondary to blood loss (chronic): Secondary | ICD-10-CM | POA: Diagnosis not present

## 2015-08-20 DIAGNOSIS — N184 Chronic kidney disease, stage 4 (severe): Secondary | ICD-10-CM | POA: Diagnosis not present

## 2015-08-21 DIAGNOSIS — E559 Vitamin D deficiency, unspecified: Secondary | ICD-10-CM | POA: Diagnosis not present

## 2015-08-21 DIAGNOSIS — R0602 Shortness of breath: Secondary | ICD-10-CM | POA: Diagnosis not present

## 2015-08-21 DIAGNOSIS — Z9181 History of falling: Secondary | ICD-10-CM | POA: Diagnosis not present

## 2015-08-21 DIAGNOSIS — R7301 Impaired fasting glucose: Secondary | ICD-10-CM | POA: Diagnosis not present

## 2015-08-21 DIAGNOSIS — H3530 Unspecified macular degeneration: Secondary | ICD-10-CM | POA: Diagnosis not present

## 2015-08-21 DIAGNOSIS — I129 Hypertensive chronic kidney disease with stage 1 through stage 4 chronic kidney disease, or unspecified chronic kidney disease: Secondary | ICD-10-CM | POA: Diagnosis not present

## 2015-08-21 DIAGNOSIS — R1310 Dysphagia, unspecified: Secondary | ICD-10-CM | POA: Diagnosis not present

## 2015-08-21 DIAGNOSIS — R0600 Dyspnea, unspecified: Secondary | ICD-10-CM | POA: Diagnosis not present

## 2015-08-21 DIAGNOSIS — B0222 Postherpetic trigeminal neuralgia: Secondary | ICD-10-CM | POA: Diagnosis not present

## 2015-08-21 DIAGNOSIS — N184 Chronic kidney disease, stage 4 (severe): Secondary | ICD-10-CM | POA: Diagnosis not present

## 2015-08-21 DIAGNOSIS — B029 Zoster without complications: Secondary | ICD-10-CM | POA: Diagnosis not present

## 2015-08-21 DIAGNOSIS — E039 Hypothyroidism, unspecified: Secondary | ICD-10-CM | POA: Diagnosis not present

## 2015-08-21 DIAGNOSIS — T375X5D Adverse effect of antiviral drugs, subsequent encounter: Secondary | ICD-10-CM | POA: Diagnosis not present

## 2015-08-21 DIAGNOSIS — D500 Iron deficiency anemia secondary to blood loss (chronic): Secondary | ICD-10-CM | POA: Diagnosis not present

## 2015-08-21 DIAGNOSIS — D649 Anemia, unspecified: Secondary | ICD-10-CM | POA: Diagnosis not present

## 2015-08-21 DIAGNOSIS — R443 Hallucinations, unspecified: Secondary | ICD-10-CM | POA: Diagnosis not present

## 2015-08-27 DIAGNOSIS — D500 Iron deficiency anemia secondary to blood loss (chronic): Secondary | ICD-10-CM | POA: Diagnosis not present

## 2015-08-27 DIAGNOSIS — I129 Hypertensive chronic kidney disease with stage 1 through stage 4 chronic kidney disease, or unspecified chronic kidney disease: Secondary | ICD-10-CM | POA: Diagnosis not present

## 2015-08-27 DIAGNOSIS — B029 Zoster without complications: Secondary | ICD-10-CM | POA: Diagnosis not present

## 2015-08-27 DIAGNOSIS — N184 Chronic kidney disease, stage 4 (severe): Secondary | ICD-10-CM | POA: Diagnosis not present

## 2015-08-27 DIAGNOSIS — R443 Hallucinations, unspecified: Secondary | ICD-10-CM | POA: Diagnosis not present

## 2015-08-27 DIAGNOSIS — T375X5D Adverse effect of antiviral drugs, subsequent encounter: Secondary | ICD-10-CM | POA: Diagnosis not present

## 2015-08-29 DIAGNOSIS — D500 Iron deficiency anemia secondary to blood loss (chronic): Secondary | ICD-10-CM | POA: Diagnosis not present

## 2015-08-29 DIAGNOSIS — R443 Hallucinations, unspecified: Secondary | ICD-10-CM | POA: Diagnosis not present

## 2015-08-29 DIAGNOSIS — T375X5D Adverse effect of antiviral drugs, subsequent encounter: Secondary | ICD-10-CM | POA: Diagnosis not present

## 2015-08-29 DIAGNOSIS — N184 Chronic kidney disease, stage 4 (severe): Secondary | ICD-10-CM | POA: Diagnosis not present

## 2015-08-29 DIAGNOSIS — B029 Zoster without complications: Secondary | ICD-10-CM | POA: Diagnosis not present

## 2015-08-29 DIAGNOSIS — I129 Hypertensive chronic kidney disease with stage 1 through stage 4 chronic kidney disease, or unspecified chronic kidney disease: Secondary | ICD-10-CM | POA: Diagnosis not present

## 2015-09-06 DIAGNOSIS — T375X5D Adverse effect of antiviral drugs, subsequent encounter: Secondary | ICD-10-CM | POA: Diagnosis not present

## 2015-09-06 DIAGNOSIS — B029 Zoster without complications: Secondary | ICD-10-CM | POA: Diagnosis not present

## 2015-09-06 DIAGNOSIS — I129 Hypertensive chronic kidney disease with stage 1 through stage 4 chronic kidney disease, or unspecified chronic kidney disease: Secondary | ICD-10-CM | POA: Diagnosis not present

## 2015-09-06 DIAGNOSIS — D500 Iron deficiency anemia secondary to blood loss (chronic): Secondary | ICD-10-CM | POA: Diagnosis not present

## 2015-09-06 DIAGNOSIS — N184 Chronic kidney disease, stage 4 (severe): Secondary | ICD-10-CM | POA: Diagnosis not present

## 2015-09-06 DIAGNOSIS — R443 Hallucinations, unspecified: Secondary | ICD-10-CM | POA: Diagnosis not present

## 2015-09-07 DIAGNOSIS — N184 Chronic kidney disease, stage 4 (severe): Secondary | ICD-10-CM | POA: Diagnosis not present

## 2015-09-07 DIAGNOSIS — R443 Hallucinations, unspecified: Secondary | ICD-10-CM | POA: Diagnosis not present

## 2015-09-07 DIAGNOSIS — B029 Zoster without complications: Secondary | ICD-10-CM | POA: Diagnosis not present

## 2015-09-07 DIAGNOSIS — D500 Iron deficiency anemia secondary to blood loss (chronic): Secondary | ICD-10-CM | POA: Diagnosis not present

## 2015-09-07 DIAGNOSIS — I129 Hypertensive chronic kidney disease with stage 1 through stage 4 chronic kidney disease, or unspecified chronic kidney disease: Secondary | ICD-10-CM | POA: Diagnosis not present

## 2015-09-07 DIAGNOSIS — T375X5D Adverse effect of antiviral drugs, subsequent encounter: Secondary | ICD-10-CM | POA: Diagnosis not present

## 2015-09-08 DIAGNOSIS — N390 Urinary tract infection, site not specified: Secondary | ICD-10-CM | POA: Diagnosis not present

## 2015-09-10 DIAGNOSIS — D500 Iron deficiency anemia secondary to blood loss (chronic): Secondary | ICD-10-CM | POA: Diagnosis not present

## 2015-09-10 DIAGNOSIS — N184 Chronic kidney disease, stage 4 (severe): Secondary | ICD-10-CM | POA: Diagnosis not present

## 2015-09-10 DIAGNOSIS — B029 Zoster without complications: Secondary | ICD-10-CM | POA: Diagnosis not present

## 2015-09-10 DIAGNOSIS — T375X5D Adverse effect of antiviral drugs, subsequent encounter: Secondary | ICD-10-CM | POA: Diagnosis not present

## 2015-09-10 DIAGNOSIS — R443 Hallucinations, unspecified: Secondary | ICD-10-CM | POA: Diagnosis not present

## 2015-09-10 DIAGNOSIS — I129 Hypertensive chronic kidney disease with stage 1 through stage 4 chronic kidney disease, or unspecified chronic kidney disease: Secondary | ICD-10-CM | POA: Diagnosis not present

## 2015-09-11 ENCOUNTER — Encounter: Payer: Self-pay | Admitting: Cardiology

## 2015-09-12 DIAGNOSIS — D500 Iron deficiency anemia secondary to blood loss (chronic): Secondary | ICD-10-CM | POA: Diagnosis not present

## 2015-09-12 DIAGNOSIS — T375X5D Adverse effect of antiviral drugs, subsequent encounter: Secondary | ICD-10-CM | POA: Diagnosis not present

## 2015-09-12 DIAGNOSIS — B029 Zoster without complications: Secondary | ICD-10-CM | POA: Diagnosis not present

## 2015-09-12 DIAGNOSIS — R443 Hallucinations, unspecified: Secondary | ICD-10-CM | POA: Diagnosis not present

## 2015-09-12 DIAGNOSIS — I129 Hypertensive chronic kidney disease with stage 1 through stage 4 chronic kidney disease, or unspecified chronic kidney disease: Secondary | ICD-10-CM | POA: Diagnosis not present

## 2015-09-12 DIAGNOSIS — N184 Chronic kidney disease, stage 4 (severe): Secondary | ICD-10-CM | POA: Diagnosis not present

## 2015-09-24 DIAGNOSIS — E039 Hypothyroidism, unspecified: Secondary | ICD-10-CM | POA: Diagnosis not present

## 2015-09-24 DIAGNOSIS — D509 Iron deficiency anemia, unspecified: Secondary | ICD-10-CM | POA: Diagnosis not present

## 2015-09-24 DIAGNOSIS — E782 Mixed hyperlipidemia: Secondary | ICD-10-CM | POA: Diagnosis not present

## 2015-09-28 ENCOUNTER — Other Ambulatory Visit: Payer: Self-pay

## 2015-10-04 ENCOUNTER — Encounter (HOSPITAL_COMMUNITY): Payer: Medicare Other

## 2015-10-04 ENCOUNTER — Encounter (HOSPITAL_COMMUNITY): Admission: RE | Admit: 2015-10-04 | Payer: Medicare Other | Source: Ambulatory Visit

## 2015-10-08 ENCOUNTER — Encounter (HOSPITAL_COMMUNITY)
Admission: RE | Admit: 2015-10-08 | Discharge: 2015-10-08 | Disposition: A | Discharge status: Home / Self Care | Payer: Medicare Other | Source: Ambulatory Visit | Attending: Nephrology | Admitting: Nephrology

## 2015-10-08 DIAGNOSIS — N183 Chronic kidney disease, stage 3 (moderate): Secondary | ICD-10-CM | POA: Insufficient documentation

## 2015-10-08 DIAGNOSIS — D631 Anemia in chronic kidney disease: Secondary | ICD-10-CM | POA: Diagnosis not present

## 2015-10-08 LAB — RENAL FUNCTION PANEL
Albumin: 3.7 g/dL (ref 3.5–5.0)
Anion gap: 8 (ref 5–15)
BUN: 23 mg/dL — ABNORMAL HIGH (ref 6–20)
CALCIUM: 9.6 mg/dL (ref 8.9–10.3)
CO2: 29 mmol/L (ref 22–32)
CREATININE: 1.86 mg/dL — AB (ref 0.44–1.00)
Chloride: 102 mmol/L (ref 101–111)
GFR, EST AFRICAN AMERICAN: 25 mL/min — AB (ref >60)
GFR, EST NON AFRICAN AMERICAN: 21 mL/min — AB (ref >60)
Glucose, Bld: 94 mg/dL (ref 65–99)
PHOSPHORUS: 3.3 mg/dL (ref 2.5–4.6)
Potassium: 3.2 mmol/L — ABNORMAL LOW (ref 3.5–5.1)
SODIUM: 139 mmol/L (ref 135–145)

## 2015-10-08 LAB — HEMOGLOBIN AND HEMATOCRIT, BLOOD
HEMATOCRIT: 27.8 % — AB (ref 36.0–46.0)
Hemoglobin: 9 g/dL — ABNORMAL LOW (ref 12.0–15.0)

## 2015-10-08 MED ORDER — DARBEPOETIN ALFA 100 MCG/0.5ML IJ SOSY
100.0000 ug | PREFILLED_SYRINGE | INTRAMUSCULAR | Status: DC
  Administered 2015-10-08: 100 ug via SUBCUTANEOUS

## 2015-10-08 MED ORDER — DARBEPOETIN ALFA 100 MCG/0.5ML IJ SOSY
PREFILLED_SYRINGE | INTRAMUSCULAR | Status: AC
  Filled 2015-10-08: qty 0.5

## 2015-10-08 NOTE — Progress Notes (Signed)
Results for RATASHA, FABRE (MRN [id number]) as of 10/08/2015 16:19  Ref. Range 10/08/2015 12:58  Sodium Latest Ref Range: 135 - 145 mmol/L 139  Potassium Latest Ref Range: 3.5 - 5.1 mmol/L 3.2 (L)  Chloride Latest Ref Range: 101 - 111 mmol/L 102  CO2 Latest Ref Range: 22 - 32 mmol/L 29  BUN Latest Ref Range: 6 - 20 mg/dL 23 (H)  Creatinine Latest Ref Range: 0.44 - 1.00 mg/dL 1.86 (H)  Calcium Latest Ref Range: 8.9 - 10.3 mg/dL 9.6  EGFR (Non-African Amer.) Latest Ref Range: >60 mL/min 21 (L)  EGFR (African American) Latest Ref Range: >60 mL/min 25 (L)  Glucose Latest Ref Range: 65 - 99 mg/dL 94  Anion gap Latest Ref Range: 5 - 15  8  Phosphorus Latest Ref Range: 2.5 - 4.6 mg/dL 3.3  Albumin Latest Ref Range: 3.5 - 5.0 g/dL 3.7  Hemoglobin Latest Ref Range: 12.0 - 15.0 g/dL 9.0 (L)  HCT Latest Ref Range: 36.0 - 46.0 % 27.8 (L)

## 2015-10-12 ENCOUNTER — Encounter: Payer: Self-pay | Admitting: Cardiology

## 2015-10-12 ENCOUNTER — Ambulatory Visit (INDEPENDENT_AMBULATORY_CARE_PROVIDER_SITE_OTHER): Payer: Medicare Other | Admitting: Cardiology

## 2015-10-12 ENCOUNTER — Ambulatory Visit: Payer: Medicare Other | Admitting: Cardiology

## 2015-10-12 VITALS — BP 144/64 | HR 67 | Ht 60.0 in | Wt 100.0 lb

## 2015-10-12 DIAGNOSIS — I10 Essential (primary) hypertension: Secondary | ICD-10-CM

## 2015-10-12 DIAGNOSIS — I350 Nonrheumatic aortic (valve) stenosis: Secondary | ICD-10-CM | POA: Diagnosis not present

## 2015-10-12 NOTE — Progress Notes (Signed)
Clinical Summary [name] [name] is a 82 y.o.female seen today for follow up of the following medical problems.   1. Aortic stenosis  - echo 03/2013 AVA 1.18 Mean grad 24 - 05/2015 echo LVEF 55-60%, mean grad 16, AVA 1.17.   - no recent chest pain or syncope. Has had some issues with DOE over the last several weeks   2. Anemia - she in on aranesp, managed by nephrology  3. HTN  - does not check regularlyat home - she is compliant with meds   4. TIA - admission 05/2015 with TIA - followed by pcp   Past Medical History:  Diagnosis Date  . Anemia, unspecified   . Aortic stenosis   . Backache, unspecified   . COPD (chronic obstructive pulmonary disease) ([place])   . Dizziness and giddiness   . Osteoporosis   . Stroke ([place])    Tia  . Thyrotoxicosis without mention of goiter or other cause, without mention of thyrotoxic crisis or storm   . TIA (transient ischemic attack)   . Unspecified disorder resulting from impaired renal function   . Unspecified essential hypertension      Allergies  Allergen Reactions  . Neosporin [Neomycin-Bacitracin Zn-Polymyx] Rash  . Nitrofurantoin Monohyd Macro Nausea And Vomiting and Other (See Comments)    Fever, chills  . Valtrex [Valacyclovir Hcl] Other (See Comments)    Confusion, leg weakness, hallucinations.  . Penicillins Rash    Has patient had a PCN reaction causing immediate rash, facial/tongue/throat swelling, SOB or lightheadedness with hypotension: Yes Has patient had a PCN reaction causing severe rash involving mucus membranes or skin necrosis: No Has patient had a PCN reaction that required hospitalization No Has patient had a PCN reaction occurring within the last 10 years: No If all of the above answers are "NO", then may proceed with Cephalosporin use.      Current Outpatient Prescriptions  Medication Sig Dispense Refill  . acetaminophen (TYLENOL) 500 MG tablet Take 500 mg by mouth at bedtime as needed for mild pain  or moderate pain.     Marland Kitchen amLODipine (NORVASC) 10 MG tablet TAKE ONE TABLET BY MOUTH ONCE DAILY. 90 tablet 2  . clopidogrel (PLAVIX) 75 MG tablet Take 1 tablet (75 mg total) by mouth daily. 30 tablet 2  . darbepoetin alfa-polysorbate (ARANESP, ALBUMIN FREE,) 100 MCG/ML injection Inject 1 mL (100 mcg total) into the skin once. 0.4 mL 0  . docusate sodium (COLACE) 100 MG capsule Take 100 mg by mouth 2 (two) times daily.      . hydrochlorothiazide (HYDRODIURIL) 25 MG tablet Take 12.5 mg by mouth daily.    Marland Kitchen latanoprost (XALATAN) 0.005 % ophthalmic solution Place 1 drop into both eyes at bedtime.     Marland Kitchen levothyroxine (SYNTHROID, LEVOTHROID) 75 MCG tablet Take 1 tablet by mouth daily.    . metoprolol succinate (TOPROL-XL) 50 MG 24 hr tablet Take 1 tablet (50 mg total) by mouth daily. Take with or immediately following a meal. (Patient taking differently: Take 100 mg by mouth every evening. Take with or immediately following a meal.) 90 tablet 3  . Multiple Vitamins-Minerals (MULTIVITAMIN WITH MINERALS) tablet Take 1 tablet by mouth daily.      . nitroGLYCERIN (NITROSTAT) 0.4 MG SL tablet Place 1 tablet (0.4 mg total) under the tongue every 5 (five) minutes as needed for chest pain. 25 tablet 3  . pantoprazole (PROTONIX) 40 MG tablet Take 40 mg by mouth daily.    . polyethylene  glycol (MIRALAX / GLYCOLAX) packet Take 17 g by mouth at bedtime.     . pregabalin (LYRICA) 25 MG capsule Take 1 capsule (25 mg total) by mouth at bedtime. 30 capsule 2  . rosuvastatin (CRESTOR) 5 MG tablet Take 1 tablet (5 mg total) by mouth daily at 6 PM. 30 tablet 2  . sertraline (ZOLOFT) 100 MG tablet Take 100 mg by mouth daily.     No current facility-administered medications for this visit.    Facility-Administered Medications Ordered in Other Visits  Medication Dose Route Frequency Provider Last Rate Last Dose  . darbepoetin (ARANESP) injection 100 mcg  100 mcg Subcutaneous Once Fran Lowes, MD         Past  Surgical History:  Procedure Laterality Date  . BACK SURGERY     Kyphoplasty  . SALIVARY GLAND SURGERY Left   . small bowel obst       Allergies  Allergen Reactions  . Neosporin [Neomycin-Bacitracin Zn-Polymyx] Rash  . Nitrofurantoin Monohyd Macro Nausea And Vomiting and Other (See Comments)    Fever, chills  . Valtrex [Valacyclovir Hcl] Other (See Comments)    Confusion, leg weakness, hallucinations.  . Penicillins Rash    Has patient had a PCN reaction causing immediate rash, facial/tongue/throat swelling, SOB or lightheadedness with hypotension: Yes Has patient had a PCN reaction causing severe rash involving mucus membranes or skin necrosis: No Has patient had a PCN reaction that required hospitalization No Has patient had a PCN reaction occurring within the last 10 years: No If all of the above answers are "NO", then may proceed with Cephalosporin use.       No family history on file.   Social History [name] [name] reports that she has never smoked. She has never used smokeless tobacco. [name] [name] reports that she does not drink alcohol.   Review of Systems CONSTITUTIONAL: No weight loss, fever, chills, weakness or fatigue.  HEENT: Eyes: No visual loss, blurred vision, double vision or yellow sclerae.No hearing loss, sneezing, congestion, runny nose or sore throat.  SKIN: No rash or itching.  CARDIOVASCULAR: per HPI RESPIRATORY: No shortness of breath, cough or sputum.  GASTROINTESTINAL: No anorexia, nausea, vomiting or diarrhea. No abdominal pain or blood.  GENITOURINARY: No burning on urination, no polyuria NEUROLOGICAL: No headache, dizziness, syncope, paralysis, ataxia, numbness or tingling in the extremities. No change in bowel or bladder control.  MUSCULOSKELETAL: No muscle, back pain, joint pain or stiffness.  LYMPHATICS: No enlarged nodes. No history of splenectomy.  PSYCHIATRIC: No history of depression or anxiety.  ENDOCRINOLOGIC: No reports of sweating, cold  or heat intolerance. No polyuria or polydipsia.  Marland Kitchen   Physical Examination Vitals:   10/12/15 1122  BP: (!) 144/64  Pulse: 67   Vitals:   10/12/15 1122  Weight: 100 lb (45.4 kg)  Height: 5' (1.524 m)    Gen: resting comfortably, no acute distress HEENT: no scleral icterus, pupils equal round and reactive, no palptable cervical adenopathy,  CV: RRR, 3/6 systolic murmur RUSB, no jvd Resp: Clear to auscultation bilaterally GI: abdomen is soft, non-tender, non-distended, normal bowel sounds, no hepatosplenomegaly MSK: extremities are warm, no edema.  Skin: warm, no rash Neuro:  no focal deficits Psych: appropriate affect   Diagnostic Studies 03/2011 Echo: LVEF 55-60%, mild LVH, no WMAs, grade I diastolic dysfunction, moderate AS (mead grad 13, AVA 1.15 by VTI, dimensionless index 0.45).   03/2011 Renal Artery Duplex: difficult study, normal renal arteries   03/2011 Carotid US:  1.  Bilateral carotid bifurcation and proximal ICA plaque, resulting in less than 50% diameter stenosis. Little convincing progression since previous exam. The exam does not exclude plaque ulceration or embolization. Continued surveillance recommended.  03/2013 echo Study Conclusions  - Left ventricle: The cavity size was normal. Wall thickness was increased in a pattern of mild LVH. Systolic function was normal. The estimated ejection fraction was in the range of 50% to 55%. Although no diagnostic regional wall motion abnormality was identified, this possibility cannot be completely excluded on the basis of this study. Doppler parameters are consistent with abnormal left ventricular relaxation (grade 1 diastolic dysfunction). - Aortic valve: Moderately calcified annulus. Possibly functionally bicuspid; moderately calcified leaflets. Cusp separation was severely reduced. There was moderate to severe stenosis. Mild regurgitation. Mean gradient: 9mm Hg (S). Peak gradient:  75mm Hg (S). LVOT/AV VTI ratio 0.36. Valve area: 1.18cm^2(VTI). - Mitral valve: Calcified annulus. Mildly thickened leaflets . Trivial regurgitation. - Left atrium: The atrium was mildly dilated. - Right atrium: Central venous pressure: 60mm Hg (est). - Atrial septum: No defect or patent foramen ovale was identified. - Tricuspid valve: Trivial regurgitation. - Pulmonary arteries: PA peak pressure: 36mm Hg (S). - Pericardium, extracardiac: A prominent pericardial fat pad was present. Impressions:  - Mild LVH with LVEF [id number], grade 1 diastolic dysfunction. Mild left atrial enlargement. MAC with trivial mitral regurgitation. Moderate to severe aortic stenosis as outlined above. Gradients have increased somewhat compared to prior study. Mild aortic regurgitation. PASP 39 mmHg.    Assessment and Plan   1. Aortic stenosis  - moderate by most recent echo,we will continue to follow   2. HTN  - at goal. High doses of bp meds caused dizziness.  She will continue current meds  3.TIA - per pcp, may need outpatient neuro f/u as well, defer to pcp   F/u 1 year  Arnoldo Lenis, M.D.

## 2015-10-12 NOTE — Patient Instructions (Addendum)
Your physician wants you to follow-up in: 1 year with DrBranch You will receive a reminder letter in the mail two months in advance. If you don't receive a letter, please call our office to schedule the follow-up appointment.     Your physician recommends that you continue on your current medications as directed. Please refer to the Current Medication list given to you today.      Thank you for choosing [facility] !        

## 2015-11-16 DIAGNOSIS — N390 Urinary tract infection, site not specified: Secondary | ICD-10-CM | POA: Diagnosis not present

## 2015-12-03 DIAGNOSIS — Z23 Encounter for immunization: Secondary | ICD-10-CM | POA: Diagnosis not present

## 2015-12-13 ENCOUNTER — Inpatient Hospital Stay (HOSPITAL_COMMUNITY)
Admission: EM | Admit: 2015-12-13 | Discharge: 2015-12-15 | DRG: 682 | Disposition: A | Discharge status: Home Health | Payer: Medicare Other | Attending: Internal Medicine | Admitting: Internal Medicine

## 2015-12-13 ENCOUNTER — Emergency Department (HOSPITAL_COMMUNITY): Payer: Medicare Other

## 2015-12-13 ENCOUNTER — Encounter (HOSPITAL_COMMUNITY): Payer: Self-pay | Admitting: *Deleted

## 2015-12-13 DIAGNOSIS — J9621 Acute and chronic respiratory failure with hypoxia: Secondary | ICD-10-CM | POA: Diagnosis present

## 2015-12-13 DIAGNOSIS — Z7902 Long term (current) use of antithrombotics/antiplatelets: Secondary | ICD-10-CM

## 2015-12-13 DIAGNOSIS — Z888 Allergy status to other drugs, medicaments and biological substances status: Secondary | ICD-10-CM

## 2015-12-13 DIAGNOSIS — Z9181 History of falling: Secondary | ICD-10-CM

## 2015-12-13 DIAGNOSIS — M79641 Pain in right hand: Secondary | ICD-10-CM | POA: Diagnosis not present

## 2015-12-13 DIAGNOSIS — I1310 Hypertensive heart and chronic kidney disease without heart failure, with stage 1 through stage 4 chronic kidney disease, or unspecified chronic kidney disease: Secondary | ICD-10-CM | POA: Diagnosis not present

## 2015-12-13 DIAGNOSIS — R112 Nausea with vomiting, unspecified: Secondary | ICD-10-CM

## 2015-12-13 DIAGNOSIS — R7989 Other specified abnormal findings of blood chemistry: Secondary | ICD-10-CM | POA: Diagnosis present

## 2015-12-13 DIAGNOSIS — N179 Acute kidney failure, unspecified: Principal | ICD-10-CM

## 2015-12-13 DIAGNOSIS — R197 Diarrhea, unspecified: Secondary | ICD-10-CM

## 2015-12-13 DIAGNOSIS — R51 Headache: Secondary | ICD-10-CM | POA: Diagnosis not present

## 2015-12-13 DIAGNOSIS — Z66 Do not resuscitate: Secondary | ICD-10-CM

## 2015-12-13 DIAGNOSIS — M542 Cervicalgia: Secondary | ICD-10-CM | POA: Diagnosis not present

## 2015-12-13 DIAGNOSIS — I951 Orthostatic hypotension: Secondary | ICD-10-CM

## 2015-12-13 DIAGNOSIS — E038 Other specified hypothyroidism: Secondary | ICD-10-CM

## 2015-12-13 DIAGNOSIS — S6991XA Unspecified injury of right wrist, hand and finger(s), initial encounter: Secondary | ICD-10-CM | POA: Diagnosis not present

## 2015-12-13 DIAGNOSIS — E860 Dehydration: Secondary | ICD-10-CM | POA: Diagnosis not present

## 2015-12-13 DIAGNOSIS — J449 Chronic obstructive pulmonary disease, unspecified: Secondary | ICD-10-CM | POA: Diagnosis present

## 2015-12-13 DIAGNOSIS — S59901A Unspecified injury of right elbow, initial encounter: Secondary | ICD-10-CM | POA: Diagnosis not present

## 2015-12-13 DIAGNOSIS — E869 Volume depletion, unspecified: Secondary | ICD-10-CM | POA: Diagnosis not present

## 2015-12-13 DIAGNOSIS — J9601 Acute respiratory failure with hypoxia: Secondary | ICD-10-CM | POA: Diagnosis present

## 2015-12-13 DIAGNOSIS — S299XXA Unspecified injury of thorax, initial encounter: Secondary | ICD-10-CM | POA: Diagnosis not present

## 2015-12-13 DIAGNOSIS — S60221A Contusion of right hand, initial encounter: Secondary | ICD-10-CM

## 2015-12-13 DIAGNOSIS — M25551 Pain in right hip: Secondary | ICD-10-CM | POA: Diagnosis not present

## 2015-12-13 DIAGNOSIS — D649 Anemia, unspecified: Secondary | ICD-10-CM

## 2015-12-13 DIAGNOSIS — N390 Urinary tract infection, site not specified: Secondary | ICD-10-CM | POA: Diagnosis present

## 2015-12-13 DIAGNOSIS — R531 Weakness: Secondary | ICD-10-CM | POA: Diagnosis not present

## 2015-12-13 DIAGNOSIS — R778 Other specified abnormalities of plasma proteins: Secondary | ICD-10-CM

## 2015-12-13 DIAGNOSIS — Y92009 Unspecified place in unspecified non-institutional (private) residence as the place of occurrence of the external cause: Secondary | ICD-10-CM

## 2015-12-13 DIAGNOSIS — M25521 Pain in right elbow: Secondary | ICD-10-CM | POA: Diagnosis not present

## 2015-12-13 DIAGNOSIS — Z880 Allergy status to penicillin: Secondary | ICD-10-CM

## 2015-12-13 DIAGNOSIS — S0990XA Unspecified injury of head, initial encounter: Secondary | ICD-10-CM | POA: Diagnosis not present

## 2015-12-13 DIAGNOSIS — R1111 Vomiting without nausea: Secondary | ICD-10-CM | POA: Diagnosis not present

## 2015-12-13 DIAGNOSIS — J9811 Atelectasis: Secondary | ICD-10-CM | POA: Diagnosis present

## 2015-12-13 DIAGNOSIS — M6281 Muscle weakness (generalized): Secondary | ICD-10-CM

## 2015-12-13 DIAGNOSIS — I10 Essential (primary) hypertension: Secondary | ICD-10-CM

## 2015-12-13 DIAGNOSIS — N184 Chronic kidney disease, stage 4 (severe): Secondary | ICD-10-CM

## 2015-12-13 DIAGNOSIS — W19XXXA Unspecified fall, initial encounter: Secondary | ICD-10-CM | POA: Diagnosis present

## 2015-12-13 DIAGNOSIS — I129 Hypertensive chronic kidney disease with stage 1 through stage 4 chronic kidney disease, or unspecified chronic kidney disease: Secondary | ICD-10-CM | POA: Diagnosis not present

## 2015-12-13 DIAGNOSIS — Z79899 Other long term (current) drug therapy: Secondary | ICD-10-CM

## 2015-12-13 DIAGNOSIS — R0602 Shortness of breath: Secondary | ICD-10-CM

## 2015-12-13 DIAGNOSIS — E876 Hypokalemia: Secondary | ICD-10-CM | POA: Diagnosis present

## 2015-12-13 DIAGNOSIS — E039 Hypothyroidism, unspecified: Secondary | ICD-10-CM | POA: Diagnosis present

## 2015-12-13 DIAGNOSIS — S199XXA Unspecified injury of neck, initial encounter: Secondary | ICD-10-CM | POA: Diagnosis not present

## 2015-12-13 DIAGNOSIS — Z8673 Personal history of transient ischemic attack (TIA), and cerebral infarction without residual deficits: Secondary | ICD-10-CM

## 2015-12-13 DIAGNOSIS — D638 Anemia in other chronic diseases classified elsewhere: Secondary | ICD-10-CM | POA: Diagnosis present

## 2015-12-13 DIAGNOSIS — S79911A Unspecified injury of right hip, initial encounter: Secondary | ICD-10-CM | POA: Diagnosis not present

## 2015-12-13 DIAGNOSIS — K2970 Gastritis, unspecified, without bleeding: Secondary | ICD-10-CM | POA: Diagnosis not present

## 2015-12-13 DIAGNOSIS — I350 Nonrheumatic aortic (valve) stenosis: Secondary | ICD-10-CM | POA: Diagnosis present

## 2015-12-13 LAB — URINALYSIS, ROUTINE W REFLEX MICROSCOPIC
Bilirubin Urine: NEGATIVE
GLUCOSE, UA: NEGATIVE mg/dL
Hgb urine dipstick: NEGATIVE
Ketones, ur: NEGATIVE mg/dL
Nitrite: NEGATIVE
PROTEIN: 30 mg/dL — AB
Specific Gravity, Urine: 1.025 (ref 1.005–1.030)
pH: 5.5 (ref 5.0–8.0)

## 2015-12-13 LAB — LACTIC ACID, PLASMA: LACTIC ACID, VENOUS: 1 mmol/L (ref 0.5–1.9)

## 2015-12-13 LAB — URINE MICROSCOPIC-ADD ON: RBC / HPF: NONE SEEN RBC/hpf (ref 0–5)

## 2015-12-13 LAB — COMPREHENSIVE METABOLIC PANEL
ALT: 10 U/L — AB (ref 14–54)
ANION GAP: 10 (ref 5–15)
AST: 25 U/L (ref 15–41)
Albumin: 3.4 g/dL — ABNORMAL LOW (ref 3.5–5.0)
Alkaline Phosphatase: 69 U/L (ref 38–126)
BUN: 34 mg/dL — ABNORMAL HIGH (ref 6–20)
CHLORIDE: 105 mmol/L (ref 101–111)
CO2: 26 mmol/L (ref 22–32)
CREATININE: 2.57 mg/dL — AB (ref 0.44–1.00)
Calcium: 10 mg/dL (ref 8.9–10.3)
GFR, EST AFRICAN AMERICAN: 17 mL/min — AB (ref >60)
GFR, EST NON AFRICAN AMERICAN: 14 mL/min — AB (ref >60)
Glucose, Bld: 98 mg/dL (ref 65–99)
Potassium: 3.5 mmol/L (ref 3.5–5.1)
Sodium: 141 mmol/L (ref 135–145)
Total Bilirubin: 0.5 mg/dL (ref 0.3–1.2)
Total Protein: 7 g/dL (ref 6.5–8.1)

## 2015-12-13 LAB — CBC
HCT: 30.8 % — ABNORMAL LOW (ref 36.0–46.0)
Hemoglobin: 9.9 g/dL — ABNORMAL LOW (ref 12.0–15.0)
MCH: 29.6 pg (ref 26.0–34.0)
MCHC: 32.1 g/dL (ref 30.0–36.0)
MCV: 91.9 fL (ref 78.0–100.0)
PLATELETS: 349 10*3/uL (ref 150–400)
RBC: 3.35 MIL/uL — AB (ref 3.87–5.11)
RDW: 13.7 % (ref 11.5–15.5)
WBC: 8.7 10*3/uL (ref 4.0–10.5)

## 2015-12-13 LAB — TROPONIN I: Troponin I: 0.04 ng/mL (ref <0.03)

## 2015-12-13 LAB — LIPASE, BLOOD: LIPASE: 33 U/L (ref 11–51)

## 2015-12-13 MED ORDER — LEVOTHYROXINE SODIUM 75 MCG PO TABS
75.0000 ug | ORAL_TABLET | Freq: Every day | ORAL | Status: DC
  Filled 2015-12-13: qty 1

## 2015-12-13 MED ORDER — SODIUM CHLORIDE 0.9 % IV BOLUS (SEPSIS)
250.0000 mL | Freq: Once | INTRAVENOUS | Status: AC
  Administered 2015-12-13: 250 mL via INTRAVENOUS

## 2015-12-13 MED ORDER — DEXTROSE-NACL 5-0.9 % IV SOLN
INTRAVENOUS | Status: DC
  Administered 2015-12-13: 21:00:00 via INTRAVENOUS

## 2015-12-13 MED ORDER — ACETAMINOPHEN 500 MG PO TABS: 500.0000 mg | ORAL_TABLET | Freq: Every evening | ORAL | Status: DC | PRN

## 2015-12-13 MED ORDER — POLYETHYLENE GLYCOL 3350 17 G PO PACK: 17.0000 g | PACK | Freq: Every day | ORAL | Status: DC

## 2015-12-13 MED ORDER — ONDANSETRON HCL 4 MG/2ML IJ SOLN: 4.0000 mg | Freq: Once | INTRAMUSCULAR | Status: DC | PRN

## 2015-12-13 MED ORDER — METOPROLOL SUCCINATE ER 50 MG PO TB24: 100.0000 mg | ORAL_TABLET | Freq: Every evening | ORAL | Status: DC

## 2015-12-13 MED ORDER — LATANOPROST 0.005 % OP SOLN
1.0000 [drp] | Freq: Every day | OPHTHALMIC | Status: DC
  Filled 2015-12-13: qty 2.5

## 2015-12-13 MED ORDER — HEPARIN SODIUM (PORCINE) 5000 UNIT/ML IJ SOLN
5000.0000 [IU] | Freq: Three times a day (TID) | INTRAMUSCULAR | Status: DC
  Administered 2015-12-13 – 2015-12-15 (×5): 5000 [IU] via SUBCUTANEOUS
  Filled 2015-12-13 (×5): qty 1

## 2015-12-13 MED ORDER — PANTOPRAZOLE SODIUM 40 MG PO TBEC: 40.0000 mg | DELAYED_RELEASE_TABLET | Freq: Every day | ORAL | Status: DC

## 2015-12-13 MED ORDER — AMLODIPINE BESYLATE 5 MG PO TABS: 10.0000 mg | ORAL_TABLET | Freq: Every day | ORAL | Status: DC

## 2015-12-13 MED ORDER — SERTRALINE HCL 50 MG PO TABS: 100.0000 mg | ORAL_TABLET | Freq: Every day | ORAL | Status: DC

## 2015-12-13 MED ORDER — CLOPIDOGREL BISULFATE 75 MG PO TABS: 75.0000 mg | ORAL_TABLET | Freq: Every day | ORAL | Status: DC

## 2015-12-13 MED ORDER — ADULT MULTIVITAMIN W/MINERALS CH: 1.0000 | ORAL_TABLET | Freq: Every day | ORAL | Status: DC

## 2015-12-13 MED ORDER — SODIUM CHLORIDE 0.9 % IV SOLN
INTRAVENOUS | Status: DC
  Administered 2015-12-13: 16:00:00 via INTRAVENOUS

## 2015-12-13 MED ORDER — DOCUSATE SODIUM 100 MG PO CAPS: 100.0000 mg | ORAL_CAPSULE | Freq: Two times a day (BID) | ORAL | Status: DC

## 2015-12-13 MED ORDER — NITROGLYCERIN 0.4 MG SL SUBL: 0.4000 mg | SUBLINGUAL_TABLET | SUBLINGUAL | Status: DC | PRN

## 2015-12-13 NOTE — ED Provider Notes (Signed)
[facility] DEPT Provider Note   CSN: [id number] Arrival date & time: 12/13/15  1150     History   Chief Complaint Chief Complaint  Patient presents with  . Diarrhea    HPI [name] [name] is a 82 y.o. female.  HPI  Pt was seen at 1305. Per pt and her family, c/o gradual onset and persistence of multiple intermittent episodes of N/V/D that began this morning. Has been associated with "chills." Pt's family states pt fell yesterday, landing on her right side. Pt c/o right elbow, hand, and hip pain. Hip pain worsens with weight bearing. Family states pt "hit her head" when she fell. Denies CP/palpitations, no SOB/cough, no abd pain, no objective fevers, no black or blood in stools or emesis.   Past Medical History:  Diagnosis Date  . Anemia, unspecified   . Aortic stenosis   . Backache, unspecified   . COPD (chronic obstructive pulmonary disease) ([place])   . Dizziness and giddiness   . Osteoporosis   . Stroke ([place])    Tia  . Thyrotoxicosis without mention of goiter or other cause, without mention of thyrotoxic crisis or storm   . TIA (transient ischemic attack)   . Unspecified disorder resulting from impaired renal function   . Unspecified essential hypertension     Patient Active Problem List   Diagnosis Date Noted  . TIA (transient ischemic attack) 06/16/2015  . Brain TIA 06/16/2015  . Acute encephalopathy 06/06/2015  . Herpes zoster 06/06/2015  . CKD stage 4 secondary to hypertension ([place]) 06/06/2015  . Volume depletion 06/06/2015  . Essential hypertension 06/06/2015  . Blindness due to macular degeneration 06/06/2015  . DNR (do not resuscitate) 06/06/2015  . Pyuria 06/06/2015  . CAROTID ARTERY STENOSIS, WITHOUT INFARCTION 12/22/2008  . HYPERTHYROIDISM 12/19/2008  . ANEMIA 12/19/2008  . HYPERTENSION 12/19/2008  . Aortic valve disorder 12/19/2008  . RENAL INSUFFICIENCY 12/19/2008  . BACK PAIN 12/19/2008  . DIZZINESS 12/19/2008    Past Surgical History:    Procedure Laterality Date  . BACK SURGERY     Kyphoplasty  . SALIVARY GLAND SURGERY Left   . small bowel obst      OB History    No data available       Home Medications    Prior to Admission medications   Medication Sig Start Date End Date Taking? Authorizing Provider  acetaminophen (TYLENOL) 500 MG tablet Take 500 mg by mouth at bedtime as needed for mild pain or moderate pain.    Yes Historical Provider, MD  amLODipine (NORVASC) 10 MG tablet TAKE ONE TABLET BY MOUTH ONCE DAILY. 04/30/15  Yes Arnoldo Lenis, MD  clopidogrel (PLAVIX) 75 MG tablet Take 1 tablet (75 mg total) by mouth daily. 06/18/15  Yes Estela Leonie Green, MD  darbepoetin alfa-polysorbate (ARANESP, ALBUMIN FREE,) 100 MCG/ML injection Inject 1 mL (100 mcg total) into the skin once. 08/14/10  Yes Fran Lowes, MD  docusate sodium (COLACE) 100 MG capsule Take 100 mg by mouth 2 (two) times daily.     Yes Historical Provider, MD  hydrochlorothiazide (HYDRODIURIL) 25 MG tablet Take 12.5 mg by mouth daily as needed (fluid).  05/07/15  Yes Historical Provider, MD  latanoprost (XALATAN) 0.005 % ophthalmic solution Place 1 drop into both eyes at bedtime.  07/28/11  Yes Historical Provider, MD  levothyroxine (SYNTHROID, LEVOTHROID) 75 MCG tablet Take 1 tablet by mouth daily. 04/28/15  Yes Historical Provider, MD  metoprolol succinate (TOPROL-XL) 50 MG 24 hr tablet Take 1  tablet (50 mg total) by mouth daily. Take with or immediately following a meal. Patient taking differently: Take 100 mg by mouth every evening. Take with or immediately following a meal. 06/07/14  Yes Arnoldo Lenis, MD  Multiple Vitamins-Minerals (MULTIVITAMIN WITH MINERALS) tablet Take 1 tablet by mouth daily.     Yes Historical Provider, MD  nitroGLYCERIN (NITROSTAT) 0.4 MG SL tablet Place 1 tablet (0.4 mg total) under the tongue every 5 (five) minutes as needed for chest pain. 06/07/14  Yes Arnoldo Lenis, MD  pantoprazole (PROTONIX) 40 MG tablet  Take 40 mg by mouth daily.   Yes Historical Provider, MD  polyethylene glycol (MIRALAX / GLYCOLAX) packet Take 17 g by mouth at bedtime.    Yes Historical Provider, MD  sertraline (ZOLOFT) 100 MG tablet Take 100 mg by mouth daily.   Yes Historical Provider, MD    Family History History reviewed. No pertinent family history.  Social History Social History  Substance Use Topics  . Smoking status: Never Smoker  . Smokeless tobacco: Never Used  . Alcohol use No     Allergies   Neosporin [neomycin-bacitracin zn-polymyx]; Nitrofurantoin monohyd macro; Valtrex [valacyclovir hcl]; and Penicillins   Review of Systems Review of Systems ROS: Statement: All systems negative except as marked or noted in the HPI; Constitutional: Negative for fever and chills. ; ; Eyes: Negative for eye pain, redness and discharge. ; ; ENMT: Negative for ear pain, hoarseness, nasal congestion, sinus pressure and sore throat. ; ; Cardiovascular: Negative for chest pain, palpitations, diaphoresis, dyspnea and peripheral edema. ; ; Respiratory: Negative for cough, wheezing and stridor. ; ; Gastrointestinal: +N/V/D. Negative for abdominal pain, blood in stool, hematemesis, jaundice and rectal bleeding. . ; ; Genitourinary: Negative for dysuria, flank pain and hematuria. ; ; Musculoskeletal: +head injury, right elbow, hand, hip pain. Negative for back pain and neck pain. Negative for swelling and deformity.;; Skin: Negative for pruritus, rash, abrasions, blisters, bruising and skin lesion.; ; Neuro: Negative for headache, lightheadedness and neck stiffness. Negative for weakness, altered level of consciousness, altered mental status, extremity weakness, paresthesias, involuntary movement, seizure and syncope.       Physical Exam Updated Vital Signs BP 182/74   Pulse 73   Temp 98.7 F (37.1 C) (Rectal)   Resp 22   SpO2 96%     15:45 Orthostatic Vital Signs JM  Orthostatic Lying   BP- Lying: 154/71  Pulse- Lying:  80      Orthostatic Sitting  BP- Sitting: 112/71  Pulse- Sitting: 93      Orthostatic Standing at 0 minutes  BP- Standing at 0 minutes: 97/68  Pulse- Standing at 0 minutes: 108    Physical Exam 1310: Physical examination:  Nursing notes reviewed; Vital signs and O2 SAT reviewed;  Constitutional: Well developed, Well nourished, In no acute distress; Head:  Normocephalic, atraumatic; Eyes: EOMI, PERRL, No scleral icterus; ENMT: Mouth and pharynx normal, Mucous membranes dry; Neck: Supple, Full range of motion, No lymphadenopathy; Cardiovascular: Regular rate and rhythm, No gallop; Respiratory: Breath sounds clear & equal bilaterally, No wheezes.  Speaking full sentences with ease, Normal respiratory effort/excursion; Chest: Nontender, NT right clavicle. Movement normal; Abdomen: Soft, Nontender, Nondistended, Normal bowel sounds; Genitourinary: No CVA tenderness; Extremities: Pulses normal, pelvis stable. NT right shoulder/wrist. +generalized TTP right elbow and hand, ecchymosis to right thumb area, no erythema, no deformity, right fingers contracted per hx. +TTP right hip, no deformity, pt is able to lift extended RLE off stretcher. NT right  knee/ankle/foot. No edema, No calf edema or asymmetry.; Neuro: AA&Ox3, +HOH, otherwise major CN grossly intact.  No facial droop. Speech clear. No gross focal motor or sensory deficits in extremities.; Skin: Color normal, Warm, Dry.   ED Treatments / Results  Labs (all labs ordered are listed, but only abnormal results are displayed)   EKG  EKG Interpretation  Date/Time:  Thursday December 13 2015 11:56:12 EST Ventricular Rate:  75 PR Interval:    QRS Duration: 90 QT Interval:  343 QTC Calculation: 383 R Axis:   -30 Text Interpretation:  Sinus rhythm Atrial premature complex Short PR interval Abnormal R-wave progression, early transition Left ventricular hypertrophy Abnormal T, consider ischemia, lateral leads Baseline wander Artifact When  compared with ECG of 06/16/2015 QT has shortened Confirmed by [facility]  MD, Nunzio Cory [phone]) on 12/13/2015 1:48:08 PM       Radiology   Procedures Procedures (including critical care time)  Medications Ordered in ED Medications  ondansetron (ZOFRAN) injection 4 mg (not administered)     Initial Impression / Assessment and Plan / ED Course  I have reviewed the triage vital signs and the nursing notes.  Pertinent labs & imaging results that were available during my care of the patient were reviewed by me and considered in my medical decision making (see chart for details).  MDM Reviewed: previous chart, nursing note and vitals Reviewed previous: labs and ECG Interpretation: labs, ECG, x-ray and CT scan   Results for orders placed or performed during the hospital encounter of 12/13/15  Lipase, blood  Result Value Ref Range   Lipase 33 11 - 51 U/L  Comprehensive metabolic panel  Result Value Ref Range   Sodium 141 135 - 145 mmol/L   Potassium 3.5 3.5 - 5.1 mmol/L   Chloride 105 101 - 111 mmol/L   CO2 26 22 - 32 mmol/L   Glucose, Bld 98 65 - 99 mg/dL   BUN 34 (H) 6 - 20 mg/dL   Creatinine, Ser 2.57 (H) 0.44 - 1.00 mg/dL   Calcium 10.0 8.9 - 10.3 mg/dL   Total Protein 7.0 6.5 - 8.1 g/dL   Albumin 3.4 (L) 3.5 - 5.0 g/dL   AST 25 15 - 41 U/L   ALT 10 (L) 14 - 54 U/L   Alkaline Phosphatase 69 38 - 126 U/L   Total Bilirubin 0.5 0.3 - 1.2 mg/dL   GFR calc non Af Amer 14 (L) >60 mL/min   GFR calc Af Amer 17 (L) >60 mL/min   Anion gap 10 5 - 15  CBC  Result Value Ref Range   WBC 8.7 4.0 - 10.5 K/uL   RBC 3.35 (L) 3.87 - 5.11 MIL/uL   Hemoglobin 9.9 (L) 12.0 - 15.0 g/dL   HCT 30.8 (L) 36.0 - 46.0 %   MCV 91.9 78.0 - 100.0 fL   MCH 29.6 26.0 - 34.0 pg   MCHC 32.1 30.0 - 36.0 g/dL   RDW 13.7 11.5 - 15.5 %   Platelets 349 150 - 400 K/uL  Lactic acid, plasma  Result Value Ref Range   Lactic Acid, Venous 1.0 0.5 - 1.9 mmol/L  Troponin I  Result Value Ref Range   Troponin  I 0.04 (HH) <0.03 ng/mL   Dg Chest 1 View Result Date: 12/13/2015 CLINICAL DATA:  Right hand pain, medial right elbow pain, status post fall EXAM: CHEST 1 VIEW COMPARISON:  06/26/2015 FINDINGS: There is left apical calcified pleural plaque. There is no focal parenchymal opacity. There is  no pleural effusion or pneumothorax. The heart and mediastinal contours are unremarkable. Chronic mid and lower thoracic vertebral body compression fractures with vertebral body augmentation at T12. IMPRESSION: No active disease. Electronically Signed   By: Kathreen Devoid   On: 12/13/2015 14:41   Dg Elbow Complete Right Result Date: 12/13/2015 CLINICAL DATA:  Right elbow pain due to a fall today. Initial encounter. EXAM: RIGHT ELBOW - COMPLETE 3+ VIEW COMPARISON:  None. FINDINGS: Bones are osteopenic. No fractures identified. Degenerative disease about the elbow appears advanced at the radiocapitellar joint. There is chondrocalcinosis present. No joint effusion is seen. IMPRESSION: No acute abnormality. Osteoarthritis appearing worst at the radio capitellar joint. Osteopenia. Electronically Signed   By: Inge Rise M.D.   On: 12/13/2015 14:38   Ct Head Wo Contrast Result Date: 12/13/2015 CLINICAL DATA:  Pain following fall 1 day prior. Nausea and vomiting EXAM: CT HEAD WITHOUT CONTRAST CT CERVICAL SPINE WITHOUT CONTRAST TECHNIQUE: Multidetector CT imaging of the head and cervical spine was performed following the standard protocol without intravenous contrast. Multiplanar CT image reconstructions of the cervical spine were also generated. COMPARISON:  CT cervical spine April 26, 2012; head CT Jun 26, 2015 FINDINGS: CT HEAD FINDINGS Brain: There is moderate diffuse atrophy. There is no intracranial mass, hemorrhage, extra-axial fluid collection, or midline shift. There is evidence of a prior small infarct in the periphery of the left cerebellar hemisphere, stable. There is evidence of a small prior infarct in the  posterior inferior right cerebellum. There is small vessel disease throughout the centra semiovale bilaterally, stable. There is no new gray-Gerke compartment lesion. No acute infarct is evident. Vascular: There is extensive calcification in the carotid siphon regions as well as in both proximal middle cerebral arteries. There is mild ectasia of the proximal left middle cerebral artery, stable. No hyperdense vessel is evident. Skull: Bones are osteoporotic.  The bony calvarium appears intact. Sinuses/Orbits: There is mucosal thickening with an air-fluid level in the left sphenoid sinus. There is mild mucosal thickening in each posterior maxillary antrum. No intraorbital lesions are evident. Note that patient has had previous cataract for section on the right. Other: Mastoid air cells are clear. CT CERVICAL SPINE FINDINGS Alignment: There is minimal retrolisthesis of C5 on C6. There is 2 mm of anterolisthesis of C7 on T1. There is 2 mm of anterolisthesis of T1 on T2. These changes are felt to be due to underlying spondylosis. Skull base and vertebrae: Skull base and craniocervical junction regions appear unremarkable. There is no appreciable fracture in the cervical region. However, there is anterior wedging of the T4 vertebral body which appears chronic. Bones are osteoporotic. There are no blastic or lytic bone lesions evident. Soft tissues and spinal canal: Prevertebral soft tissues and predental space regions are normal. There is no paraspinous lesion. There is no high-grade spinal stenosis. Disc levels: There is marked disc space narrowing at C4-5, C5-6, and C6-7. There is mild to moderate disc space narrowing at all other levels. There is facet hypertrophy at all levels. There is exit foraminal narrowing due to bony hypertrophy at C3-4 on the right, marked, at C4-5 on the right, at C5-6 bilaterally, more common at C6-7 bilaterally, marked. There is impression on the exiting nerve roots at these sites due to  bony hypertrophy. There is no frank disc extrusion. Upper chest: There is apical lung scarring bilaterally. There is parietal pleural thickening on the left suggesting previous asbestos exposure. Other: There are carotid artery calcifications bilaterally. IMPRESSION: CT  head: Stable atrophy with prior small infarcts in the cerebellum bilaterally as well as periventricular small vessel disease in the supratentorial Bartnick matter. No acute infarct evident. No hemorrhage, mass, or extra-axial fluid collection. Air-fluid level noted in left sphenoid sinus. Foci of arterial vascular calcification noted. Bones somewhat osteoporotic. CT cervical spine: No acute fracture. Probable old fracture of the T4 vertebral body. Mild spondylolisthesis at several levels in the cervical region is stable and felt to be due to underlying spondylosis. There is multilevel arthropathy. There are foci of vascular calcification as well as scarring in the apices. Parietal pleural thickening on the left raises question of prior asbestos exposure. Electronically Signed   By: Lowella Grip III M.D.   On: 12/13/2015 15:17   Ct Cervical Spine Wo Contrast Result Date: 12/13/2015 CLINICAL DATA:  Pain following fall 1 day prior. Nausea and vomiting EXAM: CT HEAD WITHOUT CONTRAST CT CERVICAL SPINE WITHOUT CONTRAST TECHNIQUE: Multidetector CT imaging of the head and cervical spine was performed following the standard protocol without intravenous contrast. Multiplanar CT image reconstructions of the cervical spine were also generated. COMPARISON:  CT cervical spine April 26, 2012; head CT Jun 26, 2015 FINDINGS: CT HEAD FINDINGS Brain: There is moderate diffuse atrophy. There is no intracranial mass, hemorrhage, extra-axial fluid collection, or midline shift. There is evidence of a prior small infarct in the periphery of the left cerebellar hemisphere, stable. There is evidence of a small prior infarct in the posterior inferior right cerebellum.  There is small vessel disease throughout the centra semiovale bilaterally, stable. There is no new gray-Soyars compartment lesion. No acute infarct is evident. Vascular: There is extensive calcification in the carotid siphon regions as well as in both proximal middle cerebral arteries. There is mild ectasia of the proximal left middle cerebral artery, stable. No hyperdense vessel is evident. Skull: Bones are osteoporotic.  The bony calvarium appears intact. Sinuses/Orbits: There is mucosal thickening with an air-fluid level in the left sphenoid sinus. There is mild mucosal thickening in each posterior maxillary antrum. No intraorbital lesions are evident. Note that patient has had previous cataract for section on the right. Other: Mastoid air cells are clear. CT CERVICAL SPINE FINDINGS Alignment: There is minimal retrolisthesis of C5 on C6. There is 2 mm of anterolisthesis of C7 on T1. There is 2 mm of anterolisthesis of T1 on T2. These changes are felt to be due to underlying spondylosis. Skull base and vertebrae: Skull base and craniocervical junction regions appear unremarkable. There is no appreciable fracture in the cervical region. However, there is anterior wedging of the T4 vertebral body which appears chronic. Bones are osteoporotic. There are no blastic or lytic bone lesions evident. Soft tissues and spinal canal: Prevertebral soft tissues and predental space regions are normal. There is no paraspinous lesion. There is no high-grade spinal stenosis. Disc levels: There is marked disc space narrowing at C4-5, C5-6, and C6-7. There is mild to moderate disc space narrowing at all other levels. There is facet hypertrophy at all levels. There is exit foraminal narrowing due to bony hypertrophy at C3-4 on the right, marked, at C4-5 on the right, at C5-6 bilaterally, more common at C6-7 bilaterally, marked. There is impression on the exiting nerve roots at these sites due to bony hypertrophy. There is no frank disc  extrusion. Upper chest: There is apical lung scarring bilaterally. There is parietal pleural thickening on the left suggesting previous asbestos exposure. Other: There are carotid artery calcifications bilaterally. IMPRESSION: CT head: Stable  atrophy with prior small infarcts in the cerebellum bilaterally as well as periventricular small vessel disease in the supratentorial Vanegas matter. No acute infarct evident. No hemorrhage, mass, or extra-axial fluid collection. Air-fluid level noted in left sphenoid sinus. Foci of arterial vascular calcification noted. Bones somewhat osteoporotic. CT cervical spine: No acute fracture. Probable old fracture of the T4 vertebral body. Mild spondylolisthesis at several levels in the cervical region is stable and felt to be due to underlying spondylosis. There is multilevel arthropathy. There are foci of vascular calcification as well as scarring in the apices. Parietal pleural thickening on the left raises question of prior asbestos exposure. Electronically Signed   By: Lowella Grip III M.D.   On: 12/13/2015 15:17   Dg Hand Complete Right Result Date: 12/13/2015 CLINICAL DATA:  Status post fall.  Right hand and wrist pain. EXAM: RIGHT HAND - COMPLETE 3+ VIEW COMPARISON:  None. FINDINGS: Bones are osteopenic. No acute bony or joint abnormality is identified. Flexion deformities of all the fingers are noted in reportedly chronic. The patient has advanced first [facility] and scaphoid trapezium trapezoid joint osteoarthritis. Degenerative change about the DIP joints of the fingers is also identified. Chondrocalcinosis about the wrist is noted. IMPRESSION: No acute abnormality. Multifocal osteoarthritis. Osteopenia. Chondrocalcinosis. Electronically Signed   By: Inge Rise M.D.   On: 12/13/2015 14:36   Dg Hip Unilat With Pelvis 2-3 Views Right Result Date: 12/13/2015 CLINICAL DATA:  Fall yesterday in the bathroom at home, right hip pain since that time. EXAM: DG HIP (WITH  OR WITHOUT PELVIS) 2-3V RIGHT COMPARISON:  None. FINDINGS: The cortical margins of the bony pelvis are intact. No fracture. Pubic symphysis and sacroiliac joints are congruent. Both femoral heads are well-seated in the respective acetabula. Chondrocalcinosis of the pubic symphysis. The bones appear under mineralized. IMPRESSION: No evidence of fracture of the pelvis or right hip. Electronically Signed   By: Jeb Levering M.D.   On: 12/13/2015 14:35    Results for DIJONNAISE, MEZQUITA (MRN [id number]) as of 12/13/2015 15:48  Ref. Range 06/26/2015 16:25 06/26/2015 16:52 07/26/2015 13:00 10/08/2015 12:58 12/13/2015 12:20  BUN Latest Ref Range: 6 - 20 mg/dL 24 (H) 26 (H) 32 (H) 23 (H) 34 (H)  Creatinine Latest Ref Range: 0.44 - 1.00 mg/dL 1.86 (H) 1.90 (H) 1.86 (H) 1.86 (H) 2.57 (H)    1625:  Orthostatic on VS, BUN/Cr elevated; judicious IVF given. No stooling while in the ED, abd remains benign. Will in and out cath for urine. Troponin elevated; pt denies CP. Dx and testing d/w pt and family.  Questions answered.  Verb understanding, agreeable to observation admit.  T/C to [facility]. Marin Comment, case discussed, including:  HPI, pertinent PM/SHx, VS/PE, dx testing, ED course and treatment:  Agreeable to admit, requests he will come to the ED for evaluation.    Final Clinical Impressions(s) / ED Diagnoses   Final diagnoses:  None    New Prescriptions New Prescriptions   No medications on file     Francine Graven, DO 12/15/15 2036

## 2015-12-13 NOTE — ED Notes (Signed)
Pt stable and ready for transport to 307. Report given to Will, [facility], RN.

## 2015-12-13 NOTE — ED Notes (Signed)
Notified that [facility] bed was not ready.

## 2015-12-13 NOTE — ED Triage Notes (Addendum)
Family report pt had a TIA on Monday at home and a Fall on Wednesday hitting back of head and right hip. Pt presents today with Nausea, vomiting x 3, and diarrhea x 2.

## 2015-12-13 NOTE — ED Notes (Signed)
CRITICAL VALUE ALERT  Critical value received:  Troponin 0.04  Date of notification:  12/13/15  Time of notification:  [id number]  Critical value read back:Yes.    Nurse who received alert:  Norm Salt, RN  MD notified (1st page):  Dr. Thurnell Garbe  Time of first page:  22  MD notified (2nd page):  Time of second page:  Responding MD:  Dr. Thurnell Garbe  Time MD responded:  [phone]

## 2015-12-13 NOTE — H&P (Signed)
History and Physical    [name] [name] [id number] DOB: November 22, 1914 DOA: 12/13/2015  PCP: Wende Neighbors, MD  Patient coming from: Home.    Chief Complaint:  [place] yesterday, weakness.   HPI: [name] [name] is an 82 y.o. female lives at home with her daughter, code status DNR, admitted in May 2017 for TIA by me, hx of AS, COPD, chronic anemia, CKD, fell yesterday, and presented to the ER today with weakness.  Evaluation in the ER included head CT, cervical CT, hip, right hand, CXR showed no acute processes.  UA is pending.  Her Cr was elevated to 2.5, compared to her baseline Cr of 1.8, normal lactic acid.  Given her advanced age, and elevated Cr above her baseline, Dr Thurnell Garbe asked me to admit her OBS, to give gentle IVF, and for physical therapy evaluation.  Her daughter would have been OK with her going home tonight.   ED Course:  See above.  Rewiew of Systems:  Constitutional: Negative for malaise, fever and chills. No significant weight loss or weight gain Eyes: Negative for eye pain, redness and discharge, diplopia, visual changes, or flashes of light. ENMT: Negative for ear pain, hoarseness, nasal congestion, sinus pressure and sore throat. No headaches; tinnitus, drooling, or problem swallowing. Cardiovascular: Negative for chest pain, palpitations, diaphoresis, dyspnea and peripheral edema. ; No orthopnea, PND Respiratory: Negative for cough, hemoptysis, wheezing and stridor. No pleuritic chestpain. Gastrointestinal: Negative for diarrhea, constipation,  melena, blood in stool, hematemesis, jaundice and rectal bleeding.    Genitourinary: Negative for frequency, dysuria, incontinence,flank pain and hematuria; Musculoskeletal: Negative for back pain and neck pain. Negative for swelling and trauma.;  Skin: . Negative for pruritus, rash, abrasions, bruising and skin lesion.; ulcerations Neuro: Negative for headache, lightheadedness and neck stiffness. Negative for altered level of  consciousness , altered mental status, extremity weakness, burning feet, involuntary movement, seizure and syncope.  Psych: negative for anxiety, depression, insomnia, tearfulness, panic attacks, hallucinations, paranoia, suicidal or homicidal ideation    Past Medical History:  Diagnosis Date  . Anemia, unspecified   . Aortic stenosis   . Backache, unspecified   . COPD (chronic obstructive pulmonary disease) ([place])   . Dizziness and giddiness   . Osteoporosis   . Stroke ([place])    Tia  . Thyrotoxicosis without mention of goiter or other cause, without mention of thyrotoxic crisis or storm   . TIA (transient ischemic attack)   . Unspecified disorder resulting from impaired renal function   . Unspecified essential hypertension     Past Surgical History:  Procedure Laterality Date  . BACK SURGERY     Kyphoplasty  . SALIVARY GLAND SURGERY Left   . small bowel obst       reports that she has never smoked. She has never used smokeless tobacco. She reports that she does not drink alcohol or use drugs.  Allergies  Allergen Reactions  . Neosporin [Neomycin-Bacitracin Zn-Polymyx] Rash  . Nitrofurantoin Monohyd Macro Nausea And Vomiting and Other (See Comments)    Fever, chills  . Valtrex [Valacyclovir Hcl] Other (See Comments)    Confusion, leg weakness, hallucinations.  . Penicillins Rash    Has patient had a PCN reaction causing immediate rash, facial/tongue/throat swelling, SOB or lightheadedness with hypotension: Yes Has patient had a PCN reaction causing severe rash involving mucus membranes or skin necrosis: No Has patient had a PCN reaction that required hospitalization No Has patient had a PCN reaction occurring within the last 10 years: No If  all of the above answers are "NO", then may proceed with Cephalosporin use.     History reviewed. No pertinent family history.   Prior to Admission medications   Medication Sig Start Date End Date Taking? Authorizing Provider    acetaminophen (TYLENOL) 500 MG tablet Take 500 mg by mouth at bedtime as needed for mild pain or moderate pain.    Yes Historical Provider, MD  amLODipine (NORVASC) 10 MG tablet TAKE ONE TABLET BY MOUTH ONCE DAILY. 04/30/15  Yes Arnoldo Lenis, MD  clopidogrel (PLAVIX) 75 MG tablet Take 1 tablet (75 mg total) by mouth daily. 06/18/15  Yes Estela Leonie Green, MD  darbepoetin alfa-polysorbate (ARANESP, ALBUMIN FREE,) 100 MCG/ML injection Inject 1 mL (100 mcg total) into the skin once. 08/14/10  Yes Fran Lowes, MD  docusate sodium (COLACE) 100 MG capsule Take 100 mg by mouth 2 (two) times daily.     Yes Historical Provider, MD  hydrochlorothiazide (HYDRODIURIL) 25 MG tablet Take 12.5 mg by mouth daily as needed (fluid).  05/07/15  Yes Historical Provider, MD  latanoprost (XALATAN) 0.005 % ophthalmic solution Place 1 drop into both eyes at bedtime.  07/28/11  Yes Historical Provider, MD  levothyroxine (SYNTHROID, LEVOTHROID) 75 MCG tablet Take 1 tablet by mouth daily. 04/28/15  Yes Historical Provider, MD  metoprolol succinate (TOPROL-XL) 50 MG 24 hr tablet Take 1 tablet (50 mg total) by mouth daily. Take with or immediately following a meal. Patient taking differently: Take 100 mg by mouth every evening. Take with or immediately following a meal. 06/07/14  Yes Arnoldo Lenis, MD  Multiple Vitamins-Minerals (MULTIVITAMIN WITH MINERALS) tablet Take 1 tablet by mouth daily.     Yes Historical Provider, MD  nitroGLYCERIN (NITROSTAT) 0.4 MG SL tablet Place 1 tablet (0.4 mg total) under the tongue every 5 (five) minutes as needed for chest pain. 06/07/14  Yes Arnoldo Lenis, MD  pantoprazole (PROTONIX) 40 MG tablet Take 40 mg by mouth daily.   Yes Historical Provider, MD  polyethylene glycol (MIRALAX / GLYCOLAX) packet Take 17 g by mouth at bedtime.    Yes Historical Provider, MD  sertraline (ZOLOFT) 100 MG tablet Take 100 mg by mouth daily.   Yes Historical Provider, MD    Physical  Exam: Vitals:   12/13/15 1500 12/13/15 1515 12/13/15 1530 12/13/15 1600  BP:   138/73 157/72  Pulse: 75 77 78   Resp: 21 19 16  (!) 28  Temp:      TempSrc:      SpO2: 95% 97% 97%       Constitutional: NAD, calm, comfortable Vitals:   12/13/15 1500 12/13/15 1515 12/13/15 1530 12/13/15 1600  BP:   138/73 157/72  Pulse: 75 77 78   Resp: 21 19 16  (!) 28  Temp:      TempSrc:      SpO2: 95% 97% 97%    Eyes: PERRL, lids and conjunctivae normal ENMT: Mucous membranes are moist. Posterior pharynx clear of any exudate or lesions.Normal dentition.  Neck: normal, supple, no masses, no thyromegaly Respiratory: clear to auscultation bilaterally, no wheezing, no crackles. Normal respiratory effort. No accessory muscle use.  Cardiovascular: Regular rate and rhythm, no murmurs / rubs / gallops. No extremity edema. 2+ pedal pulses. No carotid bruits.  Abdomen: no tenderness, no masses palpated. No hepatosplenomegaly. Bowel sounds positive.  Musculoskeletal: no clubbing / cyanosis. No joint deformity upper and lower extremities. Good ROM, no contractures. Normal muscle tone.  Skin: no rashes, lesions, ulcers.  No induration Neurologic: CN 2-12 grossly intact. Sensation intact, DTR normal. Strength 5/5 in all 4.  Psychiatric: Normal judgment and insight. Alert and oriented x 3. Normal mood.     Labs on Admission: I have personally reviewed following labs and imaging studies  CBC:  Recent Labs Lab 12/13/15 1220  WBC 8.7  HGB 9.9*  HCT 30.8*  MCV 91.9  PLT 0000000   Basic Metabolic Panel:  Recent Labs Lab 12/13/15 1220  NA 141  K 3.5  CL 105  CO2 26  GLUCOSE 98  BUN 34*  CREATININE 2.57*  CALCIUM 10.0   GFR: CrCl cannot be calculated (Unknown ideal weight.). Liver Function Tests:  Recent Labs Lab 12/13/15 1220  AST 25  ALT 10*  ALKPHOS 69  BILITOT 0.5  PROT 7.0  ALBUMIN 3.4*    Recent Labs Lab 12/13/15 1220  LIPASE 33   Cardiac Enzymes:  Recent Labs Lab  12/13/15 1339  TROPONINI 0.04*   Urine analysis:    Component Value Date/Time   COLORURINE YELLOW 06/26/2015 1609   APPEARANCEUR CLOUDY (A) 06/26/2015 1609   LABSPEC 1.018 06/26/2015 1609   PHURINE 7.0 06/26/2015 1609   GLUCOSEU NEGATIVE 06/26/2015 1609   HGBUR NEGATIVE 06/26/2015 1609   BILIRUBINUR NEGATIVE 06/26/2015 1609   KETONESUR NEGATIVE 06/26/2015 1609   PROTEINUR NEGATIVE 06/26/2015 1609   UROBILINOGEN 0.2 03/16/2014 1510   NITRITE NEGATIVE 06/26/2015 1609   LEUKOCYTESUR SMALL (A) 06/26/2015 1609   Radiological Exams on Admission: Dg Chest 1 View  Result Date: 12/13/2015 CLINICAL DATA:  Right hand pain, medial right elbow pain, status post fall EXAM: CHEST 1 VIEW COMPARISON:  06/26/2015 FINDINGS: There is left apical calcified pleural plaque. There is no focal parenchymal opacity. There is no pleural effusion or pneumothorax. The heart and mediastinal contours are unremarkable. Chronic mid and lower thoracic vertebral body compression fractures with vertebral body augmentation at T12. IMPRESSION: No active disease. Electronically Signed   By: Kathreen Devoid   On: 12/13/2015 14:41   Dg Elbow Complete Right  Result Date: 12/13/2015 CLINICAL DATA:  Right elbow pain due to a fall today. Initial encounter. EXAM: RIGHT ELBOW - COMPLETE 3+ VIEW COMPARISON:  None. FINDINGS: Bones are osteopenic. No fractures identified. Degenerative disease about the elbow appears advanced at the radiocapitellar joint. There is chondrocalcinosis present. No joint effusion is seen. IMPRESSION: No acute abnormality. Osteoarthritis appearing worst at the radio capitellar joint. Osteopenia. Electronically Signed   By: Inge Rise M.D.   On: 12/13/2015 14:38   Ct Head Wo Contrast  Result Date: 12/13/2015 CLINICAL DATA:  Pain following fall 1 day prior. Nausea and vomiting EXAM: CT HEAD WITHOUT CONTRAST CT CERVICAL SPINE WITHOUT CONTRAST TECHNIQUE: Multidetector CT imaging of the head and cervical  spine was performed following the standard protocol without intravenous contrast. Multiplanar CT image reconstructions of the cervical spine were also generated. COMPARISON:  CT cervical spine April 26, 2012; head CT Jun 26, 2015 FINDINGS: CT HEAD FINDINGS Brain: There is moderate diffuse atrophy. There is no intracranial mass, hemorrhage, extra-axial fluid collection, or midline shift. There is evidence of a prior small infarct in the periphery of the left cerebellar hemisphere, stable. There is evidence of a small prior infarct in the posterior inferior right cerebellum. There is small vessel disease throughout the centra semiovale bilaterally, stable. There is no new gray-Hagstrom compartment lesion. No acute infarct is evident. Vascular: There is extensive calcification in the carotid siphon regions as well as in both proximal middle cerebral  arteries. There is mild ectasia of the proximal left middle cerebral artery, stable. No hyperdense vessel is evident. Skull: Bones are osteoporotic.  The bony calvarium appears intact. Sinuses/Orbits: There is mucosal thickening with an air-fluid level in the left sphenoid sinus. There is mild mucosal thickening in each posterior maxillary antrum. No intraorbital lesions are evident. Note that patient has had previous cataract for section on the right. Other: Mastoid air cells are clear. CT CERVICAL SPINE FINDINGS Alignment: There is minimal retrolisthesis of C5 on C6. There is 2 mm of anterolisthesis of C7 on T1. There is 2 mm of anterolisthesis of T1 on T2. These changes are felt to be due to underlying spondylosis. Skull base and vertebrae: Skull base and craniocervical junction regions appear unremarkable. There is no appreciable fracture in the cervical region. However, there is anterior wedging of the T4 vertebral body which appears chronic. Bones are osteoporotic. There are no blastic or lytic bone lesions evident. Soft tissues and spinal canal: Prevertebral soft  tissues and predental space regions are normal. There is no paraspinous lesion. There is no high-grade spinal stenosis. Disc levels: There is marked disc space narrowing at C4-5, C5-6, and C6-7. There is mild to moderate disc space narrowing at all other levels. There is facet hypertrophy at all levels. There is exit foraminal narrowing due to bony hypertrophy at C3-4 on the right, marked, at C4-5 on the right, at C5-6 bilaterally, more common at C6-7 bilaterally, marked. There is impression on the exiting nerve roots at these sites due to bony hypertrophy. There is no frank disc extrusion. Upper chest: There is apical lung scarring bilaterally. There is parietal pleural thickening on the left suggesting previous asbestos exposure. Other: There are carotid artery calcifications bilaterally. IMPRESSION: CT head: Stable atrophy with prior small infarcts in the cerebellum bilaterally as well as periventricular small vessel disease in the supratentorial [place] matter. No acute infarct evident. No hemorrhage, mass, or extra-axial fluid collection. Air-fluid level noted in left sphenoid sinus. Foci of arterial vascular calcification noted. Bones somewhat osteoporotic. CT cervical spine: No acute fracture. Probable old fracture of the T4 vertebral body. Mild spondylolisthesis at several levels in the cervical region is stable and felt to be due to underlying spondylosis. There is multilevel arthropathy. There are foci of vascular calcification as well as scarring in the apices. Parietal pleural thickening on the left raises question of prior asbestos exposure. Electronically Signed   By: Lowella Grip III M.D.   On: 12/13/2015 15:17   Ct Cervical Spine Wo Contrast  Result Date: 12/13/2015 CLINICAL DATA:  Pain following fall 1 day prior. Nausea and vomiting EXAM: CT HEAD WITHOUT CONTRAST CT CERVICAL SPINE WITHOUT CONTRAST TECHNIQUE: Multidetector CT imaging of the head and cervical spine was performed following the  standard protocol without intravenous contrast. Multiplanar CT image reconstructions of the cervical spine were also generated. COMPARISON:  CT cervical spine April 26, 2012; head CT Jun 26, 2015 FINDINGS: CT HEAD FINDINGS Brain: There is moderate diffuse atrophy. There is no intracranial mass, hemorrhage, extra-axial fluid collection, or midline shift. There is evidence of a prior small infarct in the periphery of the left cerebellar hemisphere, stable. There is evidence of a small prior infarct in the posterior inferior right cerebellum. There is small vessel disease throughout the centra semiovale bilaterally, stable. There is no new gray-[place] compartment lesion. No acute infarct is evident. Vascular: There is extensive calcification in the carotid siphon regions as well as in both proximal middle cerebral arteries.  There is mild ectasia of the proximal left middle cerebral artery, stable. No hyperdense vessel is evident. Skull: Bones are osteoporotic.  The bony calvarium appears intact. Sinuses/Orbits: There is mucosal thickening with an air-fluid level in the left sphenoid sinus. There is mild mucosal thickening in each posterior maxillary antrum. No intraorbital lesions are evident. Note that patient has had previous cataract for section on the right. Other: Mastoid air cells are clear. CT CERVICAL SPINE FINDINGS Alignment: There is minimal retrolisthesis of C5 on C6. There is 2 mm of anterolisthesis of C7 on T1. There is 2 mm of anterolisthesis of T1 on T2. These changes are felt to be due to underlying spondylosis. Skull base and vertebrae: Skull base and craniocervical junction regions appear unremarkable. There is no appreciable fracture in the cervical region. However, there is anterior wedging of the T4 vertebral body which appears chronic. Bones are osteoporotic. There are no blastic or lytic bone lesions evident. Soft tissues and spinal canal: Prevertebral soft tissues and predental space regions are  normal. There is no paraspinous lesion. There is no high-grade spinal stenosis. Disc levels: There is marked disc space narrowing at C4-5, C5-6, and C6-7. There is mild to moderate disc space narrowing at all other levels. There is facet hypertrophy at all levels. There is exit foraminal narrowing due to bony hypertrophy at C3-4 on the right, marked, at C4-5 on the right, at C5-6 bilaterally, more common at C6-7 bilaterally, marked. There is impression on the exiting nerve roots at these sites due to bony hypertrophy. There is no frank disc extrusion. Upper chest: There is apical lung scarring bilaterally. There is parietal pleural thickening on the left suggesting previous asbestos exposure. Other: There are carotid artery calcifications bilaterally. IMPRESSION: CT head: Stable atrophy with prior small infarcts in the cerebellum bilaterally as well as periventricular small vessel disease in the supratentorial [place] matter. No acute infarct evident. No hemorrhage, mass, or extra-axial fluid collection. Air-fluid level noted in left sphenoid sinus. Foci of arterial vascular calcification noted. Bones somewhat osteoporotic. CT cervical spine: No acute fracture. Probable old fracture of the T4 vertebral body. Mild spondylolisthesis at several levels in the cervical region is stable and felt to be due to underlying spondylosis. There is multilevel arthropathy. There are foci of vascular calcification as well as scarring in the apices. Parietal pleural thickening on the left raises question of prior asbestos exposure. Electronically Signed   By: Lowella Grip III M.D.   On: 12/13/2015 15:17   Dg Hand Complete Right  Result Date: 12/13/2015 CLINICAL DATA:  Status post fall.  Right hand and wrist pain. EXAM: RIGHT HAND - COMPLETE 3+ VIEW COMPARISON:  None. FINDINGS: Bones are osteopenic. No acute bony or joint abnormality is identified. Flexion deformities of all the fingers are noted in reportedly chronic. The  patient has advanced first [facility] and scaphoid trapezium trapezoid joint osteoarthritis. Degenerative change about the DIP joints of the fingers is also identified. Chondrocalcinosis about the wrist is noted. IMPRESSION: No acute abnormality. Multifocal osteoarthritis. Osteopenia. Chondrocalcinosis. Electronically Signed   By: Inge Rise M.D.   On: 12/13/2015 14:36   Dg Hip Unilat With Pelvis 2-3 Views Right  Result Date: 12/13/2015 CLINICAL DATA:  Fall yesterday in the bathroom at home, right hip pain since that time. EXAM: DG HIP (WITH OR WITHOUT PELVIS) 2-3V RIGHT COMPARISON:  None. FINDINGS: The cortical margins of the bony pelvis are intact. No fracture. Pubic symphysis and sacroiliac joints are congruent. Both femoral heads are  well-seated in the respective acetabula. Chondrocalcinosis of the pubic symphysis. The bones appear under mineralized. IMPRESSION: No evidence of fracture of the pelvis or right hip. Electronically Signed   By: Jeb Levering M.D.   On: 12/13/2015 14:35    EKG: Independently reviewed.   Assessment/Plan Principal Problem:   Volume depletion Active Problems:   CKD stage 4 secondary to hypertension ([place])   Essential hypertension   DNR (do not resuscitate)   Weakness generalized   AKI (acute kidney injury) ([place])    PLAN:   Volume depletion:  We will be careful with her advanced age, and give gentle IVF.  Check Cr in the am.  Given her hx of AS, it is reasonable to avoid hypovolemia.  AKI from CKD:  Her current Cr is 2.6, baseline is about 1.9.  Suspect pre renal etiology.  HTN:  Follow BP.  Continue with meds.  Fall: Will request physical therapy evaluation, pending discharge tomorrow.    DVT prophylaxis: SubQ heparin. Code Status: DNR.  This was reconfirmed tonight. Family Communication: daughter at bedside.  Disposition Plan: likely home.  Consults called: None.  Admission status: OBS>   Tabria Steines MD FACP. [facility]  If 7PM-7AM,  please contact night-coverage [email or web address] Password [facility]  12/13/2015, 4:46 PM

## 2015-12-14 ENCOUNTER — Encounter (HOSPITAL_COMMUNITY): Payer: Self-pay

## 2015-12-14 DIAGNOSIS — N184 Chronic kidney disease, stage 4 (severe): Secondary | ICD-10-CM | POA: Diagnosis present

## 2015-12-14 DIAGNOSIS — R0602 Shortness of breath: Secondary | ICD-10-CM | POA: Diagnosis not present

## 2015-12-14 DIAGNOSIS — N390 Urinary tract infection, site not specified: Secondary | ICD-10-CM | POA: Diagnosis present

## 2015-12-14 DIAGNOSIS — J9601 Acute respiratory failure with hypoxia: Secondary | ICD-10-CM | POA: Diagnosis not present

## 2015-12-14 DIAGNOSIS — I129 Hypertensive chronic kidney disease with stage 1 through stage 4 chronic kidney disease, or unspecified chronic kidney disease: Secondary | ICD-10-CM | POA: Diagnosis not present

## 2015-12-14 DIAGNOSIS — Z880 Allergy status to penicillin: Secondary | ICD-10-CM | POA: Diagnosis not present

## 2015-12-14 DIAGNOSIS — R531 Weakness: Secondary | ICD-10-CM | POA: Diagnosis not present

## 2015-12-14 DIAGNOSIS — Z79899 Other long term (current) drug therapy: Secondary | ICD-10-CM | POA: Diagnosis not present

## 2015-12-14 DIAGNOSIS — J9621 Acute and chronic respiratory failure with hypoxia: Secondary | ICD-10-CM | POA: Diagnosis present

## 2015-12-14 DIAGNOSIS — I10 Essential (primary) hypertension: Secondary | ICD-10-CM

## 2015-12-14 DIAGNOSIS — E876 Hypokalemia: Secondary | ICD-10-CM | POA: Diagnosis present

## 2015-12-14 DIAGNOSIS — Z888 Allergy status to other drugs, medicaments and biological substances status: Secondary | ICD-10-CM | POA: Diagnosis not present

## 2015-12-14 DIAGNOSIS — Z7902 Long term (current) use of antithrombotics/antiplatelets: Secondary | ICD-10-CM | POA: Diagnosis not present

## 2015-12-14 DIAGNOSIS — Z8673 Personal history of transient ischemic attack (TIA), and cerebral infarction without residual deficits: Secondary | ICD-10-CM | POA: Diagnosis not present

## 2015-12-14 DIAGNOSIS — Z66 Do not resuscitate: Secondary | ICD-10-CM | POA: Diagnosis present

## 2015-12-14 DIAGNOSIS — I1310 Hypertensive heart and chronic kidney disease without heart failure, with stage 1 through stage 4 chronic kidney disease, or unspecified chronic kidney disease: Secondary | ICD-10-CM | POA: Diagnosis present

## 2015-12-14 DIAGNOSIS — E869 Volume depletion, unspecified: Secondary | ICD-10-CM | POA: Diagnosis not present

## 2015-12-14 DIAGNOSIS — J9811 Atelectasis: Secondary | ICD-10-CM | POA: Diagnosis present

## 2015-12-14 DIAGNOSIS — J449 Chronic obstructive pulmonary disease, unspecified: Secondary | ICD-10-CM | POA: Diagnosis present

## 2015-12-14 DIAGNOSIS — I350 Nonrheumatic aortic (valve) stenosis: Secondary | ICD-10-CM | POA: Diagnosis present

## 2015-12-14 DIAGNOSIS — R7989 Other specified abnormal findings of blood chemistry: Secondary | ICD-10-CM | POA: Diagnosis present

## 2015-12-14 DIAGNOSIS — E039 Hypothyroidism, unspecified: Secondary | ICD-10-CM | POA: Diagnosis present

## 2015-12-14 DIAGNOSIS — D649 Anemia, unspecified: Secondary | ICD-10-CM

## 2015-12-14 DIAGNOSIS — W19XXXA Unspecified fall, initial encounter: Secondary | ICD-10-CM | POA: Diagnosis present

## 2015-12-14 DIAGNOSIS — N179 Acute kidney failure, unspecified: Secondary | ICD-10-CM | POA: Diagnosis not present

## 2015-12-14 DIAGNOSIS — D638 Anemia in other chronic diseases classified elsewhere: Secondary | ICD-10-CM | POA: Diagnosis present

## 2015-12-14 LAB — CBC
HEMATOCRIT: 25.4 % — AB (ref 36.0–46.0)
Hemoglobin: 8.6 g/dL — ABNORMAL LOW (ref 12.0–15.0)
MCH: 31 pg (ref 26.0–34.0)
MCHC: 33.9 g/dL (ref 30.0–36.0)
MCV: 91.7 fL (ref 78.0–100.0)
Platelets: 293 10*3/uL (ref 150–400)
RBC: 2.77 MIL/uL — ABNORMAL LOW (ref 3.87–5.11)
RDW: 13.7 % (ref 11.5–15.5)
WBC: 5 10*3/uL (ref 4.0–10.5)

## 2015-12-14 LAB — BASIC METABOLIC PANEL
Anion gap: 6 (ref 5–15)
BUN: 29 mg/dL — AB (ref 6–20)
CHLORIDE: 105 mmol/L (ref 101–111)
CO2: 28 mmol/L (ref 22–32)
Calcium: 9.3 mg/dL (ref 8.9–10.3)
Creatinine, Ser: 2.11 mg/dL — ABNORMAL HIGH (ref 0.44–1.00)
GFR calc Af Amer: 21 mL/min — ABNORMAL LOW (ref >60)
GFR calc non Af Amer: 18 mL/min — ABNORMAL LOW (ref >60)
GLUCOSE: 81 mg/dL (ref 65–99)
POTASSIUM: 3.2 mmol/L — AB (ref 3.5–5.1)
SODIUM: 139 mmol/L (ref 135–145)

## 2015-12-14 MED ORDER — POTASSIUM CHLORIDE CRYS ER 20 MEQ PO TBCR
20.0000 meq | EXTENDED_RELEASE_TABLET | Freq: Two times a day (BID) | ORAL | Status: DC
  Administered 2015-12-14: 20 meq via ORAL
  Filled 2015-12-14: qty 1

## 2015-12-14 MED ORDER — PREDNISONE 10 MG PO TABS
10.0000 mg | ORAL_TABLET | Freq: Once | ORAL | Status: AC
  Administered 2015-12-14: 10 mg via ORAL
  Filled 2015-12-14: qty 1

## 2015-12-14 MED ORDER — CIPROFLOXACIN IN D5W 400 MG/200ML IV SOLN
400.0000 mg | INTRAVENOUS | Status: DC
  Administered 2015-12-14: 400 mg via INTRAVENOUS
  Filled 2015-12-14: qty 200

## 2015-12-14 MED ORDER — CIPROFLOXACIN HCL 250 MG PO TABS: 500.0000 mg | ORAL_TABLET | ORAL | Status: DC

## 2015-12-14 MED ORDER — POTASSIUM CHLORIDE IN NACL 20-0.9 MEQ/L-% IV SOLN
INTRAVENOUS | Status: DC
  Administered 2015-12-14: 12:00:00 via INTRAVENOUS

## 2015-12-14 NOTE — Progress Notes (Signed)
PROGRESS NOTE    [name] [name]  [id number] DOB: 1914/06/13 DOA: 12/13/2015 PCP: Wende Neighbors, MD    Brief Narrative:  [name] [name] is an 82 y.o. female who lives at home with her daughter, code status DNR, admitted in May 2017 for TIA, hx of AS, COPD, chronic anemia, and CKD, who fell at home. She presented to the ER with weakness. Evaluation in the ER included head CT, cervical CT, hip, right hand, CXR which all showed no acute processes.  Her Cr was elevated to 2.57, compared to her baseline Cr of 1.8. Lactic acid was normal. Later, her urinalysis was consistent with an infection. She was admitted for further evaluation and management.   Assessment & Plan:   Principal Problem:   Volume depletion Active Problems:   AKI (acute kidney injury) ([place])   Urinary tract infection   Weakness generalized   Hypothyroidism   Normocytic anemia   CKD stage 4 secondary to hypertension ([place])   Essential hypertension   DNR (do not resuscitate)   Hypokalemia   1. Acute kidney injury superimposed on stage III to stage IV chronic kidney disease, secondary to volume depletion. The patient's baseline creatinine is 1.8. It was 2.57 on admission. She was started on gentle IV fluids. Her creatinine has improved a little. Will continue gentle IV fluids with a slight increase in the rate.  Urinary tract infection. Urinalysis noted. The patient was started on Cipro earlier. Urine culture sent.  Hypokalemia. The patient's serum potassium was within normal limits on admission. With IV fluids, it has fallen to below normal limits. -We'll start potassium chloride supplementation cautiously in a patient with chronic kidney disease.  Essential hypertension. Patient is treated chronically with amlodipine, hydrochlorothiazide, and Toprol-XL. All were restarted except hydrochlorothiazide. Her blood pressure has been stable.  Normocytic anemia. The patient probably has anemia of chronic disease/chronic  kidney disease. Her hemoglobin was 9.9 on admission, but has fallen to 8.6, likely from dilutional effects of IV fluids. Patient denies black tarry stools or bright red blood per rectum. -We'll order, total iron, vitamin B12, and TSH. We'll continue PPI empirically as she is on Plavix chronically.  Hypothyroidism. Patient was restarted on Synthroid.  Generalized weakness. Etiology multifactorial including volume depletion, a cath, and urinalysis. PT consulted and evaluation noted and appreciated. The patient will be discharged with home health PT.   DVT prophylaxis: Subcutaneous heparin Code Status: DO NOT RESUSCITATE Family Communication: Discussed with daughter Edwena Felty Disposition Plan: Discharge to home when clinically appropriate, likely in the next 24 hours.   Consultants:   None  Procedures:   None  Antimicrobials:   Cipro 11/17   Subjective: Patient denies chest pain or shortness of breath. She is unsure about pain with urination but she doesn't think so.  Objective: Vitals:   12/13/15 1930 12/13/15 2014 12/14/15 0650 12/14/15 0703  BP: 179/86 (!) 180/70 (!) 185/82 (!) 144/65  Pulse: 78 78 67 75  Resp: 25 (!) 25 (!) 24 (!) 24  Temp:  98.1 F (36.7 C) 97.4 F (36.3 C) 98 F (36.7 C)  TempSrc:  Oral Oral Oral  SpO2: 97% 98% 98% 98%  Weight:  43.7 kg (96 lb 5.5 oz)    Height:  4\' 8"  (1.422 m)      Intake/Output Summary (Last 24 hours) at 12/14/15 1202 Last data filed at 12/14/15 0705  Gross per 24 hour  Intake           630.83 ml  Output  450 ml  Net           180.83 ml   Filed Weights   12/13/15 2014  Weight: 43.7 kg (96 lb 5.5 oz)    Examination:  General exam: Appears calm and comfortable  Respiratory system: Clear to auscultation. Respiratory effort normal. Cardiovascular system: S1, S2, with a 3/6 harsh systolic murmur. No pedal edema. Gastrointestinal system: Abdomen is nondistended, soft and nontender. No organomegaly or masses  felt. Normal bowel sounds heard. Central nervous system: Alert and oriented. No focal neurological deficits, except hard of hearing. Extremities: Symmetric 5 x 5 power. Skin: No rashes, lesions or ulcers Psychiatry: Judgement and insight appear normal. Mood & affect appropriate.     Data Reviewed: I have personally reviewed following labs and imaging studies  CBC:  Recent Labs Lab 12/13/15 1220 12/14/15 0628  WBC 8.7 5.0  HGB 9.9* 8.6*  HCT 30.8* 25.4*  MCV 91.9 91.7  PLT 349 0000000   Basic Metabolic Panel:  Recent Labs Lab 12/13/15 1220 12/14/15 0628  NA 141 139  K 3.5 3.2*  CL 105 105  CO2 26 28  GLUCOSE 98 81  BUN 34* 29*  CREATININE 2.57* 2.11*  CALCIUM 10.0 9.3   GFR: Estimated Creatinine Clearance: 9 mL/min (by C-G formula based on SCr of 2.11 mg/dL (H)). Liver Function Tests:  Recent Labs Lab 12/13/15 1220  AST 25  ALT 10*  ALKPHOS 69  BILITOT 0.5  PROT 7.0  ALBUMIN 3.4*    Recent Labs Lab 12/13/15 1220  LIPASE 33   No results for input(s): AMMONIA in the last 168 hours. Coagulation Profile: No results for input(s): INR, PROTIME in the last 168 hours. Cardiac Enzymes:  Recent Labs Lab 12/13/15 1339  TROPONINI 0.04*   BNP (last 3 results) No results for input(s): PROBNP in the last 8760 hours. HbA1C: No results for input(s): HGBA1C in the last 72 hours. CBG: No results for input(s): GLUCAP in the last 168 hours. Lipid Profile: No results for input(s): CHOL, HDL, LDLCALC, TRIG, CHOLHDL, LDLDIRECT in the last 72 hours. Thyroid Function Tests: No results for input(s): TSH, T4TOTAL, FREET4, T3FREE, THYROIDAB in the last 72 hours. Anemia Panel: No results for input(s): VITAMINB12, FOLATE, FERRITIN, TIBC, IRON, RETICCTPCT in the last 72 hours. Sepsis Labs:  Recent Labs Lab 12/13/15 1339  LATICACIDVEN 1.0    No results found for this or any previous visit (from the past 240 hour(s)).       Radiology Studies: Dg Chest 1  View  Result Date: 12/13/2015 CLINICAL DATA:  Right hand pain, medial right elbow pain, status post fall EXAM: CHEST 1 VIEW COMPARISON:  06/26/2015 FINDINGS: There is left apical calcified pleural plaque. There is no focal parenchymal opacity. There is no pleural effusion or pneumothorax. The heart and mediastinal contours are unremarkable. Chronic mid and lower thoracic vertebral body compression fractures with vertebral body augmentation at T12. IMPRESSION: No active disease. Electronically Signed   By: Kathreen Devoid   On: 12/13/2015 14:41   Dg Elbow Complete Right  Result Date: 12/13/2015 CLINICAL DATA:  Right elbow pain due to a fall today. Initial encounter. EXAM: RIGHT ELBOW - COMPLETE 3+ VIEW COMPARISON:  None. FINDINGS: Bones are osteopenic. No fractures identified. Degenerative disease about the elbow appears advanced at the radiocapitellar joint. There is chondrocalcinosis present. No joint effusion is seen. IMPRESSION: No acute abnormality. Osteoarthritis appearing worst at the radio capitellar joint. Osteopenia. Electronically Signed   By: Inge Rise M.D.   On:  12/13/2015 14:38   Ct Head Wo Contrast  Result Date: 12/13/2015 CLINICAL DATA:  Pain following fall 1 day prior. Nausea and vomiting EXAM: CT HEAD WITHOUT CONTRAST CT CERVICAL SPINE WITHOUT CONTRAST TECHNIQUE: Multidetector CT imaging of the head and cervical spine was performed following the standard protocol without intravenous contrast. Multiplanar CT image reconstructions of the cervical spine were also generated. COMPARISON:  CT cervical spine April 26, 2012; head CT Jun 26, 2015 FINDINGS: CT HEAD FINDINGS Brain: There is moderate diffuse atrophy. There is no intracranial mass, hemorrhage, extra-axial fluid collection, or midline shift. There is evidence of a prior small infarct in the periphery of the left cerebellar hemisphere, stable. There is evidence of a small prior infarct in the posterior inferior right cerebellum.  There is small vessel disease throughout the centra semiovale bilaterally, stable. There is no new gray-Hardebeck compartment lesion. No acute infarct is evident. Vascular: There is extensive calcification in the carotid siphon regions as well as in both proximal middle cerebral arteries. There is mild ectasia of the proximal left middle cerebral artery, stable. No hyperdense vessel is evident. Skull: Bones are osteoporotic.  The bony calvarium appears intact. Sinuses/Orbits: There is mucosal thickening with an air-fluid level in the left sphenoid sinus. There is mild mucosal thickening in each posterior maxillary antrum. No intraorbital lesions are evident. Note that patient has had previous cataract for section on the right. Other: Mastoid air cells are clear. CT CERVICAL SPINE FINDINGS Alignment: There is minimal retrolisthesis of C5 on C6. There is 2 mm of anterolisthesis of C7 on T1. There is 2 mm of anterolisthesis of T1 on T2. These changes are felt to be due to underlying spondylosis. Skull base and vertebrae: Skull base and craniocervical junction regions appear unremarkable. There is no appreciable fracture in the cervical region. However, there is anterior wedging of the T4 vertebral body which appears chronic. Bones are osteoporotic. There are no blastic or lytic bone lesions evident. Soft tissues and spinal canal: Prevertebral soft tissues and predental space regions are normal. There is no paraspinous lesion. There is no high-grade spinal stenosis. Disc levels: There is marked disc space narrowing at C4-5, C5-6, and C6-7. There is mild to moderate disc space narrowing at all other levels. There is facet hypertrophy at all levels. There is exit foraminal narrowing due to bony hypertrophy at C3-4 on the right, marked, at C4-5 on the right, at C5-6 bilaterally, more common at C6-7 bilaterally, marked. There is impression on the exiting nerve roots at these sites due to bony hypertrophy. There is no frank disc  extrusion. Upper chest: There is apical lung scarring bilaterally. There is parietal pleural thickening on the left suggesting previous asbestos exposure. Other: There are carotid artery calcifications bilaterally. IMPRESSION: CT head: Stable atrophy with prior small infarcts in the cerebellum bilaterally as well as periventricular small vessel disease in the supratentorial Mellen matter. No acute infarct evident. No hemorrhage, mass, or extra-axial fluid collection. Air-fluid level noted in left sphenoid sinus. Foci of arterial vascular calcification noted. Bones somewhat osteoporotic. CT cervical spine: No acute fracture. Probable old fracture of the T4 vertebral body. Mild spondylolisthesis at several levels in the cervical region is stable and felt to be due to underlying spondylosis. There is multilevel arthropathy. There are foci of vascular calcification as well as scarring in the apices. Parietal pleural thickening on the left raises question of prior asbestos exposure. Electronically Signed   By: Lowella Grip III M.D.   On: 12/13/2015 15:17  Ct Cervical Spine Wo Contrast  Result Date: 12/13/2015 CLINICAL DATA:  Pain following fall 1 day prior. Nausea and vomiting EXAM: CT HEAD WITHOUT CONTRAST CT CERVICAL SPINE WITHOUT CONTRAST TECHNIQUE: Multidetector CT imaging of the head and cervical spine was performed following the standard protocol without intravenous contrast. Multiplanar CT image reconstructions of the cervical spine were also generated. COMPARISON:  CT cervical spine April 26, 2012; head CT Jun 26, 2015 FINDINGS: CT HEAD FINDINGS Brain: There is moderate diffuse atrophy. There is no intracranial mass, hemorrhage, extra-axial fluid collection, or midline shift. There is evidence of a prior small infarct in the periphery of the left cerebellar hemisphere, stable. There is evidence of a small prior infarct in the posterior inferior right cerebellum. There is small vessel disease throughout  the centra semiovale bilaterally, stable. There is no new gray-Haynesworth compartment lesion. No acute infarct is evident. Vascular: There is extensive calcification in the carotid siphon regions as well as in both proximal middle cerebral arteries. There is mild ectasia of the proximal left middle cerebral artery, stable. No hyperdense vessel is evident. Skull: Bones are osteoporotic.  The bony calvarium appears intact. Sinuses/Orbits: There is mucosal thickening with an air-fluid level in the left sphenoid sinus. There is mild mucosal thickening in each posterior maxillary antrum. No intraorbital lesions are evident. Note that patient has had previous cataract for section on the right. Other: Mastoid air cells are clear. CT CERVICAL SPINE FINDINGS Alignment: There is minimal retrolisthesis of C5 on C6. There is 2 mm of anterolisthesis of C7 on T1. There is 2 mm of anterolisthesis of T1 on T2. These changes are felt to be due to underlying spondylosis. Skull base and vertebrae: Skull base and craniocervical junction regions appear unremarkable. There is no appreciable fracture in the cervical region. However, there is anterior wedging of the T4 vertebral body which appears chronic. Bones are osteoporotic. There are no blastic or lytic bone lesions evident. Soft tissues and spinal canal: Prevertebral soft tissues and predental space regions are normal. There is no paraspinous lesion. There is no high-grade spinal stenosis. Disc levels: There is marked disc space narrowing at C4-5, C5-6, and C6-7. There is mild to moderate disc space narrowing at all other levels. There is facet hypertrophy at all levels. There is exit foraminal narrowing due to bony hypertrophy at C3-4 on the right, marked, at C4-5 on the right, at C5-6 bilaterally, more common at C6-7 bilaterally, marked. There is impression on the exiting nerve roots at these sites due to bony hypertrophy. There is no frank disc extrusion. Upper chest: There is apical  lung scarring bilaterally. There is parietal pleural thickening on the left suggesting previous asbestos exposure. Other: There are carotid artery calcifications bilaterally. IMPRESSION: CT head: Stable atrophy with prior small infarcts in the cerebellum bilaterally as well as periventricular small vessel disease in the supratentorial Gopal matter. No acute infarct evident. No hemorrhage, mass, or extra-axial fluid collection. Air-fluid level noted in left sphenoid sinus. Foci of arterial vascular calcification noted. Bones somewhat osteoporotic. CT cervical spine: No acute fracture. Probable old fracture of the T4 vertebral body. Mild spondylolisthesis at several levels in the cervical region is stable and felt to be due to underlying spondylosis. There is multilevel arthropathy. There are foci of vascular calcification as well as scarring in the apices. Parietal pleural thickening on the left raises question of prior asbestos exposure. Electronically Signed   By: Lowella Grip III M.D.   On: 12/13/2015 15:17   Dg  Hand Complete Right  Result Date: 12/13/2015 CLINICAL DATA:  Status post fall.  Right hand and wrist pain. EXAM: RIGHT HAND - COMPLETE 3+ VIEW COMPARISON:  None. FINDINGS: Bones are osteopenic. No acute bony or joint abnormality is identified. Flexion deformities of all the fingers are noted in reportedly chronic. The patient has advanced first [facility] and scaphoid trapezium trapezoid joint osteoarthritis. Degenerative change about the DIP joints of the fingers is also identified. Chondrocalcinosis about the wrist is noted. IMPRESSION: No acute abnormality. Multifocal osteoarthritis. Osteopenia. Chondrocalcinosis. Electronically Signed   By: Inge Rise M.D.   On: 12/13/2015 14:36   Dg Hip Unilat With Pelvis 2-3 Views Right  Result Date: 12/13/2015 CLINICAL DATA:  Fall yesterday in the bathroom at home, right hip pain since that time. EXAM: DG HIP (WITH OR WITHOUT PELVIS) 2-3V RIGHT  COMPARISON:  None. FINDINGS: The cortical margins of the bony pelvis are intact. No fracture. Pubic symphysis and sacroiliac joints are congruent. Both femoral heads are well-seated in the respective acetabula. Chondrocalcinosis of the pubic symphysis. The bones appear under mineralized. IMPRESSION: No evidence of fracture of the pelvis or right hip. Electronically Signed   By: Jeb Levering M.D.   On: 12/13/2015 14:35        Scheduled Meds: . amLODipine  10 mg Oral Daily  . ciprofloxacin  400 mg Intravenous Q24H  . clopidogrel  75 mg Oral Daily  . docusate sodium  100 mg Oral BID  . heparin  5,000 Units Subcutaneous Q8H  . latanoprost  1 drop Both Eyes QHS  . levothyroxine  75 mcg Oral QAC breakfast  . metoprolol succinate  100 mg Oral QPM  . multivitamin with minerals  1 tablet Oral Daily  . pantoprazole  40 mg Oral Daily  . polyethylene glycol  17 g Oral QHS  . potassium chloride  20 mEq Oral BID  . sertraline  100 mg Oral Daily   Continuous Infusions: . dextrose 5 % and 0.9% NaCl 50 mL/hr at 12/13/15 2123     LOS: 0 days    Time spent: 47 minutes    Rexene Alberts, MD [facility] Pager [phone]  If 7PM-7AM, please contact night-coverage [email or web address] Password [email or web address] 12/14/2015, 12:02 PM

## 2015-12-14 NOTE — Progress Notes (Signed)
Pharmacy Antibiotic Note  [name] [name] is a 82 y.o. female admitted on 12/13/2015 with UTI.  Pharmacy has been consulted for cipro dosing.  Plan: Cipro 400mg  po Q24h F/U cultures and sensitives  Height: 4\' 8"  (142.2 cm) Weight: 96 lb 5.5 oz (43.7 kg) IBW/kg (Calculated) : 36.3  Temp (24hrs), Avg:98.1 F (36.7 C), Min:97.4 F (36.3 C), Max:98.7 F (37.1 C)   Recent Labs Lab 12/13/15 1220 12/13/15 1339 12/14/15 0628  WBC 8.7  --  5.0  CREATININE 2.57*  --  2.11*  LATICACIDVEN  --  1.0  --     Estimated Creatinine Clearance: 9 mL/min (by C-G formula based on SCr of 2.11 mg/dL (H)).    Allergies  Allergen Reactions  . Neosporin [Neomycin-Bacitracin Zn-Polymyx] Rash  . Nitrofurantoin Monohyd Macro Nausea And Vomiting and Other (See Comments)    Fever, chills  . Valtrex [Valacyclovir Hcl] Other (See Comments)    Confusion, leg weakness, hallucinations.  . Penicillins Rash    Has patient had a PCN reaction causing immediate rash, facial/tongue/throat swelling, SOB or lightheadedness with hypotension: Yes Has patient had a PCN reaction causing severe rash involving mucus membranes or skin necrosis: No Has patient had a PCN reaction that required hospitalization No Has patient had a PCN reaction occurring within the last 10 years: No If all of the above answers are "NO", then may proceed with Cephalosporin use.     Antimicrobials this admission: cipro 11/17 >>    Dose adjustments this admission: n/a  Microbiology results: 11/17 UCx: pending  Thank you for allowing pharmacy to be a part of this patient's care.  Isac Sarna, BS Pharm D, [place] Clinical Pharmacist Pager [phone] 12/14/2015 9:42 AM

## 2015-12-14 NOTE — Care Management Note (Signed)
Case Management Note  Patient Details  Name: [name] [name] MRN: [id number] Date of Birth: Dec 15, 1914  Subjective/Objective:    Adm from home with weakness. Lives with daughter. Has 2 walkers at home, one upstairs and one downstairs. Daughter pays privately for aide 9-1 MWF and sometimes on TTH if needed. She has PCP, transportation and insurance with drug coverage. Daughter believes that at some point patient will need SNF but that this is not an option during this visit as patient is in OBS and has Medicare as primary. Patient has had [facility] in the past and would like to have PT, SW, RN provided by [facility] this visit.              Action/Plan: Anticipate DC home in care of daughter with Home health RN, SW, and PT.  Expected Discharge Date:    12/14/2015             Expected Discharge Plan:  [place]  In-House Referral:  NA  Discharge planning Services  CM Consult  Post Acute Care Choice:  Home Health Choice offered to:  Adult Children, Patient  DME Arranged:    DME Agency:     HH Arranged:  RN, PT, Social Work [employer] Agency:  [place]  Status of Service:  In process, will continue to follow  If discussed at Long Length of Stay Meetings, dates discussed:    Additional Comments:  Khadim Lundberg, Chauncey Reading, RN 12/14/2015, 9:23 AM

## 2015-12-14 NOTE — Care Management Obs Status (Signed)
[place] NOTIFICATION   Patient Details  Name: [name] [name] MRN: [id number] Date of Birth: 04-10-1914   Medicare Observation Status Notification Given:  Yes    Monaca Wadas, Chauncey Reading, RN 12/14/2015, 9:31 AM

## 2015-12-14 NOTE — Progress Notes (Signed)
Pt. Daughter denied the pt getting any of her morning medications because she states she Medicare wont pay for anything. She agreed to the IV antibiotic once she found out she won't be leaving today. Will continue to monitor and educate on importance of medications.

## 2015-12-14 NOTE — Progress Notes (Signed)
Pt. Had a localized reaction when receiving IV Cipro. Localized redness and itching around the IV site. MD notified, suggested stopping IV Cipro and started prednisone. Nursing will continue to monitor.

## 2015-12-14 NOTE — Evaluation (Signed)
Physical Therapy Evaluation Patient Details Name: [name] [name] MRN: [id number] DOB: Feb 08, 1914 Today's Date: 12/14/2015   History of Present Illness   82 y.o. female lives at home with her daughter, code status DNR, admitted in May 2017 for TIA by me, hx of AS, COPD, chronic anemia, CKD, fell yesterday, and presented to the ER today with weakness.  Evaluation in the ER included head CT, cervical CT, hip, right hand, CXR showed no acute processes.  UA is pending.  Her Cr was elevated to 2.5, compared to her baseline Cr of 1.8, normal lactic acid.  Given her advanced age, and elevated Cr above her baseline, Dr Thurnell Garbe asked me to admit her OBS, to give gentle IVF, and for physical therapy evaluation.  Her daughter would have been OK with her going home tonight.   Clinical Impression  Pt received in bed, dtr present, and pt is agreeable to PT evaluation.  Pt lives with her dtr, and requires assistance for ambulation with a RW, and assist for ADL's.  Pt was able to perform bed mobility with increased time, and sit<>stand with Min/mod A.  She also ambulated 30ft with RW, but fatigued.  Pt was assisted onto [facility] and then assisted with hygiene afterwards.  At this point, pt is recommended to d/c home with HHPT and continued 24/7 supervision/assistance.      Follow Up Recommendations Home health PT;Supervision/Assistance - 24 hour    Equipment Recommendations  None recommended by PT    Recommendations for Other Services       Precautions / Restrictions Precautions Precautions: Fall Precaution Comments: Reason for admission.  Restrictions Weight Bearing Restrictions: No      Mobility  Bed Mobility Overal bed mobility: Needs Assistance Bed Mobility: Supine to Sit     Supine to sit: Supervision (increased time)        Transfers Overall transfer level: Needs assistance Equipment used: Rolling walker (2 wheeled) Transfers: Sit to/from Stand Sit to Stand: Min assist;Mod assist             Ambulation/Gait Ambulation/Gait assistance: Min assist Ambulation Distance (Feet): 20 Feet Assistive device: Rolling walker (2 wheeled) Gait Pattern/deviations: Shuffle;Trunk flexed   Gait velocity interpretation: <1.8 ft/sec, indicative of risk for recurrent falls General Gait Details: assist for RW navigation.   Stairs            Wheelchair Mobility    Modified Rankin (Stroke Patients Only)       Balance Overall balance assessment: Needs assistance;History of Falls Sitting-balance support: Bilateral upper extremity supported;Feet supported Sitting balance-Leahy Scale: Fair     Standing balance support: Bilateral upper extremity supported Standing balance-Leahy Scale: Poor                               Pertinent Vitals/Pain Pain Assessment: 0-10 Pain Score: 2  Pain Location: head and neck  Pain Descriptors / Indicators: Headache Pain Intervention(s): Limited activity within patient's tolerance;Monitored during session;Repositioned    Home Living   Living Arrangements: Children (Dtr) Available Help at Discharge: Available 24 hours/day;Personal care attendant (aide comes from 9-12 3 days per week. ) Type of Home: House Home Access: Stairs to enter   [employer] of Steps: 3 steps Home Layout: Two level Home Equipment: Walker - 2 wheels;Walker - 4 wheels;Shower seat;Bedside commode Additional Comments: Caregiver 3days/wk.  Dtr states that sometimes she will come more     Prior Function  Gait / Transfers Assistance Needed: Dtr states that since last admission, pt was receiving 24/7 supervision/assistance, and there is someone there all the time when pt is ambulating, and negotiating steps.  Pt also has assistance for all transfers including bed level, and sit<>stand transfers.   ADL's / Homemaking Assistance Needed: Dtr takes care of household responsibilities, Pt requires assistance for both dressing and bathing.   Comments:  Dtr states pt has periods of confusion where she believes she can move by herself, and tries to jump up.       Hand Dominance        Extremity/Trunk Assessment   Upper Extremity Assessment: Generalized weakness RUE Deficits / Details: finger contractures         Lower Extremity Assessment: Generalized weakness         Communication   Communication: HOH  Cognition Arousal/Alertness: Awake/alert Behavior During Therapy: WFL for tasks assessed/performed Overall Cognitive Status: Impaired/Different from baseline Area of Impairment: Orientation Orientation Level: Time (But pt is not normally able to tell the date. )                  General Comments      Exercises     Assessment/Plan    PT Assessment Patient needs continued PT services  PT Problem List Decreased strength;Decreased activity tolerance;Decreased balance;Decreased mobility;Decreased knowledge of use of DME;Decreased safety awareness;Decreased knowledge of precautions          PT Treatment Interventions DME instruction;Gait training;Functional mobility training;Stair training;Therapeutic activities;Therapeutic exercise;Balance training;Patient/family education    PT Goals (Current goals can be found in the Care Plan section)  Acute Rehab PT Goals Patient Stated Goal: To go home and walk PT Goal Formulation: With patient/family Time For Goal Achievement: 12/21/15 Potential to Achieve Goals: Fair    Frequency Min 3X/week   Barriers to discharge        Co-evaluation               End of Session Equipment Utilized During Treatment: Gait belt Activity Tolerance: Patient limited by fatigue Patient left: in chair;with call bell/phone within reach;with family/visitor present Nurse Communication: Mobility status Marye Round, RN notified of pt's location, and assist needed. )    Functional Assessment Tool Used: [employer] "6-clicks"  Functional Limitation: Mobility: Walking and  moving around Mobility: Walking and Moving Around Current Status [phone]): At least 20 percent but less than 40 percent impaired, limited or restricted Mobility: Walking and Moving Around Goal Status [phone]): At least 40 percent but less than 60 percent impaired, limited or restricted    Time: [id number] PT Time Calculation (min) (ACUTE ONLY): 38 min   Charges:   PT Evaluation $PT Eval Low Complexity: 1 Procedure PT Treatments $Gait Training: 8-22 mins $Therapeutic Activity: 8-22 mins   PT G Codes:   PT G-Codes **NOT FOR INPATIENT CLASS** Functional Assessment Tool Used: [employer] "6-clicks"  Functional Limitation: Mobility: Walking and moving around Mobility: Walking and Moving Around Current Status [phone]): At least 20 percent but less than 40 percent impaired, limited or restricted Mobility: Walking and Moving Around Goal Status [phone]): At least 40 percent but less than 60 percent impaired, limited or restricted    Beth Almin Livingstone, PT, DPT X: [phone]

## 2015-12-14 NOTE — Care Management Note (Signed)
Case Management Note  Patient Details  Name: [name] [name] MRN: [id number] Date of Birth: [birth year]  Expected Discharge Date:     12/15/2015             Expected Discharge Plan:  [place]  In-House Referral:  NA  Discharge planning Services  CM Consult  Post Acute Care Choice:  Home Health Choice offered to:  Adult Children, Patient  HH Arranged:  RN, PT, Social Work [employer] Agency:  [place]  Status of Service:  In process, will continue to follow  Additional Comments: [place] home over weekend. PT recommends HH PT. Pt/family agreeable. They understand [place] has 48hrs to make first visit. Romualdo Bolk, of [facility], is aware of referral and DC over weekend and will obtain pt info from chart. Weekend RN will fax orders and notify [place].   Sherald Barge, RN 12/14/2015, 1:35 PM

## 2015-12-15 ENCOUNTER — Inpatient Hospital Stay (HOSPITAL_COMMUNITY): Payer: Medicare Other

## 2015-12-15 DIAGNOSIS — R7989 Other specified abnormal findings of blood chemistry: Secondary | ICD-10-CM

## 2015-12-15 DIAGNOSIS — R748 Abnormal levels of other serum enzymes: Secondary | ICD-10-CM

## 2015-12-15 DIAGNOSIS — R778 Other specified abnormalities of plasma proteins: Secondary | ICD-10-CM

## 2015-12-15 DIAGNOSIS — J9601 Acute respiratory failure with hypoxia: Secondary | ICD-10-CM

## 2015-12-15 LAB — CBC
HCT: 24.6 % — ABNORMAL LOW (ref 36.0–46.0)
HEMOGLOBIN: 8.1 g/dL — AB (ref 12.0–15.0)
MCH: 30.2 pg (ref 26.0–34.0)
MCHC: 32.9 g/dL (ref 30.0–36.0)
MCV: 91.8 fL (ref 78.0–100.0)
PLATELETS: 268 10*3/uL (ref 150–400)
RBC: 2.68 MIL/uL — ABNORMAL LOW (ref 3.87–5.11)
RDW: 13.7 % (ref 11.5–15.5)
WBC: 4.7 10*3/uL (ref 4.0–10.5)

## 2015-12-15 LAB — BASIC METABOLIC PANEL
Anion gap: 5 (ref 5–15)
BUN: 29 mg/dL — ABNORMAL HIGH (ref 6–20)
CALCIUM: 8.8 mg/dL — AB (ref 8.9–10.3)
CHLORIDE: 108 mmol/L (ref 101–111)
CO2: 25 mmol/L (ref 22–32)
CREATININE: 2.38 mg/dL — AB (ref 0.44–1.00)
GFR, EST AFRICAN AMERICAN: 18 mL/min — AB (ref >60)
GFR, EST NON AFRICAN AMERICAN: 16 mL/min — AB (ref >60)
Glucose, Bld: 82 mg/dL (ref 65–99)
Potassium: 4.3 mmol/L (ref 3.5–5.1)
SODIUM: 138 mmol/L (ref 135–145)

## 2015-12-15 LAB — TSH: TSH: 3.348 u[IU]/mL (ref 0.350–4.500)

## 2015-12-15 LAB — IRON AND TIBC
IRON: 74 ug/dL (ref 28–170)
Saturation Ratios: 38 % — ABNORMAL HIGH (ref 10.4–31.8)
TIBC: 195 ug/dL — AB (ref 250–450)
UIBC: 121 ug/dL

## 2015-12-15 LAB — VITAMIN B12: Vitamin B-12: 416 pg/mL (ref 180–914)

## 2015-12-15 MED ORDER — POTASSIUM CHLORIDE IN NACL 20-0.9 MEQ/L-% IV SOLN
INTRAVENOUS | Status: DC
  Administered 2015-12-15: 11:00:00 via INTRAVENOUS

## 2015-12-15 MED ORDER — LEVOFLOXACIN 250 MG PO TABS: 250.0000 mg | ORAL_TABLET | ORAL | 0 refills | Status: DC

## 2015-12-15 MED ORDER — CIPROFLOXACIN HCL 250 MG PO TABS: 500.0000 mg | ORAL_TABLET | Freq: Two times a day (BID) | ORAL | Status: DC

## 2015-12-15 MED ORDER — LEVOFLOXACIN 250 MG PO TABS
250.0000 mg | ORAL_TABLET | ORAL | Status: DC
  Administered 2015-12-15: 250 mg via ORAL
  Filled 2015-12-15: qty 1

## 2015-12-15 MED ORDER — IPRATROPIUM-ALBUTEROL 0.5-2.5 (3) MG/3ML IN SOLN
3.0000 mL | Freq: Four times a day (QID) | RESPIRATORY_TRACT | Status: DC
Start: 2015-12-15 — End: 2015-12-15

## 2015-12-15 MED ORDER — IPRATROPIUM-ALBUTEROL 0.5-2.5 (3) MG/3ML IN SOLN: 3.0000 mL | Freq: Four times a day (QID) | RESPIRATORY_TRACT | Status: DC | PRN

## 2015-12-15 NOTE — Progress Notes (Signed)
SATURATION QUALIFICATIONS: (This note is used to comply with regulatory documentation for home oxygen)  Patient Saturations on Room Air at Rest = 97%  Patient Saturations on Room Air while Ambulating = 86%

## 2015-12-15 NOTE — Progress Notes (Signed)
Pt discharged into the care of her daughter and granddaughter via wheelchair into private vehicle.  Pt's daughter was very agitated and requested to take pt home w/o waiting for O2 to arrive.  Verbalized to daughter that pt's O2 sats were 97% at rest on RA and dropped to 86% w/ambulation.  Daughter accepted instructions and took pt home w/o O2.  [facility] is to deliver O2 to pt's home.  PIV removed intact w/o complications.  Discharge instructions reviewed with daughter.  Daughter verbalized understanding.

## 2015-12-15 NOTE — Progress Notes (Signed)
Updated DME home oxygen order and discharge summary faxed to [phone] with cover sheet addressed to [facility] with [place].

## 2015-12-15 NOTE — Discharge Summary (Signed)
Physician Discharge Summary  [name] [name] [id number] DOB: [birth year] DOA: 12/13/2015  PCP: Wende Neighbors, MD  Admit date: 12/13/2015 Discharge date: 12/15/2015  Time spent: Greater than 30 minutes  Recommendations for Outpatient Follow-up:  1. Patient was discharged on home oxygen, 2 L/m for an oxygen saturation that fell to 86% on room air with ambulation. 2. Recommend follow-up of the patient's renal function and hemoglobin/hematocrit. Also, vitamin B12 and total iron lab studies were pending at the time of discharge. 3. Home health physical therapy and a registered nurse were ordered at the time of discharge.     Discharge Diagnoses:  1. Urinary tract infection. 2. Acute kidney injury superimposed on stage IV chronic kidney disease, secondary to prerenal azotemia. 3. Acute respiratory failure with hypoxia, superimposed on chronic respiratory failure with hypoxia secondary to COPD. 4. COPD. 5. Hypokalemia. 6. Essential hypertension. 7. Chronic aortic stenosis. 8. Normocytic anemia. 9. Hypothyroidism. 10. DO NOT RESUSCITATE.   Discharge Condition: Stable  Diet recommendation: Heart healthy  Filed Weights   12/13/15 2014  Weight: 43.7 kg (96 lb 5.5 oz)    History of present illness:  [name] Reburn Whiteis an 82 y.o.female wholives at home with her daughter, code status DNR, admitted in May 2017 for TIA, hx of AS, COPD, chronic anemia, and CKD, who fell at home. She presented to the ER with weakness. Evaluation in the ER included head CT, cervical CT, hip, right hand, CXR which all showed no acute processes. Her Cr was elevated to 2.57, compared to her baseline Cr of 1.8-1.9. Lactic acid was normal. Later, her urinalysis was consistent with an infection. She was admitted for further evaluation and management.  Hospital Course:   1. Acute kidney injury superimposed on stage III- IV chronic kidney disease, secondary to volume depletion. The patient's baseline creatinine was  1.9. It was 2.57 on admission. She was started on gentle IV fluids. Her creatinine did improve, but not back to baseline. Due to her aortic stenosis, aggressive IV fluids were not ordered. -The patient had nonoliguric AKI, however, she also may have progression of her kidney disease to stage IV. -Conservative treatment is recommended given the patient's age. Her daughter was in agreement.  Urinary tract infection. Urinalysis noted. The patient was started on IV Cipro, but it caused some redness at the IV site. She had taken Cipro orally many times in the past, so this was not considered a true allergy, but possibly irritation from the antibiotic vehicle. Nevertheless, antibiotic therapy was transitioned to Levaquin with renal dosing. She was discharged on Levaquin every other day for 3 more doses. -Urine culture was pending at the time of discharge.  COPD with acute on chronic hypoxic respiratory failure. Patient was noted to have shortness of breath and therefore IV fluids were temporarily withheld. Her oxygen saturations were in the 90s on room air. Nevertheless, she was started on supplemental oxygen. Her chest x-ray revealed cardiomegaly, COPD, and left lower lobe atelectasis. There was no evidence of pulmonary edema. There was no wheezing on exam, so steroids were not given. -Her oxygen saturation was assessed on room air with ambulation and fell to 86%. Therefore, she was discharged on home oxygen. -There was a high suspicion that the patient had underlying chronic hypoxic respiratory failure and was exacerbated with ambulation.  Hypokalemia. The patient's serum potassium was within normal limits on admission. With IV fluids, it had fallen to below normal limits. She was started on potassium chloride supplementation with improvement in her serum  potassium to 4.3.  Essential hypertension. Patient is treated chronically with amlodipine, hydrochlorothiazide, and Toprol-XL. All were restarted  except hydrochlorothiazide. Her blood pressure started trending up. Hydrochlorothiazide was restarted at the time of discharge.  Normocytic anemia. The patient probably has anemia of chronic disease/chronic kidney disease. Her hemoglobin was 9.9 on admission, but had fallen to 8.1, likely from dilutional effects of IV fluids. Patient denied black tarry stools or bright red blood per rectum. TSH was ordered and was within normal limits. Also, vitamin B12 and total iron were ordered, but the results were pending at the time of discharge. -Patient was restarted on her PPI as she takes Plavix chronically. She was also restarted on her multivitamin with iron. -Would recommend conservative management due to her age.  Hypothyroidism. Patient was restarted on Synthroid. Her TSH was within normal limits.  Generalized weakness. Etiology multifactorial including volume depletion and UTI. PT was consulted and evaluation was noted and appreciated. The patient was discharged with home health PT.  Procedures:  None  Consultations:  None  Discharge Exam: Vitals:   12/15/15 0612 12/15/15 0911  BP: (!) 186/81 (!) 160/69  Pulse: (!) 58   Resp: 20   Temp: 97.7 F (36.5 C)     General: Frail 82 year old woman in no acute distress. Cardiovascular: S1, S2, with a 3/6 harsh systolic murmur. No pedal edema. Respiratory: Breathing nonlabored. Decreased breath sounds in the bases but clear anteriorly.  Discharge Instructions   Discharge Instructions    Diet - low sodium heart healthy    Complete by:  As directed    For home use only DME oxygen    Complete by:  As directed    Oxygen conserving device:  Yes   Oxygen delivery system:  Gas   Increase activity slowly    Complete by:  As directed      Current Discharge Medication List    START taking these medications   Details  levofloxacin (LEVAQUIN) 250 MG tablet Take 1 tablet (250 mg total) by mouth every other day. Qty: 3 tablet, Refills:  0      CONTINUE these medications which have NOT CHANGED   Details  acetaminophen (TYLENOL) 500 MG tablet Take 500 mg by mouth at bedtime as needed for mild pain or moderate pain.     amLODipine (NORVASC) 10 MG tablet TAKE ONE TABLET BY MOUTH ONCE DAILY. Qty: 90 tablet, Refills: 2    clopidogrel (PLAVIX) 75 MG tablet Take 1 tablet (75 mg total) by mouth daily. Qty: 30 tablet, Refills: 2    darbepoetin alfa-polysorbate (ARANESP, ALBUMIN FREE,) 100 MCG/ML injection Inject 1 mL (100 mcg total) into the skin once. Qty: 0.4 mL, Refills: 0    docusate sodium (COLACE) 100 MG capsule Take 100 mg by mouth 2 (two) times daily.      hydrochlorothiazide (HYDRODIURIL) 25 MG tablet Take 12.5 mg by mouth daily as needed (fluid).     latanoprost (XALATAN) 0.005 % ophthalmic solution Place 1 drop into both eyes at bedtime.     levothyroxine (SYNTHROID, LEVOTHROID) 75 MCG tablet Take 1 tablet by mouth daily.    metoprolol succinate (TOPROL-XL) 50 MG 24 hr tablet Take 1 tablet (50 mg total) by mouth daily. Take with or immediately following a meal. Qty: 90 tablet, Refills: 3    Multiple Vitamins-Minerals (MULTIVITAMIN WITH MINERALS) tablet Take 1 tablet by mouth daily.      nitroGLYCERIN (NITROSTAT) 0.4 MG SL tablet Place 1 tablet (0.4 mg total) under the tongue  every 5 (five) minutes as needed for chest pain. Qty: 25 tablet, Refills: 3    pantoprazole (PROTONIX) 40 MG tablet Take 40 mg by mouth daily.    polyethylene glycol (MIRALAX / GLYCOLAX) packet Take 17 g by mouth at bedtime.     sertraline (ZOLOFT) 100 MG tablet Take 100 mg by mouth daily.       Allergies  Allergen Reactions  . Neosporin [Neomycin-Bacitracin Zn-Polymyx] Rash  . Nitrofurantoin Monohyd Macro Nausea And Vomiting and Other (See Comments)    Fever, chills  . Valtrex [Valacyclovir Hcl] Other (See Comments)    Confusion, leg weakness, hallucinations.  . Penicillins Rash    Has patient had a PCN reaction causing  immediate rash, facial/tongue/throat swelling, SOB or lightheadedness with hypotension: Yes Has patient had a PCN reaction causing severe rash involving mucus membranes or skin necrosis: No Has patient had a PCN reaction that required hospitalization No Has patient had a PCN reaction occurring within the last 10 years: No If all of the above answers are "NO", then may proceed with Cephalosporin use.    Follow-up Information    [facility] Follow up.   Contact information: [place] [address] [phone]        Wende Neighbors, MD. Schedule an appointment as soon as possible for a visit in 1 week(s).   Specialty:  Internal Medicine Contact information: [place] [address] [phone]            The results of significant diagnostics from this hospitalization (including imaging, microbiology, ancillary and laboratory) are listed below for reference.    Significant Diagnostic Studies: Dg Chest 1 View  Result Date: 12/13/2015 CLINICAL DATA:  Right hand pain, medial right elbow pain, status post fall EXAM: CHEST 1 VIEW COMPARISON:  06/26/2015 FINDINGS: There is left apical calcified pleural plaque. There is no focal parenchymal opacity. There is no pleural effusion or pneumothorax. The heart and mediastinal contours are unremarkable. Chronic mid and lower thoracic vertebral body compression fractures with vertebral body augmentation at T12. IMPRESSION: No active disease. Electronically Signed   By: Kathreen Devoid   On: 12/13/2015 14:41   Dg Elbow Complete Right  Result Date: 12/13/2015 CLINICAL DATA:  Right elbow pain due to a fall today. Initial encounter. EXAM: RIGHT ELBOW - COMPLETE 3+ VIEW COMPARISON:  None. FINDINGS: Bones are osteopenic. No fractures identified. Degenerative disease about the elbow appears advanced at the radiocapitellar joint. There is chondrocalcinosis present. No joint effusion is seen. IMPRESSION:  No acute abnormality. Osteoarthritis appearing worst at the radio capitellar joint. Osteopenia. Electronically Signed   By: Inge Rise M.D.   On: 12/13/2015 14:38   Ct Head Wo Contrast  Result Date: 12/13/2015 CLINICAL DATA:  Pain following fall 1 day prior. Nausea and vomiting EXAM: CT HEAD WITHOUT CONTRAST CT CERVICAL SPINE WITHOUT CONTRAST TECHNIQUE: Multidetector CT imaging of the head and cervical spine was performed following the standard protocol without intravenous contrast. Multiplanar CT image reconstructions of the cervical spine were also generated. COMPARISON:  CT cervical spine April 26, 2012; head CT Jun 26, 2015 FINDINGS: CT HEAD FINDINGS Brain: There is moderate diffuse atrophy. There is no intracranial mass, hemorrhage, extra-axial fluid collection, or midline shift. There is evidence of a prior small infarct in the periphery of the left cerebellar hemisphere, stable. There is evidence of a small prior infarct in the posterior inferior right cerebellum. There is small vessel disease throughout the centra semiovale bilaterally,  stable. There is no new gray-Jungbluth compartment lesion. No acute infarct is evident. Vascular: There is extensive calcification in the carotid siphon regions as well as in both proximal middle cerebral arteries. There is mild ectasia of the proximal left middle cerebral artery, stable. No hyperdense vessel is evident. Skull: Bones are osteoporotic.  The bony calvarium appears intact. Sinuses/Orbits: There is mucosal thickening with an air-fluid level in the left sphenoid sinus. There is mild mucosal thickening in each posterior maxillary antrum. No intraorbital lesions are evident. Note that patient has had previous cataract for section on the right. Other: Mastoid air cells are clear. CT CERVICAL SPINE FINDINGS Alignment: There is minimal retrolisthesis of C5 on C6. There is 2 mm of anterolisthesis of C7 on T1. There is 2 mm of anterolisthesis of T1 on T2. These  changes are felt to be due to underlying spondylosis. Skull base and vertebrae: Skull base and craniocervical junction regions appear unremarkable. There is no appreciable fracture in the cervical region. However, there is anterior wedging of the T4 vertebral body which appears chronic. Bones are osteoporotic. There are no blastic or lytic bone lesions evident. Soft tissues and spinal canal: Prevertebral soft tissues and predental space regions are normal. There is no paraspinous lesion. There is no high-grade spinal stenosis. Disc levels: There is marked disc space narrowing at C4-5, C5-6, and C6-7. There is mild to moderate disc space narrowing at all other levels. There is facet hypertrophy at all levels. There is exit foraminal narrowing due to bony hypertrophy at C3-4 on the right, marked, at C4-5 on the right, at C5-6 bilaterally, more common at C6-7 bilaterally, marked. There is impression on the exiting nerve roots at these sites due to bony hypertrophy. There is no frank disc extrusion. Upper chest: There is apical lung scarring bilaterally. There is parietal pleural thickening on the left suggesting previous asbestos exposure. Other: There are carotid artery calcifications bilaterally. IMPRESSION: CT head: Stable atrophy with prior small infarcts in the cerebellum bilaterally as well as periventricular small vessel disease in the supratentorial Gatliff matter. No acute infarct evident. No hemorrhage, mass, or extra-axial fluid collection. Air-fluid level noted in left sphenoid sinus. Foci of arterial vascular calcification noted. Bones somewhat osteoporotic. CT cervical spine: No acute fracture. Probable old fracture of the T4 vertebral body. Mild spondylolisthesis at several levels in the cervical region is stable and felt to be due to underlying spondylosis. There is multilevel arthropathy. There are foci of vascular calcification as well as scarring in the apices. Parietal pleural thickening on the left  raises question of prior asbestos exposure. Electronically Signed   By: Lowella Grip III M.D.   On: 12/13/2015 15:17   Ct Cervical Spine Wo Contrast  Result Date: 12/13/2015 CLINICAL DATA:  Pain following fall 1 day prior. Nausea and vomiting EXAM: CT HEAD WITHOUT CONTRAST CT CERVICAL SPINE WITHOUT CONTRAST TECHNIQUE: Multidetector CT imaging of the head and cervical spine was performed following the standard protocol without intravenous contrast. Multiplanar CT image reconstructions of the cervical spine were also generated. COMPARISON:  CT cervical spine April 26, 2012; head CT Jun 26, 2015 FINDINGS: CT HEAD FINDINGS Brain: There is moderate diffuse atrophy. There is no intracranial mass, hemorrhage, extra-axial fluid collection, or midline shift. There is evidence of a prior small infarct in the periphery of the left cerebellar hemisphere, stable. There is evidence of a small prior infarct in the posterior inferior right cerebellum. There is small vessel disease throughout the centra semiovale bilaterally, stable.  There is no new gray-Woelfel compartment lesion. No acute infarct is evident. Vascular: There is extensive calcification in the carotid siphon regions as well as in both proximal middle cerebral arteries. There is mild ectasia of the proximal left middle cerebral artery, stable. No hyperdense vessel is evident. Skull: Bones are osteoporotic.  The bony calvarium appears intact. Sinuses/Orbits: There is mucosal thickening with an air-fluid level in the left sphenoid sinus. There is mild mucosal thickening in each posterior maxillary antrum. No intraorbital lesions are evident. Note that patient has had previous cataract for section on the right. Other: Mastoid air cells are clear. CT CERVICAL SPINE FINDINGS Alignment: There is minimal retrolisthesis of C5 on C6. There is 2 mm of anterolisthesis of C7 on T1. There is 2 mm of anterolisthesis of T1 on T2. These changes are felt to be due to  underlying spondylosis. Skull base and vertebrae: Skull base and craniocervical junction regions appear unremarkable. There is no appreciable fracture in the cervical region. However, there is anterior wedging of the T4 vertebral body which appears chronic. Bones are osteoporotic. There are no blastic or lytic bone lesions evident. Soft tissues and spinal canal: Prevertebral soft tissues and predental space regions are normal. There is no paraspinous lesion. There is no high-grade spinal stenosis. Disc levels: There is marked disc space narrowing at C4-5, C5-6, and C6-7. There is mild to moderate disc space narrowing at all other levels. There is facet hypertrophy at all levels. There is exit foraminal narrowing due to bony hypertrophy at C3-4 on the right, marked, at C4-5 on the right, at C5-6 bilaterally, more common at C6-7 bilaterally, marked. There is impression on the exiting nerve roots at these sites due to bony hypertrophy. There is no frank disc extrusion. Upper chest: There is apical lung scarring bilaterally. There is parietal pleural thickening on the left suggesting previous asbestos exposure. Other: There are carotid artery calcifications bilaterally. IMPRESSION: CT head: Stable atrophy with prior small infarcts in the cerebellum bilaterally as well as periventricular small vessel disease in the supratentorial Schild matter. No acute infarct evident. No hemorrhage, mass, or extra-axial fluid collection. Air-fluid level noted in left sphenoid sinus. Foci of arterial vascular calcification noted. Bones somewhat osteoporotic. CT cervical spine: No acute fracture. Probable old fracture of the T4 vertebral body. Mild spondylolisthesis at several levels in the cervical region is stable and felt to be due to underlying spondylosis. There is multilevel arthropathy. There are foci of vascular calcification as well as scarring in the apices. Parietal pleural thickening on the left raises question of prior  asbestos exposure. Electronically Signed   By: Lowella Grip III M.D.   On: 12/13/2015 15:17   Dg Chest Port 1 View  Result Date: 12/15/2015 CLINICAL DATA:  Shortness of Breath EXAM: PORTABLE CHEST 1 VIEW COMPARISON:  12/13/2015 FINDINGS: Cardiomegaly with vascular congestion. COPD. Left lower lobe atelectasis or infiltrate, new since prior study. No confluent opacity on the right. No definite effusion. IMPRESSION: Cardiomegaly. COPD. Left lower lobe atelectasis or infiltrate. Electronically Signed   By: Rolm Baptise M.D.   On: 12/15/2015 07:20   Dg Hand Complete Right  Result Date: 12/13/2015 CLINICAL DATA:  Status post fall.  Right hand and wrist pain. EXAM: RIGHT HAND - COMPLETE 3+ VIEW COMPARISON:  None. FINDINGS: Bones are osteopenic. No acute bony or joint abnormality is identified. Flexion deformities of all the fingers are noted in reportedly chronic. The patient has advanced first [facility] and scaphoid trapezium trapezoid joint osteoarthritis. Degenerative  change about the DIP joints of the fingers is also identified. Chondrocalcinosis about the wrist is noted. IMPRESSION: No acute abnormality. Multifocal osteoarthritis. Osteopenia. Chondrocalcinosis. Electronically Signed   By: Inge Rise M.D.   On: 12/13/2015 14:36   Dg Hip Unilat With Pelvis 2-3 Views Right  Result Date: 12/13/2015 CLINICAL DATA:  Fall yesterday in the bathroom at home, right hip pain since that time. EXAM: DG HIP (WITH OR WITHOUT PELVIS) 2-3V RIGHT COMPARISON:  None. FINDINGS: The cortical margins of the bony pelvis are intact. No fracture. Pubic symphysis and sacroiliac joints are congruent. Both femoral heads are well-seated in the respective acetabula. Chondrocalcinosis of the pubic symphysis. The bones appear under mineralized. IMPRESSION: No evidence of fracture of the pelvis or right hip. Electronically Signed   By: Jeb Levering M.D.   On: 12/13/2015 14:35    Microbiology: Recent Results (from the past  240 hour(s))  Urine culture     Status: None (Preliminary result)   Collection Time: 12/13/15  5:25 PM  Result Value Ref Range Status   Specimen Description URINE, CLEAN CATCH  Final   Special Requests NONE  Final   Culture   Final    CULTURE REINCUBATED FOR BETTER GROWTH Performed at [facility]    Report Status PENDING  Incomplete     Labs: Basic Metabolic Panel:  Recent Labs Lab 12/13/15 1220 12/14/15 0628 12/15/15 0640  NA 141 139 138  K 3.5 3.2* 4.3  CL 105 105 108  CO2 26 28 25   GLUCOSE 98 81 82  BUN 34* 29* 29*  CREATININE 2.57* 2.11* 2.38*  CALCIUM 10.0 9.3 8.8*   Liver Function Tests:  Recent Labs Lab 12/13/15 1220  AST 25  ALT 10*  ALKPHOS 69  BILITOT 0.5  PROT 7.0  ALBUMIN 3.4*    Recent Labs Lab 12/13/15 1220  LIPASE 33   No results for input(s): AMMONIA in the last 168 hours. CBC:  Recent Labs Lab 12/13/15 1220 12/14/15 0628 12/15/15 0640  WBC 8.7 5.0 4.7  HGB 9.9* 8.6* 8.1*  HCT 30.8* 25.4* 24.6*  MCV 91.9 91.7 91.8  PLT 349 293 268   Cardiac Enzymes:  Recent Labs Lab 12/13/15 1339  TROPONINI 0.04*   BNP: BNP (last 3 results) No results for input(s): BNP in the last 8760 hours.  ProBNP (last 3 results) No results for input(s): PROBNP in the last 8760 hours.  CBG: No results for input(s): GLUCAP in the last 168 hours.     Signed:  Jalyiah Shelley MD.  [facility] 12/15/2015, 2:54 PM

## 2015-12-15 NOTE — Progress Notes (Signed)
RN greeted pt this morning along with pt's family member. RN stated that "I will be back with your morning meds".  Family member stated " no you wont be back with her meds. They were supposed to leave you a note saying that I will be giving her her daily meds because we received a bill for $162 last time so I will give her her meds"  I stated that I can take her meds to the pharmacy and we can package them and dispense them but Family refused. Family stated that " she can have the IV antibiotics and other things but nothing else"  RN will continue to monitor patient and communicate with MD. Oswald Hillock, RN

## 2015-12-15 NOTE — Progress Notes (Signed)
Spoke to [place], with [facility] - she has confirmed that the order for home oxygen was received - states she will send an immediate referral to the approval department. Patient and family requesting oxygen for home. Pernice reports that patients oxygen will be delivered to her home tonight, likely within the next 2 hours. Updated primary RN, who will inform patient and family.

## 2015-12-15 NOTE — Progress Notes (Signed)
Spoke with Roselyn Reef, with [facility] who states oxygen order needs to reflect:   -Frequency/duration of oxygen use -ambulatory oxygen amount/route required for acceptable O2 sat -updated discharge summary that reflects chronic condition justifying need for oxygen  Paged Dr. Caryn Section, and she will complete.

## 2015-12-16 LAB — URINE CULTURE

## 2015-12-17 ENCOUNTER — Encounter (HOSPITAL_COMMUNITY)
Admission: RE | Admit: 2015-12-17 | Discharge: 2015-12-17 | Disposition: A | Discharge status: Home / Self Care | Payer: Medicare Other | Source: Ambulatory Visit | Attending: Nephrology | Admitting: Nephrology

## 2015-12-17 DIAGNOSIS — D631 Anemia in chronic kidney disease: Secondary | ICD-10-CM | POA: Insufficient documentation

## 2015-12-17 DIAGNOSIS — N183 Chronic kidney disease, stage 3 (moderate): Secondary | ICD-10-CM | POA: Diagnosis not present

## 2015-12-17 LAB — RENAL FUNCTION PANEL
ANION GAP: 7 (ref 5–15)
Albumin: 3.5 g/dL (ref 3.5–5.0)
BUN: 24 mg/dL — ABNORMAL HIGH (ref 6–20)
CALCIUM: 9.9 mg/dL (ref 8.9–10.3)
CO2: 27 mmol/L (ref 22–32)
Chloride: 106 mmol/L (ref 101–111)
Creatinine, Ser: 2.19 mg/dL — ABNORMAL HIGH (ref 0.44–1.00)
GFR calc non Af Amer: 17 mL/min — ABNORMAL LOW (ref >60)
GFR, EST AFRICAN AMERICAN: 20 mL/min — AB (ref >60)
Glucose, Bld: 93 mg/dL (ref 65–99)
Phosphorus: 3.1 mg/dL (ref 2.5–4.6)
Potassium: 4.7 mmol/L (ref 3.5–5.1)
SODIUM: 140 mmol/L (ref 135–145)

## 2015-12-17 MED ORDER — DARBEPOETIN ALFA 100 MCG/0.5ML IJ SOSY
PREFILLED_SYRINGE | INTRAMUSCULAR | Status: AC
  Filled 2015-12-17: qty 0.5

## 2015-12-17 MED ORDER — DARBEPOETIN ALFA 100 MCG/0.5ML IJ SOSY
100.0000 ug | PREFILLED_SYRINGE | Freq: Once | INTRAMUSCULAR | Status: AC
  Administered 2015-12-17: 100 ug via SUBCUTANEOUS

## 2015-12-17 NOTE — Progress Notes (Signed)
Pt unable to come into hospital for labs and injection. Went to car and obtained blood sample and gave injection per family request. Tolerated well.

## 2015-12-24 DIAGNOSIS — D649 Anemia, unspecified: Secondary | ICD-10-CM | POA: Diagnosis not present

## 2015-12-24 DIAGNOSIS — N184 Chronic kidney disease, stage 4 (severe): Secondary | ICD-10-CM | POA: Diagnosis not present

## 2015-12-24 DIAGNOSIS — J449 Chronic obstructive pulmonary disease, unspecified: Secondary | ICD-10-CM | POA: Diagnosis not present

## 2015-12-24 DIAGNOSIS — J9611 Chronic respiratory failure with hypoxia: Secondary | ICD-10-CM | POA: Diagnosis not present

## 2015-12-28 DIAGNOSIS — I129 Hypertensive chronic kidney disease with stage 1 through stage 4 chronic kidney disease, or unspecified chronic kidney disease: Secondary | ICD-10-CM | POA: Diagnosis not present

## 2015-12-28 DIAGNOSIS — J449 Chronic obstructive pulmonary disease, unspecified: Secondary | ICD-10-CM | POA: Diagnosis not present

## 2015-12-28 DIAGNOSIS — R1310 Dysphagia, unspecified: Secondary | ICD-10-CM | POA: Diagnosis not present

## 2015-12-28 DIAGNOSIS — N184 Chronic kidney disease, stage 4 (severe): Secondary | ICD-10-CM | POA: Diagnosis not present

## 2015-12-28 DIAGNOSIS — Z9181 History of falling: Secondary | ICD-10-CM | POA: Diagnosis not present

## 2015-12-28 DIAGNOSIS — D508 Other iron deficiency anemias: Secondary | ICD-10-CM | POA: Diagnosis not present

## 2015-12-28 DIAGNOSIS — I350 Nonrheumatic aortic (valve) stenosis: Secondary | ICD-10-CM | POA: Diagnosis not present

## 2015-12-28 DIAGNOSIS — H3530 Unspecified macular degeneration: Secondary | ICD-10-CM | POA: Diagnosis not present

## 2015-12-28 DIAGNOSIS — Z87440 Personal history of urinary (tract) infections: Secondary | ICD-10-CM | POA: Diagnosis not present

## 2015-12-30 DIAGNOSIS — R1310 Dysphagia, unspecified: Secondary | ICD-10-CM | POA: Diagnosis not present

## 2015-12-30 DIAGNOSIS — I129 Hypertensive chronic kidney disease with stage 1 through stage 4 chronic kidney disease, or unspecified chronic kidney disease: Secondary | ICD-10-CM | POA: Diagnosis not present

## 2015-12-30 DIAGNOSIS — Z87440 Personal history of urinary (tract) infections: Secondary | ICD-10-CM | POA: Diagnosis not present

## 2015-12-30 DIAGNOSIS — J449 Chronic obstructive pulmonary disease, unspecified: Secondary | ICD-10-CM | POA: Diagnosis not present

## 2015-12-30 DIAGNOSIS — D508 Other iron deficiency anemias: Secondary | ICD-10-CM | POA: Diagnosis not present

## 2015-12-30 DIAGNOSIS — N184 Chronic kidney disease, stage 4 (severe): Secondary | ICD-10-CM | POA: Diagnosis not present

## 2016-01-01 DIAGNOSIS — J449 Chronic obstructive pulmonary disease, unspecified: Secondary | ICD-10-CM | POA: Diagnosis not present

## 2016-01-01 DIAGNOSIS — R1310 Dysphagia, unspecified: Secondary | ICD-10-CM | POA: Diagnosis not present

## 2016-01-01 DIAGNOSIS — Z87440 Personal history of urinary (tract) infections: Secondary | ICD-10-CM | POA: Diagnosis not present

## 2016-01-01 DIAGNOSIS — I129 Hypertensive chronic kidney disease with stage 1 through stage 4 chronic kidney disease, or unspecified chronic kidney disease: Secondary | ICD-10-CM | POA: Diagnosis not present

## 2016-01-01 DIAGNOSIS — N184 Chronic kidney disease, stage 4 (severe): Secondary | ICD-10-CM | POA: Diagnosis not present

## 2016-01-01 DIAGNOSIS — D508 Other iron deficiency anemias: Secondary | ICD-10-CM | POA: Diagnosis not present

## 2016-01-03 ENCOUNTER — Other Ambulatory Visit (HOSPITAL_COMMUNITY)
Admission: AD | Admit: 2016-01-03 | Discharge: 2016-01-03 | Disposition: A | Discharge status: Home / Self Care | Payer: Medicare Other | Source: Skilled Nursing Facility | Attending: Internal Medicine | Admitting: Internal Medicine

## 2016-01-03 DIAGNOSIS — R1310 Dysphagia, unspecified: Secondary | ICD-10-CM | POA: Diagnosis not present

## 2016-01-03 DIAGNOSIS — N184 Chronic kidney disease, stage 4 (severe): Secondary | ICD-10-CM | POA: Diagnosis not present

## 2016-01-03 DIAGNOSIS — Z87440 Personal history of urinary (tract) infections: Secondary | ICD-10-CM | POA: Diagnosis not present

## 2016-01-03 DIAGNOSIS — I129 Hypertensive chronic kidney disease with stage 1 through stage 4 chronic kidney disease, or unspecified chronic kidney disease: Secondary | ICD-10-CM | POA: Diagnosis not present

## 2016-01-03 DIAGNOSIS — J449 Chronic obstructive pulmonary disease, unspecified: Secondary | ICD-10-CM | POA: Diagnosis not present

## 2016-01-03 DIAGNOSIS — N390 Urinary tract infection, site not specified: Secondary | ICD-10-CM | POA: Diagnosis not present

## 2016-01-03 DIAGNOSIS — Z5181 Encounter for therapeutic drug level monitoring: Secondary | ICD-10-CM | POA: Diagnosis not present

## 2016-01-03 DIAGNOSIS — D508 Other iron deficiency anemias: Secondary | ICD-10-CM | POA: Diagnosis not present

## 2016-01-03 LAB — URINALYSIS, ROUTINE W REFLEX MICROSCOPIC
BILIRUBIN URINE: NEGATIVE
Bacteria, UA: NONE SEEN
GLUCOSE, UA: NEGATIVE mg/dL
HGB URINE DIPSTICK: NEGATIVE
KETONES UR: NEGATIVE mg/dL
NITRITE: NEGATIVE
PH: 5 (ref 5.0–8.0)
Protein, ur: 100 mg/dL — AB
SPECIFIC GRAVITY, URINE: 1.014 (ref 1.005–1.030)

## 2016-01-06 LAB — URINE CULTURE

## 2016-01-07 DIAGNOSIS — J449 Chronic obstructive pulmonary disease, unspecified: Secondary | ICD-10-CM | POA: Diagnosis not present

## 2016-01-07 DIAGNOSIS — I129 Hypertensive chronic kidney disease with stage 1 through stage 4 chronic kidney disease, or unspecified chronic kidney disease: Secondary | ICD-10-CM | POA: Diagnosis not present

## 2016-01-07 DIAGNOSIS — Z87440 Personal history of urinary (tract) infections: Secondary | ICD-10-CM | POA: Diagnosis not present

## 2016-01-07 DIAGNOSIS — R1310 Dysphagia, unspecified: Secondary | ICD-10-CM | POA: Diagnosis not present

## 2016-01-07 DIAGNOSIS — D508 Other iron deficiency anemias: Secondary | ICD-10-CM | POA: Diagnosis not present

## 2016-01-07 DIAGNOSIS — N184 Chronic kidney disease, stage 4 (severe): Secondary | ICD-10-CM | POA: Diagnosis not present

## 2016-01-10 DIAGNOSIS — R1310 Dysphagia, unspecified: Secondary | ICD-10-CM | POA: Diagnosis not present

## 2016-01-10 DIAGNOSIS — N184 Chronic kidney disease, stage 4 (severe): Secondary | ICD-10-CM | POA: Diagnosis not present

## 2016-01-10 DIAGNOSIS — D508 Other iron deficiency anemias: Secondary | ICD-10-CM | POA: Diagnosis not present

## 2016-01-10 DIAGNOSIS — J449 Chronic obstructive pulmonary disease, unspecified: Secondary | ICD-10-CM | POA: Diagnosis not present

## 2016-01-10 DIAGNOSIS — Z87440 Personal history of urinary (tract) infections: Secondary | ICD-10-CM | POA: Diagnosis not present

## 2016-01-10 DIAGNOSIS — I129 Hypertensive chronic kidney disease with stage 1 through stage 4 chronic kidney disease, or unspecified chronic kidney disease: Secondary | ICD-10-CM | POA: Diagnosis not present

## 2016-01-14 DIAGNOSIS — J449 Chronic obstructive pulmonary disease, unspecified: Secondary | ICD-10-CM | POA: Diagnosis not present

## 2016-01-14 DIAGNOSIS — R1310 Dysphagia, unspecified: Secondary | ICD-10-CM | POA: Diagnosis not present

## 2016-01-14 DIAGNOSIS — Z87440 Personal history of urinary (tract) infections: Secondary | ICD-10-CM | POA: Diagnosis not present

## 2016-01-14 DIAGNOSIS — I129 Hypertensive chronic kidney disease with stage 1 through stage 4 chronic kidney disease, or unspecified chronic kidney disease: Secondary | ICD-10-CM | POA: Diagnosis not present

## 2016-01-14 DIAGNOSIS — N184 Chronic kidney disease, stage 4 (severe): Secondary | ICD-10-CM | POA: Diagnosis not present

## 2016-01-14 DIAGNOSIS — D508 Other iron deficiency anemias: Secondary | ICD-10-CM | POA: Diagnosis not present

## 2016-01-15 DIAGNOSIS — R1310 Dysphagia, unspecified: Secondary | ICD-10-CM | POA: Diagnosis not present

## 2016-01-15 DIAGNOSIS — Z87440 Personal history of urinary (tract) infections: Secondary | ICD-10-CM | POA: Diagnosis not present

## 2016-01-15 DIAGNOSIS — D508 Other iron deficiency anemias: Secondary | ICD-10-CM | POA: Diagnosis not present

## 2016-01-15 DIAGNOSIS — N184 Chronic kidney disease, stage 4 (severe): Secondary | ICD-10-CM | POA: Diagnosis not present

## 2016-01-15 DIAGNOSIS — I129 Hypertensive chronic kidney disease with stage 1 through stage 4 chronic kidney disease, or unspecified chronic kidney disease: Secondary | ICD-10-CM | POA: Diagnosis not present

## 2016-01-15 DIAGNOSIS — J449 Chronic obstructive pulmonary disease, unspecified: Secondary | ICD-10-CM | POA: Diagnosis not present

## 2016-01-16 ENCOUNTER — Other Ambulatory Visit (HOSPITAL_COMMUNITY)
Admission: AD | Admit: 2016-01-16 | Discharge: 2016-01-16 | Disposition: A | Discharge status: Home / Self Care | Payer: Medicare Other | Source: Skilled Nursing Facility | Attending: Nephrology | Admitting: Nephrology

## 2016-01-16 DIAGNOSIS — N184 Chronic kidney disease, stage 4 (severe): Secondary | ICD-10-CM | POA: Insufficient documentation

## 2016-01-16 DIAGNOSIS — J449 Chronic obstructive pulmonary disease, unspecified: Secondary | ICD-10-CM | POA: Diagnosis not present

## 2016-01-16 DIAGNOSIS — R1310 Dysphagia, unspecified: Secondary | ICD-10-CM | POA: Diagnosis not present

## 2016-01-16 DIAGNOSIS — Z87440 Personal history of urinary (tract) infections: Secondary | ICD-10-CM | POA: Diagnosis not present

## 2016-01-16 DIAGNOSIS — D508 Other iron deficiency anemias: Secondary | ICD-10-CM | POA: Insufficient documentation

## 2016-01-16 DIAGNOSIS — I129 Hypertensive chronic kidney disease with stage 1 through stage 4 chronic kidney disease, or unspecified chronic kidney disease: Secondary | ICD-10-CM | POA: Diagnosis not present

## 2016-01-16 LAB — RENAL FUNCTION PANEL
ALBUMIN: 3 g/dL — AB (ref 3.5–5.0)
ANION GAP: 8 (ref 5–15)
BUN: 25 mg/dL — ABNORMAL HIGH (ref 6–20)
CO2: 29 mmol/L (ref 22–32)
Calcium: 9.7 mg/dL (ref 8.9–10.3)
Chloride: 103 mmol/L (ref 101–111)
Creatinine, Ser: 1.98 mg/dL — ABNORMAL HIGH (ref 0.44–1.00)
GFR, EST AFRICAN AMERICAN: 23 mL/min — AB (ref >60)
GFR, EST NON AFRICAN AMERICAN: 20 mL/min — AB (ref >60)
Glucose, Bld: 120 mg/dL — ABNORMAL HIGH (ref 65–99)
PHOSPHORUS: 3.6 mg/dL (ref 2.5–4.6)
POTASSIUM: 3.8 mmol/L (ref 3.5–5.1)
Sodium: 140 mmol/L (ref 135–145)

## 2016-01-16 LAB — HEMOGLOBIN AND HEMATOCRIT, BLOOD
HEMATOCRIT: 29.7 % — AB (ref 36.0–46.0)
HEMOGLOBIN: 9.4 g/dL — AB (ref 12.0–15.0)

## 2016-01-17 DIAGNOSIS — Z87440 Personal history of urinary (tract) infections: Secondary | ICD-10-CM | POA: Diagnosis not present

## 2016-01-17 DIAGNOSIS — D508 Other iron deficiency anemias: Secondary | ICD-10-CM | POA: Diagnosis not present

## 2016-01-17 DIAGNOSIS — I129 Hypertensive chronic kidney disease with stage 1 through stage 4 chronic kidney disease, or unspecified chronic kidney disease: Secondary | ICD-10-CM | POA: Diagnosis not present

## 2016-01-17 DIAGNOSIS — R1310 Dysphagia, unspecified: Secondary | ICD-10-CM | POA: Diagnosis not present

## 2016-01-17 DIAGNOSIS — N184 Chronic kidney disease, stage 4 (severe): Secondary | ICD-10-CM | POA: Diagnosis not present

## 2016-01-17 DIAGNOSIS — J449 Chronic obstructive pulmonary disease, unspecified: Secondary | ICD-10-CM | POA: Diagnosis not present

## 2016-01-18 ENCOUNTER — Emergency Department (HOSPITAL_COMMUNITY): Payer: Medicare Other

## 2016-01-18 ENCOUNTER — Emergency Department (HOSPITAL_COMMUNITY)
Admission: EM | Admit: 2016-01-18 | Discharge: 2016-01-18 | Disposition: A | Discharge status: Home / Self Care | Payer: Medicare Other | Attending: Emergency Medicine | Admitting: Emergency Medicine

## 2016-01-18 ENCOUNTER — Encounter (HOSPITAL_COMMUNITY): Payer: Self-pay | Admitting: Emergency Medicine

## 2016-01-18 DIAGNOSIS — E039 Hypothyroidism, unspecified: Secondary | ICD-10-CM | POA: Insufficient documentation

## 2016-01-18 DIAGNOSIS — R0600 Dyspnea, unspecified: Secondary | ICD-10-CM | POA: Insufficient documentation

## 2016-01-18 DIAGNOSIS — R279 Unspecified lack of coordination: Secondary | ICD-10-CM | POA: Diagnosis not present

## 2016-01-18 DIAGNOSIS — N184 Chronic kidney disease, stage 4 (severe): Secondary | ICD-10-CM | POA: Insufficient documentation

## 2016-01-18 DIAGNOSIS — R109 Unspecified abdominal pain: Secondary | ICD-10-CM | POA: Diagnosis not present

## 2016-01-18 DIAGNOSIS — R062 Wheezing: Secondary | ICD-10-CM | POA: Diagnosis not present

## 2016-01-18 DIAGNOSIS — Z79899 Other long term (current) drug therapy: Secondary | ICD-10-CM | POA: Insufficient documentation

## 2016-01-18 DIAGNOSIS — R404 Transient alteration of awareness: Secondary | ICD-10-CM | POA: Diagnosis not present

## 2016-01-18 DIAGNOSIS — R1013 Epigastric pain: Secondary | ICD-10-CM | POA: Insufficient documentation

## 2016-01-18 DIAGNOSIS — Z7401 Bed confinement status: Secondary | ICD-10-CM | POA: Diagnosis not present

## 2016-01-18 DIAGNOSIS — J449 Chronic obstructive pulmonary disease, unspecified: Secondary | ICD-10-CM | POA: Diagnosis not present

## 2016-01-18 DIAGNOSIS — R0602 Shortness of breath: Secondary | ICD-10-CM | POA: Diagnosis not present

## 2016-01-18 DIAGNOSIS — I129 Hypertensive chronic kidney disease with stage 1 through stage 4 chronic kidney disease, or unspecified chronic kidney disease: Secondary | ICD-10-CM | POA: Diagnosis not present

## 2016-01-18 DIAGNOSIS — I10 Essential (primary) hypertension: Secondary | ICD-10-CM | POA: Diagnosis not present

## 2016-01-18 LAB — CBC WITH DIFFERENTIAL/PLATELET
BASOS ABS: 0 10*3/uL (ref 0.0–0.1)
Basophils Relative: 1 %
EOS ABS: 0.3 10*3/uL (ref 0.0–0.7)
EOS PCT: 5 %
HCT: 28.7 % — ABNORMAL LOW (ref 36.0–46.0)
Hemoglobin: 9.3 g/dL — ABNORMAL LOW (ref 12.0–15.0)
Lymphocytes Relative: 18 %
Lymphs Abs: 1.2 10*3/uL (ref 0.7–4.0)
MCH: 30.3 pg (ref 26.0–34.0)
MCHC: 32.4 g/dL (ref 30.0–36.0)
MCV: 93.5 fL (ref 78.0–100.0)
Monocytes Absolute: 0.9 10*3/uL (ref 0.1–1.0)
Monocytes Relative: 14 %
Neutro Abs: 4.2 10*3/uL (ref 1.7–7.7)
Neutrophils Relative %: 62 %
PLATELETS: 336 10*3/uL (ref 150–400)
RBC: 3.07 MIL/uL — AB (ref 3.87–5.11)
RDW: 14.3 % (ref 11.5–15.5)
WBC: 6.6 10*3/uL (ref 4.0–10.5)

## 2016-01-18 LAB — BASIC METABOLIC PANEL
Anion gap: 8 (ref 5–15)
BUN: 25 mg/dL — AB (ref 6–20)
CALCIUM: 9.5 mg/dL (ref 8.9–10.3)
CO2: 28 mmol/L (ref 22–32)
CREATININE: 2.02 mg/dL — AB (ref 0.44–1.00)
Chloride: 104 mmol/L (ref 101–111)
GFR calc Af Amer: 22 mL/min — ABNORMAL LOW (ref >60)
GFR, EST NON AFRICAN AMERICAN: 19 mL/min — AB (ref >60)
Glucose, Bld: 99 mg/dL (ref 65–99)
Potassium: 3.6 mmol/L (ref 3.5–5.1)
SODIUM: 140 mmol/L (ref 135–145)

## 2016-01-18 LAB — HEPATIC FUNCTION PANEL
ALBUMIN: 3 g/dL — AB (ref 3.5–5.0)
ALT: 8 U/L — ABNORMAL LOW (ref 14–54)
AST: 19 U/L (ref 15–41)
Alkaline Phosphatase: 64 U/L (ref 38–126)
Total Bilirubin: 0.5 mg/dL (ref 0.3–1.2)
Total Protein: 7.1 g/dL (ref 6.5–8.1)

## 2016-01-18 LAB — BRAIN NATRIURETIC PEPTIDE: B NATRIURETIC PEPTIDE 5: 536 pg/mL — AB (ref 0.0–100.0)

## 2016-01-18 LAB — LIPASE, BLOOD: LIPASE: 34 U/L (ref 11–51)

## 2016-01-18 LAB — TROPONIN I: Troponin I: 0.05 ng/mL (ref <0.03)

## 2016-01-18 MED ORDER — SODIUM CHLORIDE 0.9 % IV BOLUS (SEPSIS)
250.0000 mL | Freq: Once | INTRAVENOUS | Status: AC
  Administered 2016-01-18: 250 mL via INTRAVENOUS

## 2016-01-18 MED ORDER — ALBUTEROL SULFATE (2.5 MG/3ML) 0.083% IN NEBU
5.0000 mg | INHALATION_SOLUTION | Freq: Once | RESPIRATORY_TRACT | Status: AC
  Administered 2016-01-18: 5 mg via RESPIRATORY_TRACT
  Filled 2016-01-18: qty 6

## 2016-01-18 NOTE — ED Provider Notes (Signed)
[facility] DEPT Provider Note   CSN: [id number] Arrival date & time: 01/18/16  [id number]     History   Chief Complaint Chief Complaint  Patient presents with  . Shortness of Breath   Level V caveat due to dyspnea and difficulty hearing. HPI [name] [name] is a 82 y.o. female.  HPI Patient has reportedly been having shortness of breath. Patient states began around 2 in the morning. Had breathing treatment by EMS. Has had a little bit of cough. Somewhat difficult to get history due to some of her difficulty hearing. Is reportedly currently being treated for urinary tract infection.  Family members are now here. States that they brought her because she's been having pain in her abdomen. States that having difficulty eating. States it and told it is from the shingles she had months ago for that it was a urinary tract infection. She states that they are worried because she's had a bowel obstruction in the past.   Past Medical History:  Diagnosis Date  . Anemia, unspecified   . Aortic stenosis   . Backache, unspecified   . COPD (chronic obstructive pulmonary disease) ([place])   . Dizziness and giddiness   . Osteoporosis   . Stroke ([place])    [place]  . Thyrotoxicosis without mention of goiter or other cause, without mention of thyrotoxic crisis or storm   . TIA (transient ischemic attack)   . Unspecified disorder resulting from impaired renal function   . Unspecified essential hypertension     Patient Active Problem List   Diagnosis Date Noted  . Acute respiratory failure with hypoxia ([place]) 12/15/2015  . Elevated troponin   . Urinary tract infection 12/14/2015  . Hypokalemia 12/14/2015  . Urinary tract infection without hematuria 12/14/2015  . Weakness generalized 12/13/2015  . AKI (acute kidney injury) ([place]) 12/13/2015  . TIA (transient ischemic attack) 06/16/2015  . Brain TIA 06/16/2015  . Acute encephalopathy 06/06/2015  . Herpes zoster 06/06/2015  . CKD stage 4 secondary to  hypertension ([place]) 06/06/2015  . Volume depletion 06/06/2015  . Essential hypertension 06/06/2015  . Blindness due to macular degeneration 06/06/2015  . DNR (do not resuscitate) 06/06/2015  . Pyuria 06/06/2015  . CAROTID ARTERY STENOSIS, WITHOUT INFARCTION 12/22/2008  . Hypothyroidism 12/19/2008  . Normocytic anemia 12/19/2008  . HYPERTENSION 12/19/2008  . Aortic valve disorder 12/19/2008  . RENAL INSUFFICIENCY 12/19/2008  . BACK PAIN 12/19/2008  . DIZZINESS 12/19/2008    Past Surgical History:  Procedure Laterality Date  . BACK SURGERY     Kyphoplasty  . SALIVARY GLAND SURGERY Left   . small bowel obst      OB History    No data available       Home Medications    Prior to Admission medications   Medication Sig Start Date End Date Taking? Authorizing Provider  acetaminophen (TYLENOL) 500 MG tablet Take 500 mg by mouth at bedtime as needed for mild pain or moderate pain.    Yes Historical Provider, MD  amLODipine (NORVASC) 10 MG tablet TAKE ONE TABLET BY MOUTH ONCE DAILY. 04/30/15  Yes Arnoldo Lenis, MD  ceFEPIme (MAXIPIME) 2 g injection Inject 2 g into the muscle 2 (two) times daily.  01/14/16  Yes Historical Provider, MD  clopidogrel (PLAVIX) 75 MG tablet Take 1 tablet (75 mg total) by mouth daily. 06/18/15  Yes Estela Leonie Green, MD  darbepoetin alfa-polysorbate (ARANESP, ALBUMIN FREE,) 100 MCG/ML injection Inject 1 mL (100 mcg total) into the skin once.  08/14/10  Yes Fran Lowes, MD  docusate sodium (COLACE) 100 MG capsule Take 100 mg by mouth 2 (two) times daily.     Yes Historical Provider, MD  latanoprost (XALATAN) 0.005 % ophthalmic solution Place 1 drop into both eyes at bedtime.  07/28/11  Yes Historical Provider, MD  levothyroxine (SYNTHROID, LEVOTHROID) 75 MCG tablet Take 1 tablet by mouth daily. 04/28/15  Yes Historical Provider, MD  metoprolol succinate (TOPROL-XL) 50 MG 24 hr tablet Take 1 tablet (50 mg total) by mouth daily. Take with or immediately  following a meal. Patient taking differently: Take 100 mg by mouth every evening. Take with or immediately following a meal. 06/07/14  Yes Arnoldo Lenis, MD  Multiple Vitamins-Minerals (MULTIVITAMIN WITH MINERALS) tablet Take 1 tablet by mouth daily.     Yes Historical Provider, MD  nitroGLYCERIN (NITROSTAT) 0.4 MG SL tablet Place 1 tablet (0.4 mg total) under the tongue every 5 (five) minutes as needed for chest pain. 06/07/14  Yes Arnoldo Lenis, MD  pantoprazole (PROTONIX) 40 MG tablet Take 40 mg by mouth daily.   Yes Historical Provider, MD  polyethylene glycol (MIRALAX / GLYCOLAX) packet Take 17 g by mouth at bedtime.    Yes Historical Provider, MD  sertraline (ZOLOFT) 100 MG tablet Take 100 mg by mouth daily.   Yes Historical Provider, MD    Family History History reviewed. No pertinent family history.  Social History Social History  Substance Use Topics  . Smoking status: Never Smoker  . Smokeless tobacco: Never Used  . Alcohol use No     Allergies   Neosporin [neomycin-bacitracin zn-polymyx]; Nitrofurantoin monohyd macro; Valtrex [valacyclovir hcl]; and Penicillins   Review of Systems Review of Systems  Unable to perform ROS: Acuity of condition  Respiratory: Positive for cough.   Gastrointestinal: Positive for abdominal pain.     Physical Exam Updated Vital Signs BP 166/73 (BP Location: Left Arm)   Pulse 78   Temp 97.6 F (36.4 C) (Oral)   Resp 18   Wt 96 lb (43.5 kg)   SpO2 98%   BMI 21.52 kg/m   Physical Exam  Constitutional: She appears well-developed.  HENT:  Head: Atraumatic.  Neck: Neck supple. No JVD present.  Cardiovascular: Normal rate.   Pulmonary/Chest:  Mild diffuse wheezes and prolonged expirations.  Abdominal: There is no tenderness.  Musculoskeletal: She exhibits no edema.  Neurological:  Patient is awake but somewhat difficult to get history to due to apparent difficulty hearing.  Skin: Skin is warm. Capillary refill takes less  than 2 seconds.     ED Treatments / Results  Labs (all labs ordered are listed, but only abnormal results are displayed) Labs Reviewed  TROPONIN I - Abnormal; Notable for the following:       Result Value   Troponin I 0.05 (*)    All other components within normal limits  BRAIN NATRIURETIC PEPTIDE - Abnormal; Notable for the following:    B Natriuretic Peptide 536.0 (*)    All other components within normal limits  BASIC METABOLIC PANEL - Abnormal; Notable for the following:    BUN 25 (*)    Creatinine, Ser 2.02 (*)    GFR calc non Af Amer 19 (*)    GFR calc Af Amer 22 (*)    All other components within normal limits  CBC WITH DIFFERENTIAL/PLATELET - Abnormal; Notable for the following:    RBC 3.07 (*)    Hemoglobin 9.3 (*)    HCT 28.7 (*)  All other components within normal limits  HEPATIC FUNCTION PANEL - Abnormal; Notable for the following:    Albumin 3.0 (*)    ALT 8 (*)    Bilirubin, Direct <0.1 (*)    All other components within normal limits  LIPASE, BLOOD  URINALYSIS, ROUTINE W REFLEX MICROSCOPIC    EKG  EKG Interpretation  Date/Time:  Friday January 18 2016 07:26:04 EST Ventricular Rate:  75 PR Interval:    QRS Duration: 87 QT Interval:  378 QTC Calculation: 423 R Axis:   -21 Text Interpretation:  Sinus rhythm Short PR interval LVH with secondary repolarization abnormality Anterior Q waves, possibly due to LVH Confirmed by Alvino Chapel  MD, Samarion Ehle [phone]) on 01/18/2016 7:29:05 AM       Radiology Dg Chest 2 View  Result Date: 01/18/2016 CLINICAL DATA:  Shortness of breath. EXAM: CHEST  2 VIEW COMPARISON:  12/15/2015. FINDINGS: The heart is enlarged. Low lung volumes. There is mild vascular congestion. Increased retrocardiac density could represent atelectasis or infiltrate. Small BILATERAL effusions. No pneumothorax. COPD. Osteopenia. Previous vertebral augmentation L1. Chronic compression deformities particularly T6 and T9. IMPRESSION: Cardiomegaly, low  lung volumes, COPD, and increased retrocardiac density representing possible atelectasis or infiltrate. Mild vascular congestion. Worsening aeration from priors. Electronically Signed   By: Staci Righter M.D.   On: 01/18/2016 08:42   Dg Abd 2 Views  Result Date: 01/18/2016 CLINICAL DATA:  Abdominal pain EXAM: ABDOMEN - 2 VIEW COMPARISON:  08/22/2014 FINDINGS: Unremarkable bowel gas pattern. No abnormal stool retention or rectal impaction. No concerning mass effect or calcification. No subdiaphragmatic pneumoperitoneum. Bibasilar opacity as evaluated on dedicated chest x-ray performed at the same time. Osteopenia and compression fractures with L1 vertebroplasty. IMPRESSION: Normal bowel gas pattern. Electronically Signed   By: Monte Fantasia M.D.   On: 01/18/2016 08:44    Procedures Procedures (including critical care time)  Medications Ordered in ED Medications  albuterol (PROVENTIL) (2.5 MG/3ML) 0.083% nebulizer solution 5 mg (5 mg Nebulization Given 01/18/16 0744)  sodium chloride 0.9 % bolus 250 mL (0 mLs Intravenous Stopped 01/18/16 0956)     Initial Impression / Assessment and Plan / ED Course  I have reviewed the triage vital signs and the nursing notes.  Pertinent labs & imaging results that were available during my care of the patient were reviewed by me and considered in my medical decision making (see chart for details).  Clinical Course     Patient presented with shortness of breath abdominal pain. Both have been somewhat constant. Has been on antibiotics but unable to get good urine sample here. She does not think it needs recheck. Breathing improved after treatment. Has tolerated some orals. Discussed results with family members. Patient has been dwindling over the last 2 years and there is talk hospice for her and these are probably benefit her. I think inpatient mission would not help at this time. Does not clinically have a pneumonia. Will discharge home to follow-up as  needed.  Final Clinical Impressions(s) / ED Diagnoses   Final diagnoses:  Abdominal pain  Dyspnea, unspecified type  Abdominal pain, unspecified abdominal location    New Prescriptions New Prescriptions   No medications on file     [name], MD 01/18/16 1116

## 2016-01-18 NOTE — ED Notes (Signed)
First attempt for an In and Out Cath failed. No urine returned.

## 2016-01-18 NOTE — ED Notes (Signed)
Bladder scanner result: 218 ml

## 2016-01-18 NOTE — ED Notes (Signed)
Fluid challenge: pt is drinking water

## 2016-01-18 NOTE — Discharge Instructions (Signed)
Follow-up with her doctor and hospice or further treatment of her problems.

## 2016-01-18 NOTE — ED Triage Notes (Signed)
Per EMS: Pt called out for epigastric pain and sob.  Pt had one albuterol tx and solumedrol en route with ems.  Pt 97% on treatment at this time. Pt is currently being treated for UTI. Pt on 2L 02 [place] at home daily.  Pt has labored breathing at this time, alert.

## 2016-01-21 DIAGNOSIS — N184 Chronic kidney disease, stage 4 (severe): Secondary | ICD-10-CM | POA: Diagnosis not present

## 2016-01-21 DIAGNOSIS — E46 Unspecified protein-calorie malnutrition: Secondary | ICD-10-CM | POA: Diagnosis not present

## 2016-01-21 DIAGNOSIS — I10 Essential (primary) hypertension: Secondary | ICD-10-CM | POA: Diagnosis not present

## 2016-01-21 DIAGNOSIS — D509 Iron deficiency anemia, unspecified: Secondary | ICD-10-CM | POA: Diagnosis not present

## 2016-01-21 DIAGNOSIS — E039 Hypothyroidism, unspecified: Secondary | ICD-10-CM | POA: Diagnosis not present

## 2016-01-21 DIAGNOSIS — R1310 Dysphagia, unspecified: Secondary | ICD-10-CM | POA: Diagnosis not present

## 2016-01-21 DIAGNOSIS — I350 Nonrheumatic aortic (valve) stenosis: Secondary | ICD-10-CM | POA: Diagnosis not present

## 2016-01-21 DIAGNOSIS — H3530 Unspecified macular degeneration: Secondary | ICD-10-CM | POA: Diagnosis not present

## 2016-01-21 DIAGNOSIS — J9600 Acute respiratory failure, unspecified whether with hypoxia or hypercapnia: Secondary | ICD-10-CM | POA: Diagnosis not present

## 2016-01-22 DIAGNOSIS — D509 Iron deficiency anemia, unspecified: Secondary | ICD-10-CM | POA: Diagnosis not present

## 2016-01-22 DIAGNOSIS — I350 Nonrheumatic aortic (valve) stenosis: Secondary | ICD-10-CM | POA: Diagnosis not present

## 2016-01-22 DIAGNOSIS — E039 Hypothyroidism, unspecified: Secondary | ICD-10-CM | POA: Diagnosis not present

## 2016-01-22 DIAGNOSIS — H3530 Unspecified macular degeneration: Secondary | ICD-10-CM | POA: Diagnosis not present

## 2016-01-22 DIAGNOSIS — J9600 Acute respiratory failure, unspecified whether with hypoxia or hypercapnia: Secondary | ICD-10-CM | POA: Diagnosis not present

## 2016-01-22 DIAGNOSIS — E46 Unspecified protein-calorie malnutrition: Secondary | ICD-10-CM | POA: Diagnosis not present

## 2016-01-23 DIAGNOSIS — J9600 Acute respiratory failure, unspecified whether with hypoxia or hypercapnia: Secondary | ICD-10-CM | POA: Diagnosis not present

## 2016-01-23 DIAGNOSIS — E039 Hypothyroidism, unspecified: Secondary | ICD-10-CM | POA: Diagnosis not present

## 2016-01-23 DIAGNOSIS — E46 Unspecified protein-calorie malnutrition: Secondary | ICD-10-CM | POA: Diagnosis not present

## 2016-01-23 DIAGNOSIS — D509 Iron deficiency anemia, unspecified: Secondary | ICD-10-CM | POA: Diagnosis not present

## 2016-01-23 DIAGNOSIS — H3530 Unspecified macular degeneration: Secondary | ICD-10-CM | POA: Diagnosis not present

## 2016-01-23 DIAGNOSIS — I350 Nonrheumatic aortic (valve) stenosis: Secondary | ICD-10-CM | POA: Diagnosis not present

## 2016-01-25 DIAGNOSIS — J9600 Acute respiratory failure, unspecified whether with hypoxia or hypercapnia: Secondary | ICD-10-CM | POA: Diagnosis not present

## 2016-01-25 DIAGNOSIS — H3530 Unspecified macular degeneration: Secondary | ICD-10-CM | POA: Diagnosis not present

## 2016-01-25 DIAGNOSIS — E46 Unspecified protein-calorie malnutrition: Secondary | ICD-10-CM | POA: Diagnosis not present

## 2016-01-25 DIAGNOSIS — I350 Nonrheumatic aortic (valve) stenosis: Secondary | ICD-10-CM | POA: Diagnosis not present

## 2016-01-25 DIAGNOSIS — D509 Iron deficiency anemia, unspecified: Secondary | ICD-10-CM | POA: Diagnosis not present

## 2016-01-25 DIAGNOSIS — E039 Hypothyroidism, unspecified: Secondary | ICD-10-CM | POA: Diagnosis not present

## 2016-01-28 DIAGNOSIS — D509 Iron deficiency anemia, unspecified: Secondary | ICD-10-CM | POA: Diagnosis not present

## 2016-01-28 DIAGNOSIS — H3530 Unspecified macular degeneration: Secondary | ICD-10-CM | POA: Diagnosis not present

## 2016-01-28 DIAGNOSIS — R1310 Dysphagia, unspecified: Secondary | ICD-10-CM | POA: Diagnosis not present

## 2016-01-28 DIAGNOSIS — I350 Nonrheumatic aortic (valve) stenosis: Secondary | ICD-10-CM | POA: Diagnosis not present

## 2016-01-28 DIAGNOSIS — I10 Essential (primary) hypertension: Secondary | ICD-10-CM | POA: Diagnosis not present

## 2016-01-28 DIAGNOSIS — E039 Hypothyroidism, unspecified: Secondary | ICD-10-CM | POA: Diagnosis not present

## 2016-01-28 DIAGNOSIS — E46 Unspecified protein-calorie malnutrition: Secondary | ICD-10-CM | POA: Diagnosis not present

## 2016-01-28 DIAGNOSIS — N184 Chronic kidney disease, stage 4 (severe): Secondary | ICD-10-CM | POA: Diagnosis not present

## 2016-01-28 DIAGNOSIS — J9600 Acute respiratory failure, unspecified whether with hypoxia or hypercapnia: Secondary | ICD-10-CM | POA: Diagnosis not present

## 2016-01-29 DIAGNOSIS — E039 Hypothyroidism, unspecified: Secondary | ICD-10-CM | POA: Diagnosis not present

## 2016-01-29 DIAGNOSIS — D509 Iron deficiency anemia, unspecified: Secondary | ICD-10-CM | POA: Diagnosis not present

## 2016-01-29 DIAGNOSIS — J9600 Acute respiratory failure, unspecified whether with hypoxia or hypercapnia: Secondary | ICD-10-CM | POA: Diagnosis not present

## 2016-01-29 DIAGNOSIS — E46 Unspecified protein-calorie malnutrition: Secondary | ICD-10-CM | POA: Diagnosis not present

## 2016-01-29 DIAGNOSIS — I350 Nonrheumatic aortic (valve) stenosis: Secondary | ICD-10-CM | POA: Diagnosis not present

## 2016-01-29 DIAGNOSIS — H3530 Unspecified macular degeneration: Secondary | ICD-10-CM | POA: Diagnosis not present

## 2016-01-30 DIAGNOSIS — H3530 Unspecified macular degeneration: Secondary | ICD-10-CM | POA: Diagnosis not present

## 2016-01-30 DIAGNOSIS — D509 Iron deficiency anemia, unspecified: Secondary | ICD-10-CM | POA: Diagnosis not present

## 2016-01-30 DIAGNOSIS — I350 Nonrheumatic aortic (valve) stenosis: Secondary | ICD-10-CM | POA: Diagnosis not present

## 2016-01-30 DIAGNOSIS — E46 Unspecified protein-calorie malnutrition: Secondary | ICD-10-CM | POA: Diagnosis not present

## 2016-01-30 DIAGNOSIS — J9600 Acute respiratory failure, unspecified whether with hypoxia or hypercapnia: Secondary | ICD-10-CM | POA: Diagnosis not present

## 2016-01-30 DIAGNOSIS — E039 Hypothyroidism, unspecified: Secondary | ICD-10-CM | POA: Diagnosis not present

## 2016-02-01 DIAGNOSIS — I350 Nonrheumatic aortic (valve) stenosis: Secondary | ICD-10-CM | POA: Diagnosis not present

## 2016-02-01 DIAGNOSIS — H3530 Unspecified macular degeneration: Secondary | ICD-10-CM | POA: Diagnosis not present

## 2016-02-01 DIAGNOSIS — D509 Iron deficiency anemia, unspecified: Secondary | ICD-10-CM | POA: Diagnosis not present

## 2016-02-01 DIAGNOSIS — J9600 Acute respiratory failure, unspecified whether with hypoxia or hypercapnia: Secondary | ICD-10-CM | POA: Diagnosis not present

## 2016-02-01 DIAGNOSIS — E46 Unspecified protein-calorie malnutrition: Secondary | ICD-10-CM | POA: Diagnosis not present

## 2016-02-01 DIAGNOSIS — E039 Hypothyroidism, unspecified: Secondary | ICD-10-CM | POA: Diagnosis not present

## 2016-02-02 DIAGNOSIS — D509 Iron deficiency anemia, unspecified: Secondary | ICD-10-CM | POA: Diagnosis not present

## 2016-02-02 DIAGNOSIS — I350 Nonrheumatic aortic (valve) stenosis: Secondary | ICD-10-CM | POA: Diagnosis not present

## 2016-02-02 DIAGNOSIS — J9600 Acute respiratory failure, unspecified whether with hypoxia or hypercapnia: Secondary | ICD-10-CM | POA: Diagnosis not present

## 2016-02-02 DIAGNOSIS — H3530 Unspecified macular degeneration: Secondary | ICD-10-CM | POA: Diagnosis not present

## 2016-02-02 DIAGNOSIS — E039 Hypothyroidism, unspecified: Secondary | ICD-10-CM | POA: Diagnosis not present

## 2016-02-02 DIAGNOSIS — E46 Unspecified protein-calorie malnutrition: Secondary | ICD-10-CM | POA: Diagnosis not present

## 2016-02-03 DIAGNOSIS — H3530 Unspecified macular degeneration: Secondary | ICD-10-CM | POA: Diagnosis not present

## 2016-02-03 DIAGNOSIS — I350 Nonrheumatic aortic (valve) stenosis: Secondary | ICD-10-CM | POA: Diagnosis not present

## 2016-02-03 DIAGNOSIS — E039 Hypothyroidism, unspecified: Secondary | ICD-10-CM | POA: Diagnosis not present

## 2016-02-03 DIAGNOSIS — D509 Iron deficiency anemia, unspecified: Secondary | ICD-10-CM | POA: Diagnosis not present

## 2016-02-03 DIAGNOSIS — J9600 Acute respiratory failure, unspecified whether with hypoxia or hypercapnia: Secondary | ICD-10-CM | POA: Diagnosis not present

## 2016-02-03 DIAGNOSIS — E46 Unspecified protein-calorie malnutrition: Secondary | ICD-10-CM | POA: Diagnosis not present

## 2016-02-25 ENCOUNTER — Other Ambulatory Visit (HOSPITAL_COMMUNITY): Payer: Self-pay

## 2016-02-28 DEATH — deceased

## 2016-12-28 IMAGING — DX DG CHEST 1V PORT
1 series · 1 of 1 positions shown · non-contrast
Comparison: 06/16/2015 and earlier, including CT chest 04/26/2012.

CLINICAL DATA: [AGE] who had a syncopal episode at 11 o'clock
a.m. this morning. Patient still confused.

EXAM:
PORTABLE CHEST 1 VIEW

[chest ap]
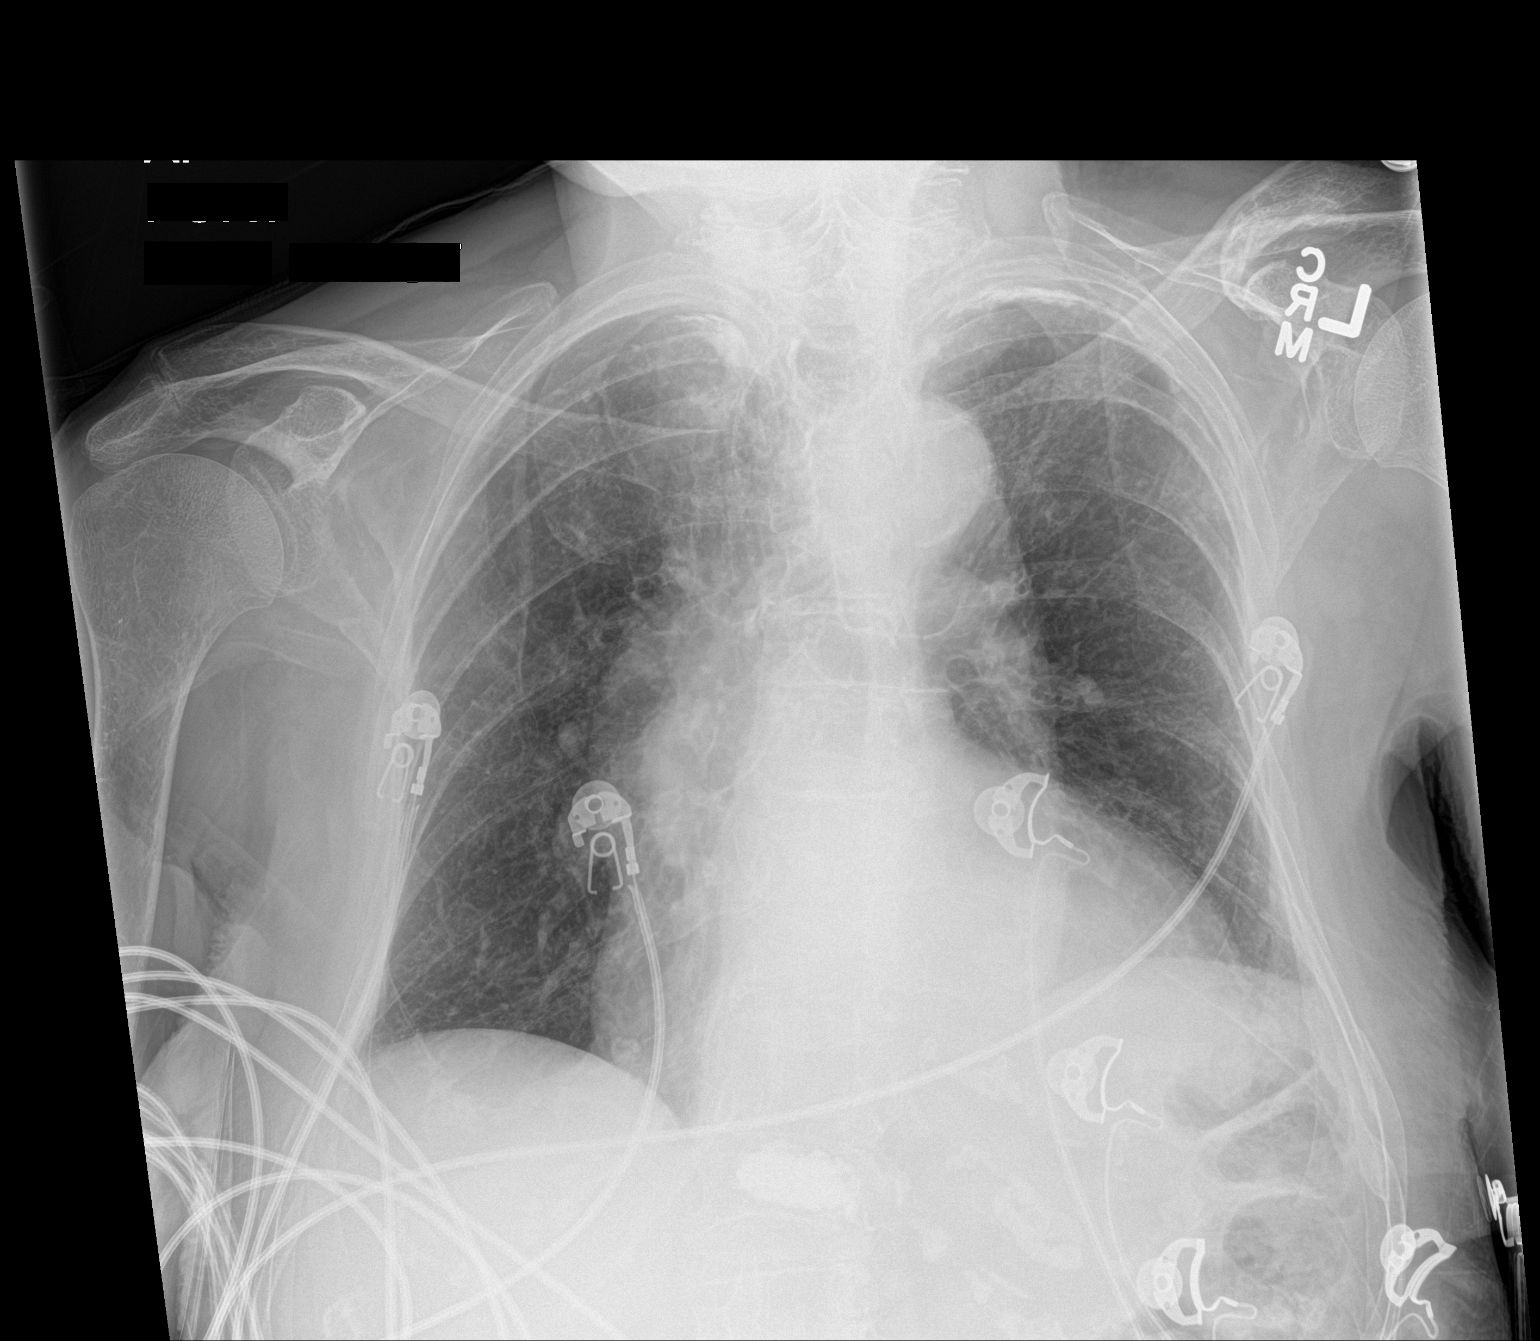

[1 of 1 positions shown; findings below may reference images not displayed]

FINDINGS: Cardiac silhouette moderately to markedly enlarged, unchanged.
Thoracic aorta tortuous, atherosclerotic and ectatic, unchanged.
Hilar and mediastinal contours otherwise unremarkable. Low lung
volumes. Lungs clear. Bronchovascular markings normal. Pulmonary
vascularity normal. No visible pleural effusions.
IMPRESSION: Stable cardiomegaly.  No acute cardiopulmonary disease.

## 2017-07-22 IMAGING — DX DG CHEST 2V
2 series · 2 of 2 positions shown · non-contrast
Comparison: 12/15/2015.

CLINICAL DATA: Shortness of breath.

EXAM:
CHEST  2 VIEW

[chest ap]
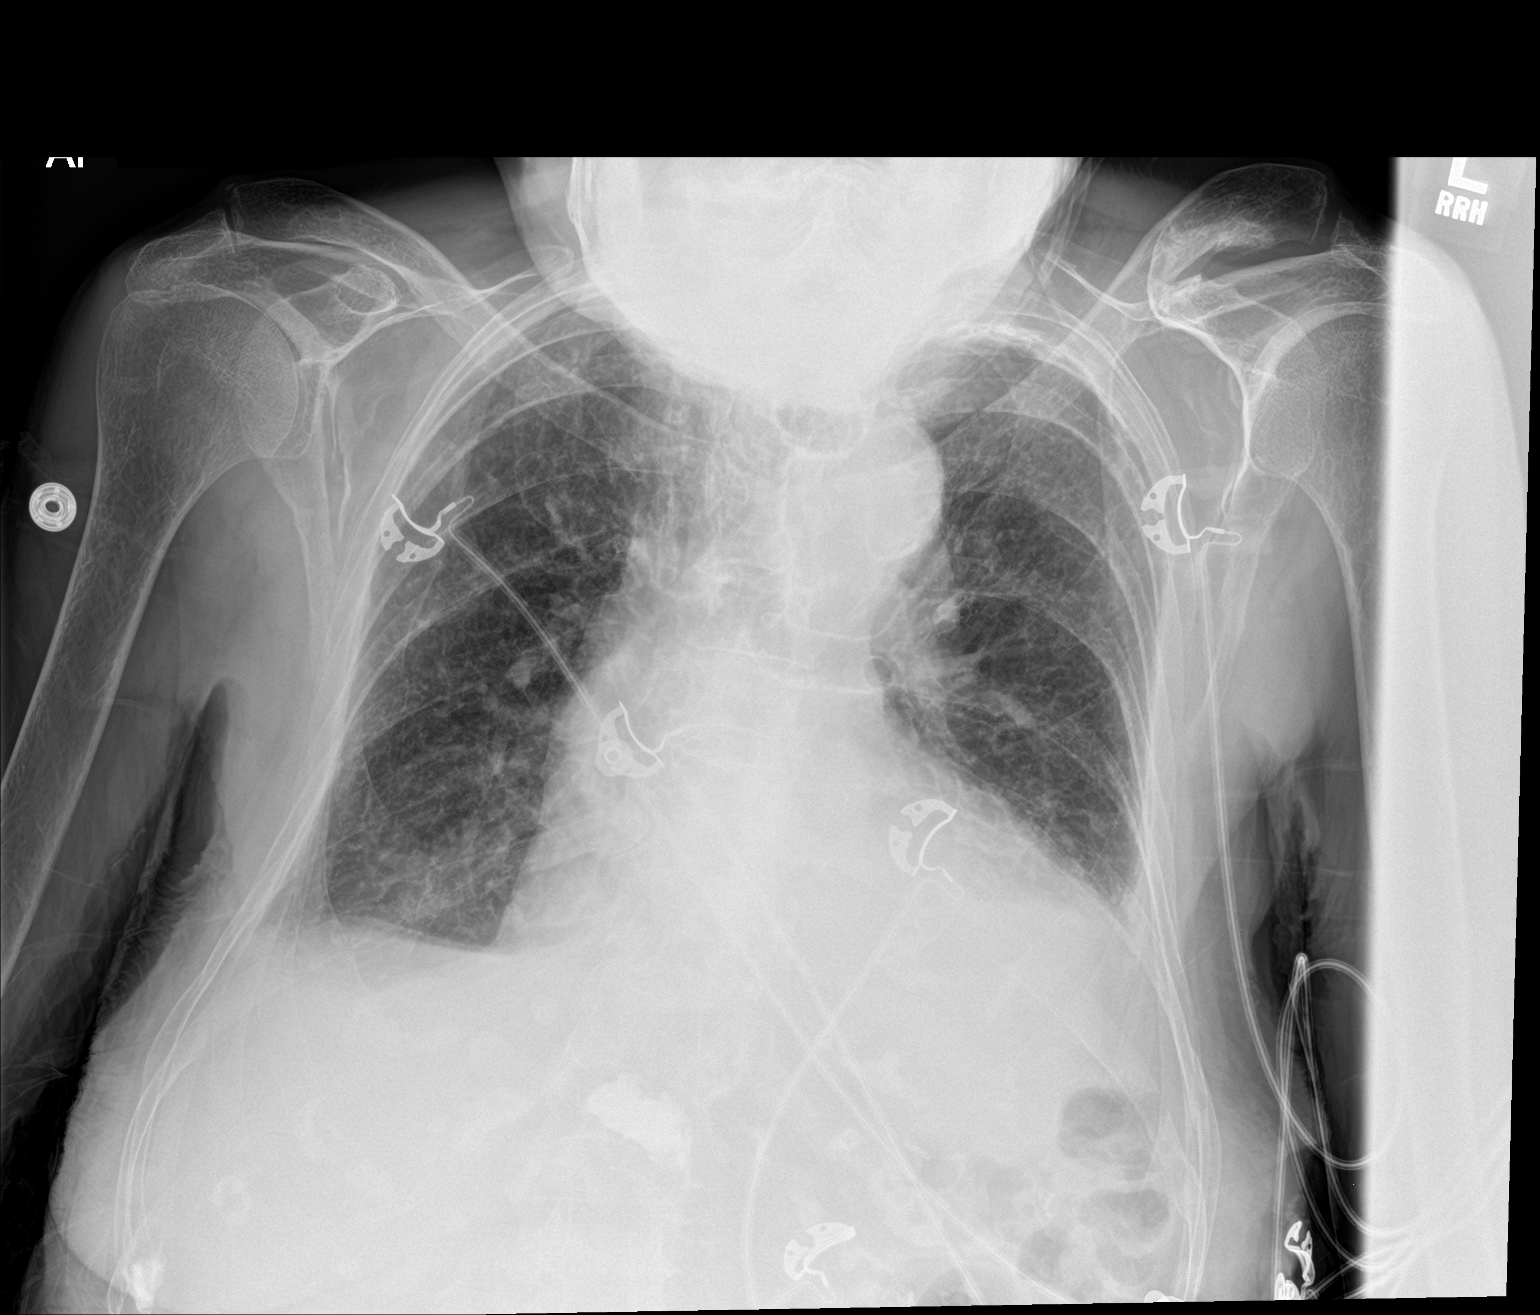

[chest lat]
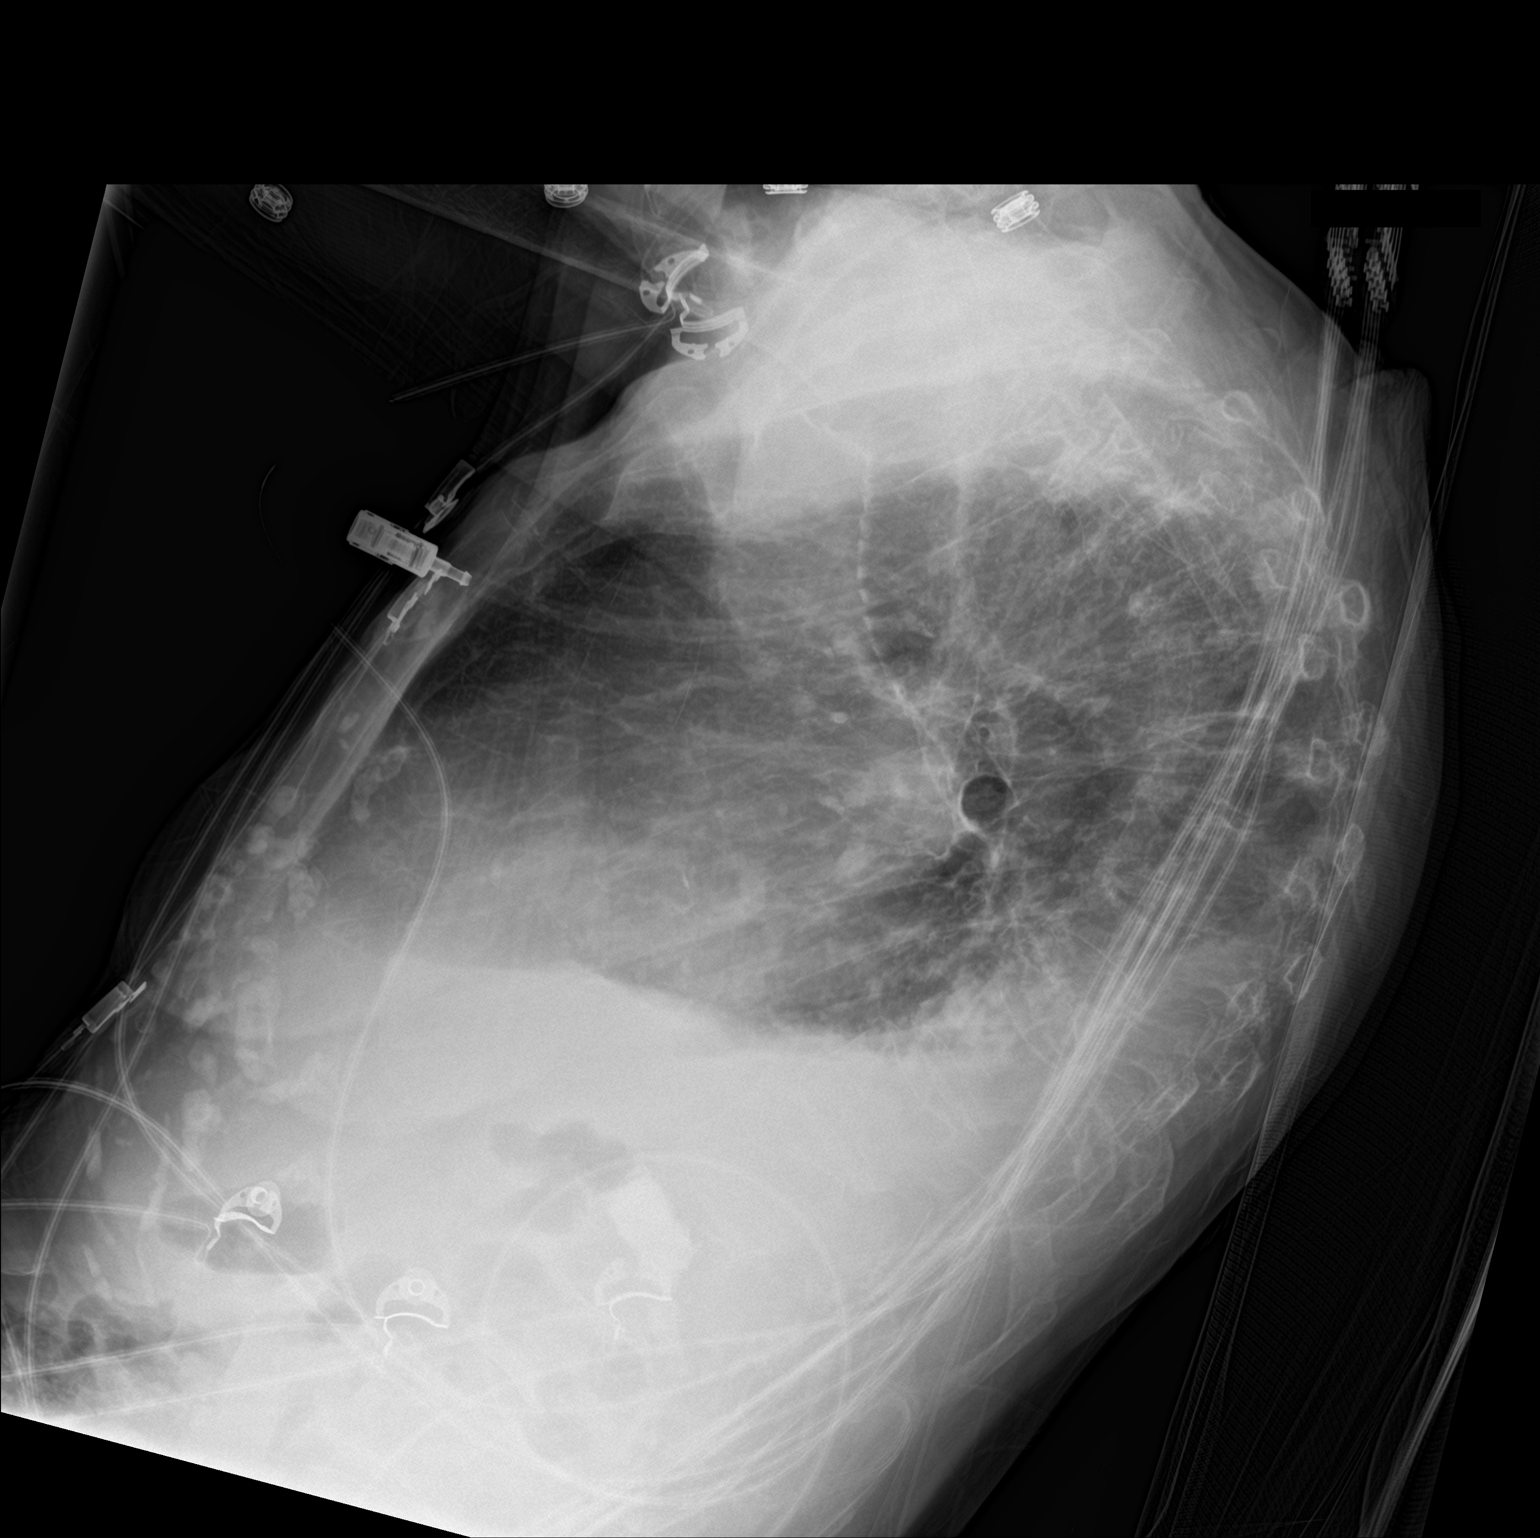

[2 of 2 positions shown; findings below may reference images not displayed]

FINDINGS: The heart is enlarged. Low lung volumes. There is mild vascular
congestion. Increased retrocardiac density could represent
atelectasis or infiltrate. Small BILATERAL effusions. No
pneumothorax. COPD. Osteopenia. Previous vertebral augmentation L1.
Chronic compression deformities particularly T6 and T9.
IMPRESSION: Cardiomegaly, low lung volumes, COPD, and increased retrocardiac
density representing possible atelectasis or infiltrate. Mild
vascular congestion. Worsening aeration from priors.

## 2017-07-22 IMAGING — DX DG ABDOMEN 2V
2 series · 2 of 2 positions shown · non-contrast
Comparison: 08/22/2014

CLINICAL DATA: Abdominal pain

EXAM:
ABDOMEN - 2 VIEW

[abdomen erect]
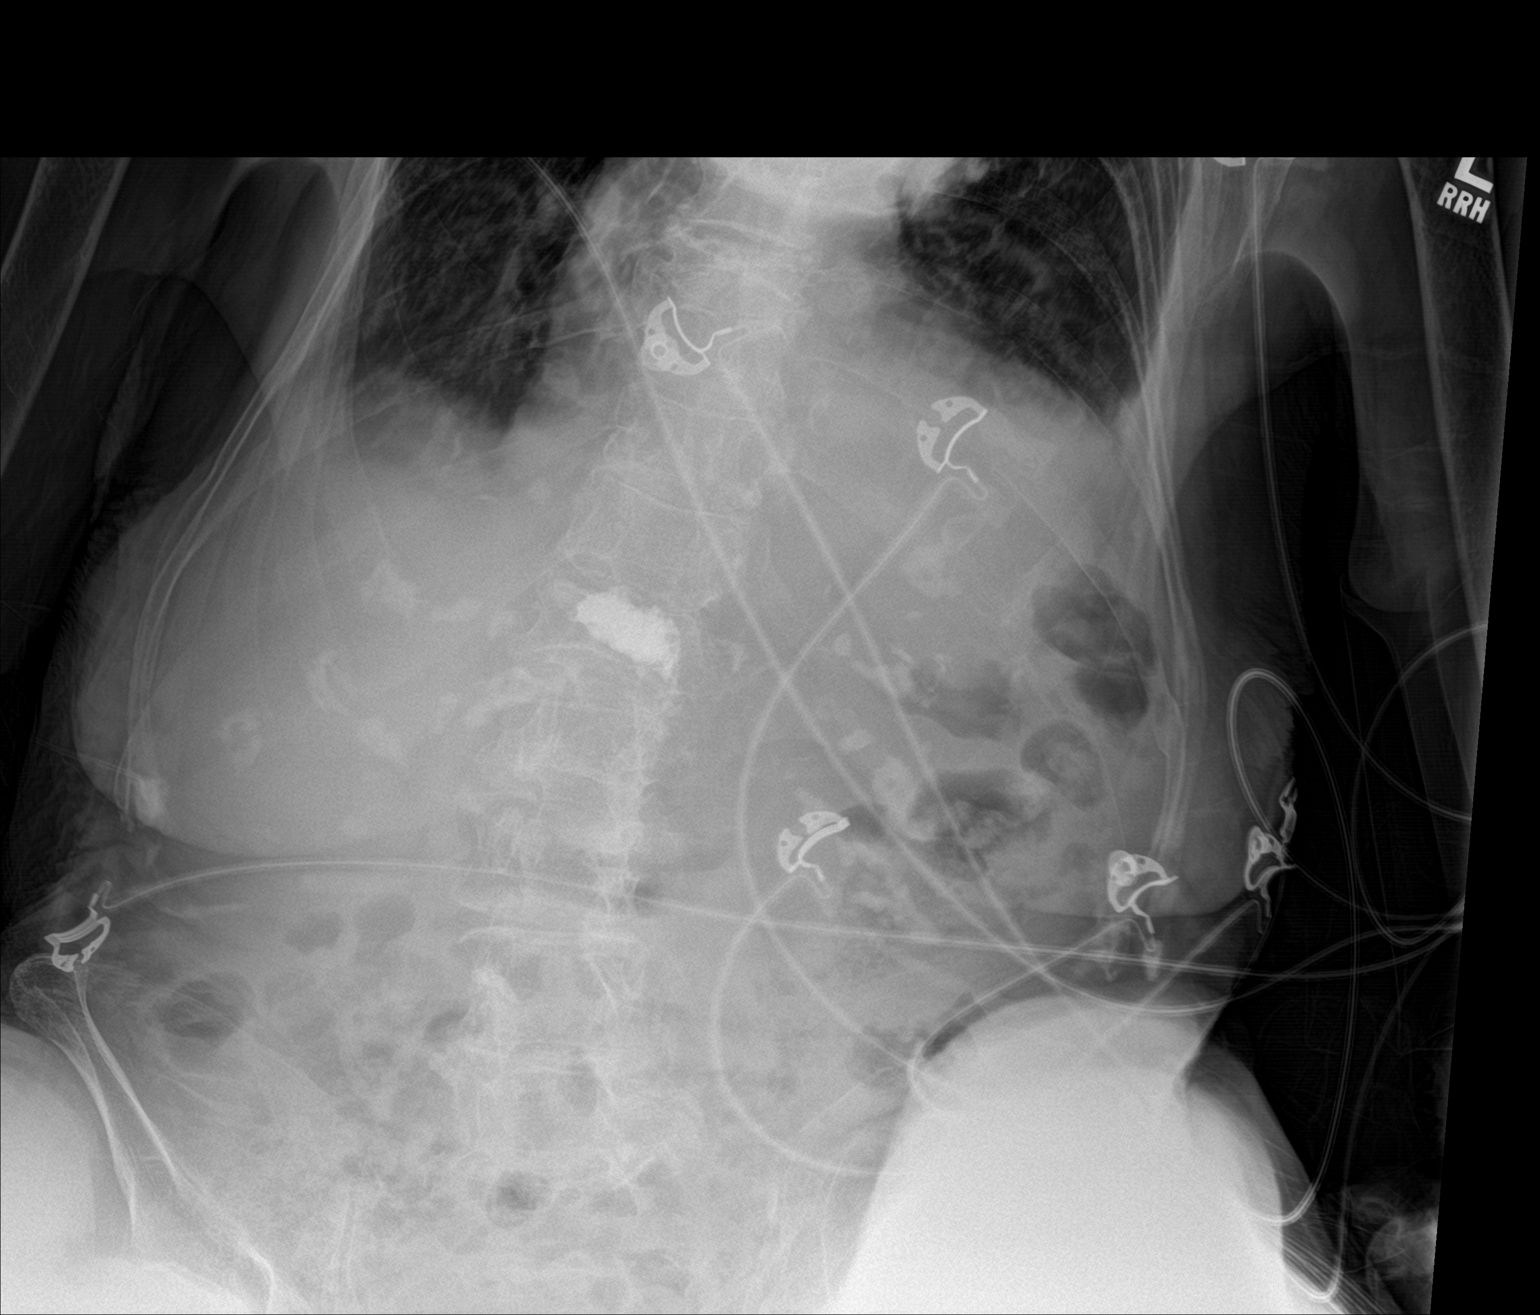

[abdomen supine]
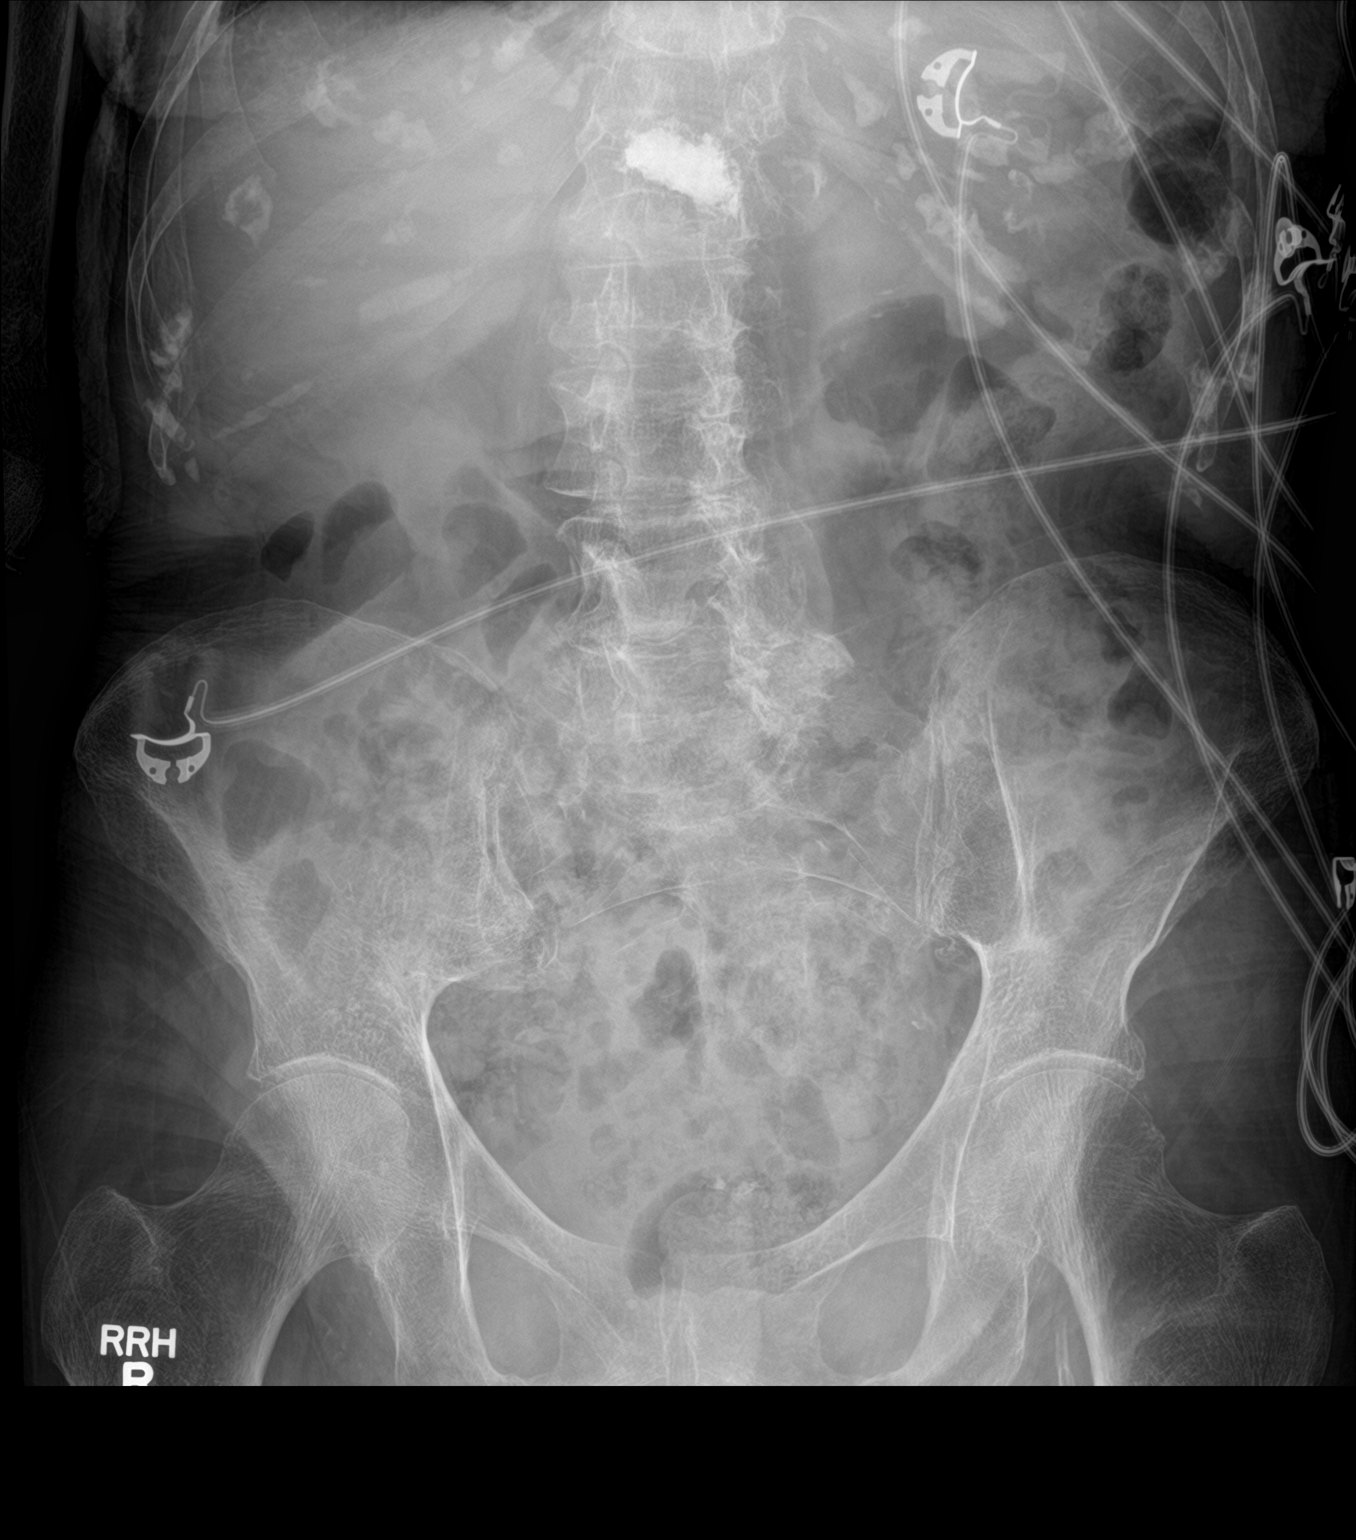

[2 of 2 positions shown; findings below may reference images not displayed]

FINDINGS: Unremarkable bowel gas pattern. No abnormal stool retention or
rectal impaction. No concerning mass effect or calcification. No
subdiaphragmatic pneumoperitoneum.

Bibasilar opacity as evaluated on dedicated chest x-ray performed at
the same time.

Osteopenia and compression fractures with L1 vertebroplasty.
IMPRESSION: Normal bowel gas pattern.
